# Patient Record
Sex: Male | Born: 1965
Health system: Southern US, Community
[De-identification: ages and names within clinical notes are randomized; demographics above are authoritative.]

## PROBLEM LIST (undated history)

## (undated) ENCOUNTER — Emergency Department (HOSPITAL_COMMUNITY): Admission: EM | Payer: 59 | Source: Home / Self Care

## (undated) ENCOUNTER — Emergency Department (HOSPITAL_COMMUNITY): Payer: 59 | Source: Home / Self Care

## (undated) DIAGNOSIS — D869 Sarcoidosis, unspecified: Secondary | ICD-10-CM

## (undated) DIAGNOSIS — T7840XA Allergy, unspecified, initial encounter: Secondary | ICD-10-CM

## (undated) DIAGNOSIS — G4733 Obstructive sleep apnea (adult) (pediatric): Secondary | ICD-10-CM

## (undated) DIAGNOSIS — IMO0002 Reserved for concepts with insufficient information to code with codable children: Secondary | ICD-10-CM

## (undated) DIAGNOSIS — E119 Type 2 diabetes mellitus without complications: Secondary | ICD-10-CM

## (undated) DIAGNOSIS — Q6650 Congenital pes planus, unspecified foot: Secondary | ICD-10-CM

## (undated) DIAGNOSIS — E785 Hyperlipidemia, unspecified: Secondary | ICD-10-CM

## (undated) DIAGNOSIS — I10 Essential (primary) hypertension: Secondary | ICD-10-CM

## (undated) DIAGNOSIS — I499 Cardiac arrhythmia, unspecified: Secondary | ICD-10-CM

## (undated) DIAGNOSIS — J45909 Unspecified asthma, uncomplicated: Secondary | ICD-10-CM

## (undated) DIAGNOSIS — R269 Unspecified abnormalities of gait and mobility: Secondary | ICD-10-CM

## (undated) DIAGNOSIS — Z9989 Dependence on other enabling machines and devices: Secondary | ICD-10-CM

## (undated) DIAGNOSIS — M25569 Pain in unspecified knee: Secondary | ICD-10-CM

## (undated) DIAGNOSIS — M1990 Unspecified osteoarthritis, unspecified site: Secondary | ICD-10-CM

## (undated) DIAGNOSIS — K219 Gastro-esophageal reflux disease without esophagitis: Secondary | ICD-10-CM

## (undated) DIAGNOSIS — D649 Anemia, unspecified: Secondary | ICD-10-CM

## (undated) DIAGNOSIS — G4730 Sleep apnea, unspecified: Secondary | ICD-10-CM

## (undated) HISTORY — DX: Cardiac arrhythmia, unspecified: I49.9

## (undated) HISTORY — DX: Unspecified asthma, uncomplicated: J45.909

## (undated) HISTORY — PX: KNEE SURGERY: SHX244

## (undated) HISTORY — DX: Hyperlipidemia, unspecified: E78.5

## (undated) HISTORY — DX: Anemia, unspecified: D64.9

## (undated) HISTORY — DX: Pain in unspecified knee: M25.569

## (undated) HISTORY — DX: Reserved for concepts with insufficient information to code with codable children: IMO0002

## (undated) HISTORY — DX: Unspecified osteoarthritis, unspecified site: M19.90

## (undated) HISTORY — DX: Obstructive sleep apnea (adult) (pediatric): Z99.89

## (undated) HISTORY — DX: Congenital pes planus, unspecified foot: Q66.50

## (undated) HISTORY — DX: Obstructive sleep apnea (adult) (pediatric): G47.33

## (undated) HISTORY — DX: Sleep apnea, unspecified: G47.30

## (undated) HISTORY — DX: Allergy, unspecified, initial encounter: T78.40XA

## (undated) HISTORY — DX: Sarcoidosis, unspecified: D86.9

## (undated) HISTORY — DX: Essential (primary) hypertension: I10

## (undated) HISTORY — DX: Unspecified abnormalities of gait and mobility: R26.9

## (undated) HISTORY — DX: Gastro-esophageal reflux disease without esophagitis: K21.9

---

## 1983-03-22 HISTORY — PX: NOSE SURGERY: SHX723

## 1999-01-13 ENCOUNTER — Emergency Department (HOSPITAL_COMMUNITY): Admission: EM | Admit: 1999-01-13 | Discharge: 1999-01-13 | Payer: Self-pay | Admitting: Emergency Medicine

## 1999-01-13 ENCOUNTER — Encounter: Payer: Self-pay | Admitting: Emergency Medicine

## 1999-10-24 ENCOUNTER — Emergency Department (HOSPITAL_COMMUNITY): Admission: EM | Admit: 1999-10-24 | Discharge: 1999-10-24 | Payer: Self-pay | Admitting: Emergency Medicine

## 1999-10-24 ENCOUNTER — Encounter: Payer: Self-pay | Admitting: Emergency Medicine

## 1999-10-25 ENCOUNTER — Encounter: Payer: Self-pay | Admitting: Emergency Medicine

## 1999-11-04 ENCOUNTER — Ambulatory Visit (HOSPITAL_COMMUNITY): Admission: RE | Admit: 1999-11-04 | Discharge: 1999-11-04 | Payer: Self-pay | Admitting: Critical Care Medicine

## 1999-11-04 ENCOUNTER — Encounter: Payer: Self-pay | Admitting: Critical Care Medicine

## 1999-11-04 ENCOUNTER — Encounter (INDEPENDENT_AMBULATORY_CARE_PROVIDER_SITE_OTHER): Payer: Self-pay | Admitting: *Deleted

## 1999-11-08 ENCOUNTER — Ambulatory Visit (HOSPITAL_COMMUNITY): Admission: RE | Admit: 1999-11-08 | Discharge: 1999-11-08 | Payer: Self-pay | Admitting: Critical Care Medicine

## 2001-05-21 ENCOUNTER — Ambulatory Visit (HOSPITAL_BASED_OUTPATIENT_CLINIC_OR_DEPARTMENT_OTHER): Admission: RE | Admit: 2001-05-21 | Discharge: 2001-05-21 | Payer: Self-pay | Admitting: Pulmonary Disease

## 2001-12-21 ENCOUNTER — Ambulatory Visit (HOSPITAL_BASED_OUTPATIENT_CLINIC_OR_DEPARTMENT_OTHER): Admission: RE | Admit: 2001-12-21 | Discharge: 2001-12-21 | Payer: Self-pay | Admitting: Critical Care Medicine

## 2002-07-05 ENCOUNTER — Ambulatory Visit (HOSPITAL_COMMUNITY): Admission: RE | Admit: 2002-07-05 | Discharge: 2002-07-05 | Payer: Self-pay | Admitting: Internal Medicine

## 2002-07-05 ENCOUNTER — Encounter: Payer: Self-pay | Admitting: Internal Medicine

## 2004-01-19 ENCOUNTER — Encounter: Admission: RE | Admit: 2004-01-19 | Discharge: 2004-01-19 | Payer: Self-pay | Admitting: Cardiology

## 2004-09-23 ENCOUNTER — Ambulatory Visit: Payer: Self-pay | Admitting: Internal Medicine

## 2005-10-25 ENCOUNTER — Emergency Department (HOSPITAL_COMMUNITY): Admission: EM | Admit: 2005-10-25 | Discharge: 2005-10-25 | Payer: Self-pay | Admitting: Family Medicine

## 2005-11-15 ENCOUNTER — Ambulatory Visit (HOSPITAL_COMMUNITY): Admission: RE | Admit: 2005-11-15 | Discharge: 2005-11-15 | Payer: Self-pay | Admitting: Nephrology

## 2005-11-22 ENCOUNTER — Ambulatory Visit (HOSPITAL_COMMUNITY): Admission: RE | Admit: 2005-11-22 | Discharge: 2005-11-22 | Payer: Self-pay | Admitting: Nephrology

## 2006-07-12 ENCOUNTER — Ambulatory Visit (HOSPITAL_COMMUNITY): Admission: RE | Admit: 2006-07-12 | Discharge: 2006-07-12 | Payer: Self-pay | Admitting: Nephrology

## 2006-08-23 ENCOUNTER — Emergency Department (HOSPITAL_COMMUNITY): Admission: EM | Admit: 2006-08-23 | Discharge: 2006-08-23 | Payer: Self-pay | Admitting: Emergency Medicine

## 2007-01-23 ENCOUNTER — Ambulatory Visit (HOSPITAL_COMMUNITY): Admission: RE | Admit: 2007-01-23 | Discharge: 2007-01-23 | Payer: Self-pay | Admitting: Nephrology

## 2007-09-03 ENCOUNTER — Emergency Department (HOSPITAL_COMMUNITY): Admission: EM | Admit: 2007-09-03 | Discharge: 2007-09-03 | Payer: Self-pay | Admitting: Family Medicine

## 2008-05-30 ENCOUNTER — Ambulatory Visit (HOSPITAL_COMMUNITY): Admission: RE | Admit: 2008-05-30 | Discharge: 2008-05-30 | Payer: Self-pay | Admitting: Nephrology

## 2008-11-13 ENCOUNTER — Emergency Department (HOSPITAL_COMMUNITY): Admission: EM | Admit: 2008-11-13 | Discharge: 2008-11-14 | Payer: Self-pay | Admitting: Emergency Medicine

## 2008-12-26 ENCOUNTER — Ambulatory Visit (HOSPITAL_COMMUNITY): Admission: RE | Admit: 2008-12-26 | Discharge: 2008-12-26 | Payer: Self-pay | Admitting: Nephrology

## 2009-01-15 ENCOUNTER — Ambulatory Visit: Payer: Self-pay | Admitting: Sports Medicine

## 2009-01-15 DIAGNOSIS — M25569 Pain in unspecified knee: Secondary | ICD-10-CM

## 2009-01-15 DIAGNOSIS — Q6650 Congenital pes planus, unspecified foot: Secondary | ICD-10-CM | POA: Insufficient documentation

## 2009-01-15 DIAGNOSIS — R269 Unspecified abnormalities of gait and mobility: Secondary | ICD-10-CM | POA: Insufficient documentation

## 2009-01-15 DIAGNOSIS — G8929 Other chronic pain: Secondary | ICD-10-CM | POA: Insufficient documentation

## 2009-03-12 ENCOUNTER — Emergency Department (HOSPITAL_COMMUNITY): Admission: EM | Admit: 2009-03-12 | Discharge: 2009-03-12 | Payer: Self-pay | Admitting: Emergency Medicine

## 2009-04-28 ENCOUNTER — Ambulatory Visit: Payer: Self-pay | Admitting: Sports Medicine

## 2009-05-04 ENCOUNTER — Encounter: Admission: RE | Admit: 2009-05-04 | Discharge: 2009-05-04 | Payer: Self-pay | Admitting: Sports Medicine

## 2009-05-05 ENCOUNTER — Encounter: Payer: Self-pay | Admitting: Sports Medicine

## 2009-05-05 DIAGNOSIS — IMO0002 Reserved for concepts with insufficient information to code with codable children: Secondary | ICD-10-CM | POA: Insufficient documentation

## 2009-05-11 ENCOUNTER — Inpatient Hospital Stay (HOSPITAL_COMMUNITY): Admission: RE | Admit: 2009-05-11 | Discharge: 2009-05-12 | Payer: Self-pay | Admitting: Orthopedic Surgery

## 2009-05-11 ENCOUNTER — Ambulatory Visit: Payer: Self-pay | Admitting: Internal Medicine

## 2009-06-09 ENCOUNTER — Encounter: Admission: RE | Admit: 2009-06-09 | Discharge: 2009-07-15 | Payer: Self-pay | Admitting: Internal Medicine

## 2009-06-22 ENCOUNTER — Ambulatory Visit: Payer: Self-pay | Admitting: Critical Care Medicine

## 2009-06-22 DIAGNOSIS — D869 Sarcoidosis, unspecified: Secondary | ICD-10-CM

## 2009-06-22 DIAGNOSIS — G4730 Sleep apnea, unspecified: Secondary | ICD-10-CM | POA: Insufficient documentation

## 2009-06-22 DIAGNOSIS — K219 Gastro-esophageal reflux disease without esophagitis: Secondary | ICD-10-CM | POA: Insufficient documentation

## 2009-06-22 DIAGNOSIS — I10 Essential (primary) hypertension: Secondary | ICD-10-CM

## 2009-07-24 ENCOUNTER — Ambulatory Visit: Payer: Self-pay | Admitting: Critical Care Medicine

## 2010-02-17 ENCOUNTER — Telehealth (INDEPENDENT_AMBULATORY_CARE_PROVIDER_SITE_OTHER): Payer: Self-pay | Admitting: *Deleted

## 2010-02-17 ENCOUNTER — Ambulatory Visit: Payer: Self-pay | Admitting: Critical Care Medicine

## 2010-03-30 ENCOUNTER — Ambulatory Visit
Admission: RE | Admit: 2010-03-30 | Discharge: 2010-03-30 | Payer: Self-pay | Source: Home / Self Care | Attending: Critical Care Medicine | Admitting: Critical Care Medicine

## 2010-04-11 ENCOUNTER — Encounter: Payer: Self-pay | Admitting: Sports Medicine

## 2010-04-22 ENCOUNTER — Other Ambulatory Visit: Payer: Self-pay | Admitting: Internal Medicine

## 2010-04-22 ENCOUNTER — Ambulatory Visit: Payer: Self-pay

## 2010-04-22 DIAGNOSIS — R52 Pain, unspecified: Secondary | ICD-10-CM

## 2010-04-22 NOTE — Assessment & Plan Note (Signed)
Summary: NP/KNEE PAIN/KH   Vital Signs:  Patient profile:   45 year old male Height:      67 inches Weight:      193 pounds BMI:     30.34 BP sitting:   126 / 87  Vitals Entered By: Lillia Pauls CMA (January 15, 2009 1:41 PM)  History of Present Illness: Pt presents as a new patient for evaluation of left knee pain that he has had for 2 years, worsening for the past 6 months. No prior injury except for possibly stepping in a hole years ago. Has intermittent medial left knee pain with intermittent effusions and locking. He has used heat, ibuprofen and celebrex which have been mildly helpful through the years. Not using Celebrex at this time. He has difficulty with squatting and pivoting. Has had instances when his leg would lock up and he would have to shake it for 30 seconds or more to unlock it. Has used a knee sleeve in the past which did not help very much. This is decreasing his quality of life. He works in Public affairs consultant at [employer].   Physical Exam  General:  alert, well-developed, and well-nourished.   Head:  normocephalic and atraumatic.   Msk:  Knee: LEFT Normal to inspection with no erythema or effusion or obvious bony abnormalities. Palpation showed medial left joint line tenderness to palpation both anteriorly and posteriorly.  No patellar tenderness or condyle tenderness. ROM normal lacks flexion beyond 105 degrees and terminal extension. Ligaments with solid consistent endpoints including ACL, PCL, LCL, MCL. Positive Mcmurray's and provocative meniscal tests. Non painful patellar compression. Patellar and quadriceps tendons unremarkable. Hamstring and quadriceps strength is normal.   Knee: RIGHT Normal to inspection with no erythema or effusion or obvious bony abnormalities. Palpation normal with no warmth or joint line tenderness or patellar tenderness or condyle tenderness. ROM normal in flexion and extension and lower leg rotation. Ligaments with solid  consistent endpoints including ACL, PCL, LCL, MCL. Negative Mcmurray's and provocative meniscal tests. Non painful patellar compression. Patellar and quadriceps tendons unremarkable. Hamstring and quadriceps strength is normal.   Bilateral pes planus with significant midfoot breakdown and pronation while walking.      Impression & Recommendations:  Problem # 1:  KNEE PAIN, LEFT (ICD-719.46) Assessment New  Likley due to medial meniscus tear. Discussed treatment options with the patient including nothing, conservative with CSI and therapy, or MRI and surgery. Patient chose conservative tx with steroid injection, Don Joy brace and knee stregthening exercises.  Consent obtained and verified. Sterile betadine prep. Furthur cleansed with alcohol. Topical analgesic spray: Ethyl chloride. Joint: Left knee Approached in typical fashion with: laterally Completed without difficulty Meds: 6 cc of 0.5 % marcaine and 1 cc of 40mg  of Kenalog Needle: 22 gauge Aftercare instructions and Red flags advised.  Given straight leg raise exercises to do daily. 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 2)  Do the straight leg raises and the  small bends, 3 sets of 10 working up to 3 sets of 30.  3)  Use the new shoe inserts with the arch pads. 4)  We will see you back in 4-6 weeks. 5)  If you get swelling ice the knee for 20 minutes.

## 2010-04-22 NOTE — Assessment & Plan Note (Signed)
Summary: Pulmonary OV   CC:  1 month follow up.  Pt states breathing has improved since starting on advair.  Pt states he still have an occasional dry cough but states overall its has improved greatly.  Denies wheezing and chest tightness.Collin Lopez  History of Present Illness: Pulmonary OV       This is a 45 year old man who presents for follow-up Pulmonary visit.  The patient presents with cough, but has no history of fever, fatigue, failure to thrive, weight loss, night sweats, nosebleeds, easy bruising, heartburn, nausea/vomiting, unable to take oral fluids/nourishment, chest pain, shortness of breath, shortness of breath with exertion, hemoptysis, joint swelling, leg swelling, peripheral edema, and URI symptoms.  The patient comes in today for follow-up of asthma, bronchitis, and sarcoidosis.  Since the last visit, the patient feels their symptoms are getting worse.  Evaluation since the last visit (see reports for full details) include CXR.    Pt not seen since 2005.  Had L knee surgery 2/11.  arthoscopic surgery.  Pt had complication of negative pressure pulmonary edema postop.   CXR did clear in 24hrs but the pt not same since.  Pt with ongoing chest congestion.  Now has more phlegm to produce.   Pt on cpap 14cmh20.  Jul 24, 2009 2:41 PM Is doing better.  not much cough,  no wheeze,  no new issues. Pt denies any significant sore throat, nasal congestion or excess secretions, fever, chills, sweats, unintended weight loss, pleurtic or exertional chest pain, orthopnea PND, or leg swelling Pt denies any increase in rescue therapy over baseline, denies waking up needing it or having any early am or nocturnal exacerbations of coughing/wheezing/or dyspnea.   Preventive Screening-Counseling & Management  Alcohol-Tobacco     Smoking Status: quit > 6 months  Current Medications (verified): 1)  Nexium 40 Mg Cpdr (Esomeprazole Magnesium) .... Once Daily 2)  Allegra-D 12 Hour 60-120 Mg Xr12h-Tab  (Fexofenadine-Pseudoephedrine) .... Once Daily 3)  Losartan Potassium 50 Mg Tabs (Losartan Potassium) .... Once Daily 4)  Hydrochlorothiazide 25 Mg Tabs (Hydrochlorothiazide) .... Once Daily 5)  Advair Diskus 250-50 Mcg/dose  Misc (Fluticasone-Salmeterol) .... One Puff Twice Daily 6)  Ibuprofen 200 Mg Tabs (Ibuprofen) .... As Directed On Bottle 7)  Proair Hfa 108 (90 Base) Mcg/act Aers (Albuterol Sulfate) .... Take One To Two Puff Every 4 Hours As Needed 8)  Cpap .Collin KitchenMarland KitchenMarland Lopez 14 Cmh20  Allergies (verified): No Known Drug Allergies  Past History:  Past medical, surgical, family and social histories (including risk factors) reviewed, and no changes noted (except as noted below).  Past Medical History: Reviewed history from 06/22/2009 and no changes required. 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 left knee surgery  Family History: Reviewed history from 06/22/2009 and no changes required. Heart disease-father cancer-mother  Social History: Reviewed history from 06/22/2009 and no changes required. Patient states former smoker.  1ppd x 2 years.  Quit in 1990s. 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    Vital Signs:  Patient profile:   45 year old male Height:      67 inches Weight:      188 pounds BMI:     29.55 O2 Sat:      98 % on Room air Temp:     97.8 degrees F oral Pulse rate:   86 / minute BP sitting:   124 / 82  (right arm) Cuff size:   regular  Vitals Entered By: [name] RN (Jul 24, 2009 2:24 PM)  O2 Flow:  Room air  CC: 1 month follow up.  Pt states breathing has improved since starting on advair.  Pt states he still have an occasional dry cough but states overall its has improved greatly.  Denies wheezing and chest tightness. Comments Medications reviewed with patient Daytime contact number verified with patient. [name] RN  Jul 24, 2009 2:24 PM    Physical Exam  Additional Exam:  Gen: Pleasant, well-nourished, in no distress,  normal affect ENT: No lesions,  mouth clear,  oropharynx clear, no postnasal drip Neck: No JVD, no TMG, no carotid bruits Lungs: No use of accessory muscles, no dullness to percussion,  Cardiovascular: RRR, heart sounds normal, no murmur or gallops, no peripheral edema Abdomen: soft and NT, no HSM,  BS normal Musculoskeletal: No deformities, no cyanosis or clubbing Neuro: alert, non focal Skin: Warm, no lesions or rashes    Impression & Recommendations:  Problem # 1:  OBSTRUCTIVE CHRONIC BRONCHITIS WITHOUT EXACERBAT (ICD-491.20) Assessment Improved Obstructive asthma plan cont inhaled meds  Complete Medication List: 1)  Nexium 40 Mg Cpdr (Esomeprazole magnesium) .... Once daily 2)  Allegra-d 12 Hour 60-120 Mg Xr12h-tab (Fexofenadine-pseudoephedrine) .... Once daily 3)  Losartan Potassium 50 Mg Tabs (Losartan potassium) .... Once daily 4)  Hydrochlorothiazide 25 Mg Tabs (Hydrochlorothiazide) .... Once daily 5)  Advair Diskus 250-50 Mcg/dose Misc (Fluticasone-salmeterol) .... One puff twice daily 6)  Ibuprofen 200 Mg Tabs (Ibuprofen) .... As directed on bottle 7)   Proair Hfa 108 (90 Base) Mcg/act Aers (Albuterol sulfate) .... Take one to two puff every 4 hours as needed 8)  Cpap  .Collin KitchenMarland KitchenMarland Lopez 14 cmh20  Other Orders: Est. Patient Level III (21308)  Patient Instructions: 1)  No change in medications 2)  Return 4-5 months

## 2010-04-22 NOTE — Assessment & Plan Note (Signed)
Summary: Pulmonary OV   CC:  Follow up.  Pt states he had surgery on left leg in Feb 2011 and was told he had "fluid on lungs."  Pt c/o cough-prod in the mornings with green mucus and chest congestion x2-3 wks..  History of Present Illness: Pulmonary OV       This is a 45 year old man who presents for follow-up Pulmonary visit.  The patient presents with cough, but has no history of fever, fatigue, failure to thrive, weight loss, night sweats, nosebleeds, easy bruising, heartburn, nausea/vomiting, unable to take oral fluids/nourishment, chest pain, shortness of breath, shortness of breath with exertion, hemoptysis, joint swelling, leg swelling, peripheral edema, and URI symptoms.  The patient comes in today for follow-up of asthma, bronchitis, and sarcoidosis.  Since the last visit, the patient feels their symptoms are getting worse.  Evaluation since the last visit (see reports for full details) include CXR.    Pt not seen since 2005.  Had L knee surgery 2/11.  arthoscopic surgery.  Pt had complication of negative pressure pulmonary edema postop.   CXR did clear in 24hrs but the pt not same since.  Pt with ongoing chest congestion.  Now has more phlegm to produce.   Pt on cpap 14cmh20.    Preventive Screening-Counseling & Management  Alcohol-Tobacco     Smoking Status: quit  Current Medications (verified): 1)  Nexium 40 Mg Cpdr (Esomeprazole Magnesium) .... Once Daily 2)  Allegra-D 12 Hour 60-120 Mg Xr12h-Tab (Fexofenadine-Pseudoephedrine) .... Once Daily 3)  Losartan Potassium 50 Mg Tabs (Losartan Potassium) .... Once Daily 4)  Hydrochlorothiazide 25 Mg Tabs (Hydrochlorothiazide) .... Once Daily 5)  Advair Diskus 100-50 Mcg/dose Aepb (Fluticasone-Salmeterol) .Marland Kitchen.. 1 Puff Two Times A Day 6)  Ibuprofen 200 Mg Tabs (Ibuprofen) .... As Directed On Bottle 7)  Ventolin Hfa 108 (90 Base) Mcg/act Aers (Albuterol Sulfate) .... 2 Puffs Every 4 Hours As Needed  Allergies (verified): No Known  Drug Allergies  Past History:  Past medical, surgical, family and social histories (including risk factors) reviewed for relevance to current acute and chronic problems.  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 Heart disease-father cancer-mother  Social History: Reviewed history and no changes required. Patient states former smoker.  1ppd x 2 years.  Quit in 1990s. 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  The patient denies shortness of breath with activity, shortness of breath at rest, coughing up blood, chest pain, acid heartburn, indigestion, weight change, abdominal pain, difficulty swallowing, sore throat, tooth/dental problems, headaches, sneezing, itching, ear ache, anxiety, depression, hand/feet swelling, rash, and fever.    Vital Signs:  Patient profile:   45 year old male Height:      67 inches Weight:      193.50 pounds BMI:     30.42 O2 Sat:      99 % on Room air Temp:     98.0 degrees F oral Pulse rate:   99 / minute BP sitting:   114 / 70  (left arm) Cuff  size:   large  Vitals Entered By: [name] RN (June 22, 2009 3:59 PM)  O2 Flow:  Room air CC: Follow up.  Pt states he had surgery on left leg in Feb 2011 and was told he had "fluid on lungs."  Pt c/o cough-prod in the mornings with green mucus and chest congestion x2-3 wks. Comments Medications reviewed with patient Daytime  contact number verified with patient. [name] RN  June 22, 2009 4:00 PM    Physical Exam  Additional Exam:  Gen: Pleasant, well-nourished, in no distress,  normal affect ENT: No lesions,  mouth clear,  oropharynx clear, no postnasal drip Neck: No JVD, no TMG, no carotid bruits Lungs: No use of accessory muscles, no dullness to percussion, expir wheeze  Cardiovascular: RRR, heart sounds normal, no murmur or gallops, no peripheral edema Abdomen: soft and NT, no HSM,  BS normal Musculoskeletal: No deformities, no cyanosis or clubbing Neuro: alert, non focal Skin: Warm, no lesions or rashes    Pulmonary Function Test Date: 06/22/2009 4:23 PM Gender: Male  Pre-Spirometry FVC    Value: 2.88 L/min   % Pred: 75.40 % FEV1    Value: 2.42 L     Pred: 3.09 L     % Pred: 78.20 % FEV1/FVC  Value: 83.91 %     % Pred: 103.80 %  Impression & Recommendations:  Problem # 1:  OBSTRUCTIVE CHRONIC BRONCHITIS WITHOUT EXACERBAT (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 Patient Level V (41324) HFA Instruction (40102)  Problem # 2:  PULMONARY SARCOIDOSIS (ICD-135) Assessment: Unchanged stable pulmonary sarcoidosis on cxr and exam no systemic long term steroids  Problem # 3:  SLEEP APNEA (ICD-780.57) Assessment: Unchanged severe sleep apnea plan  cont cpap  Medications Added to Medication List This Visit: 1)  Nexium 40 Mg Cpdr (Esomeprazole magnesium) .... Once daily 2)  Allegra-d 12 Hour 60-120 Mg Xr12h-tab (Fexofenadine-pseudoephedrine) .... Once daily 3)  Losartan Potassium 50 Mg Tabs (Losartan potassium) .... Once daily 4)  Hydrochlorothiazide 25 Mg Tabs (Hydrochlorothiazide) .... Once daily 5)  Advair Diskus 100-50 Mcg/dose Aepb (Fluticasone-salmeterol) .Marland Kitchen.. 1 puff two times a day 6)  Advair Diskus  250-50 Mcg/dose Misc (Fluticasone-salmeterol) .... One puff twice daily 7)  Ibuprofen 200 Mg Tabs (Ibuprofen) .... As directed on bottle 8)  Proair Hfa 108 (90 Base) Mcg/act Aers (Albuterol sulfate) .... Take one to two puff every 4 hours as needed 9)  Ventolin Hfa 108 (90 Base) Mcg/act Aers (Albuterol sulfate) .... 2 puffs every 4 hours as needed 10)  Cpap  .Marland KitchenMarland KitchenMarland Kitchen 14 cmh20 11)  Prednisone 10 Mg Tabs (Prednisone) .... Take as directed 4 each am x3days, 3 x 3days, 2 x 3days, 1 x 3days then stop  Complete Medication List: 1)  Nexium 40 Mg Cpdr (Esomeprazole magnesium) .... Once daily 2)  Allegra-d 12 Hour 60-120 Mg Xr12h-tab (Fexofenadine-pseudoephedrine) .... Once daily 3)  Losartan Potassium 50 Mg Tabs (Losartan potassium) .... Once daily 4)  Hydrochlorothiazide 25 Mg Tabs (Hydrochlorothiazide) .... Once daily 5)  Advair Diskus 250-50 Mcg/dose Misc (Fluticasone-salmeterol) .... One puff twice daily 6)  Ibuprofen 200 Mg Tabs (Ibuprofen) .... As directed on bottle 7)  Proair Hfa 108 (90 Base) Mcg/act Aers (Albuterol sulfate) .... Take one to two puff every 4 hours as needed 8)  Cpap  .Marland KitchenMarland KitchenMarland Kitchen 14 cmh20  Other Orders: Depo- Medrol 80mg  (J1040) Admin of Therapeutic Inj  intramuscular or subcutaneous (04540)  Patient Instructions: 1)  Increase Advair to 250/50 one puff twice daily 2)  A depomedrol injection 80mg  will be given 3)  Ventolin/Proair as needed 4)  Return one month Prescriptions: PROAIR HFA 108 (90 BASE) MCG/ACT AERS (ALBUTEROL SULFATE) Take one to two puff every 4 hours as needed  #1 x 6   Entered and Authorized by:   Storm Frisk MD   Signed by:   Storm Frisk MD on 06/22/2009   Method used:   Electronically to        [facility]* (retail)       [address]       [address]       [place], [place]  [address]       Ph: [phone]       Fax: [phone]   RxID:   [id number] PREDNISONE 10 MG  TABS (PREDNISONE) Take as directed  4 each am x3days, 3 x 3days, 2 x 3days, 1 x 3days then stop  #30 x 0   Entered and Authorized by:   Storm Frisk MD   Signed by:   Storm Frisk MD on 06/22/2009   Method used:   Electronically to        [facility]* (retail)       [address]       [address] Shipping[address]mail[address]       [place], [place]  [address]       Ph: [phone]       Fax: [phone]   RxID:   [id number] ADVAIR DISKUS 250-50 MCG/DOSE  MISC (FLUTICASONE-SALMETEROL) One puff twice daily  #1 x 6   Entered and Authorized by:   Storm Frisk MD   Signed by:   Storm Frisk MD on 06/22/2009   Method used:   Electronically to        [facility]* (retail)       [address]       [address]       [place], [place]  [address]       Ph: [phone]       Fax: [phone]   RxID:   [id number]  Called [facility] and cancelled rx for prednisone per PW. Vernie Murders  June 22, 2009 4:45 PM   Immunization History:  Influenza Immunization History:    Influenza:  historical (01/19/2009)  Pneumovax Immunization History:    Pneumovax:  historical (05/12/2009)    CardioPerfect Spirometry  ID: [id number] Patient: Collin Lopez, Collin Lopez DOB: Dec 10, 1965 Age: 45 Years Old Sex: Male Race: Black Height: 67 Weight: 193.50 Status: Confirmed Recorded: 06/22/2009 4:23 PM  Parameter  Measured Predicted %Predicted FVC     2.88        3.82        75.40 FEV1     2.42        3.09        78.20 FEV1%   83.91        80.83        103.80 PEF    7.27        8.25        88.10   Comments: Mild restriction, no obstruction  Interpretation: Pre: FVC= 2.88L FEV1= 2.42L FEV1%= 83.9% 2.42/2.88 FEV1/FVC (06/22/2009 4:24:56 PM), Mild restriction  Medication Administration  Injection # 1:    Medication: Depo- Medrol 80mg     Diagnosis: PULMONARY SARCOIDOSIS (ICD-135)    Route: IM    Site: LUOQ gluteus    Exp Date: 11/2011    Lot #:  [id number]    Mfr: [employer]    Patient tolerated injection without complications    Given by: [name] RN (June 22, 2009 4:50 PM)  Orders Added: 1)  Spirometry w/Graph [94010] 2)  Est. Patient Level V [04540] 3)  HFA Instruction [98119] 4)  Depo- Medrol 80mg  [J1040] 5)  Admin of Therapeutic Inj  intramuscular or subcutaneous [14782]

## 2010-04-22 NOTE — Assessment & Plan Note (Signed)
Summary: Pulmonary OV   CC:  2 month follow up.  Pt states breathing is much better but still having some wheezing, chest tightness, sinsus pressure, and nonprod cough.  .  History of Present Illness: Pulmonary OV       This is a 45 year old man who presents for follow-up Pulmonary visit.  The patient presents with cough, but has no history of fever, fatigue, failure to thrive, weight loss, night sweats, nosebleeds, easy bruising, heartburn, nausea/vomiting, unable to take oral fluids/nourishment, chest pain, shortness of breath, shortness of breath with exertion, hemoptysis, joint swelling, leg swelling, peripheral edema, and URI symptoms.  The patient comes in today for follow-up of asthma, bronchitis, and sarcoidosis.  Since the last visit, the patient feels their symptoms are getting worse.  Evaluation since the last visit (see reports for full details) include CXR.    Pt not seen since 2005.  Had L knee surgery 2/11.  arthoscopic surgery.  Pt had complication of negative pressure pulmonary edema postop.   CXR did clear in 24hrs but the pt not same since.  Pt with ongoing chest congestion.  Now has more phlegm to produce.   Pt on cpap 14cmh20.  Jul 24, 2009 2:41 PM Is doing better.  not much cough,  no wheeze,  no new issues. Pt denies any significant sore throat, nasal congestion or excess secretions, fever, chills, sweats, unintended weight loss, pleurtic or exertional chest pain, orthopnea PND, or leg swelling Pt denies any increase in rescue therapy over baseline, denies waking up needing it or having any early am or nocturnal exacerbations of coughing/wheezing/or dyspnea. February 17, 2010 3:50 PM Notes more congestion and cough.  ?two weeks ago.  Mucus is green out of mouth and chest .  No real fever, ?some low grade fever before. Pt is sleepy at lunch.  Dyspnea is worse as well. No f/c/s.   Pt denies any significant sore throat, nasal congestion or excess secretions, fever, chills,  sweats, unintended weight loss, pleurtic or exertional chest pain, orthopnea PND, or leg swelling Pt denies any increase in rescue therapy over baseline, denies waking up needing it or having any early am or nocturnal exacerbations of coughing/wheezing/or dyspnea.   March 30, 2010 4:56 PM was given steroids and abx and is better  still with sinus pressure and coughing . notes some wheezing.  mucus is minimal out of the nose notes some dyspnea with exertion.     Current Medications (verified): 1)  Nexium 40 Mg Cpdr (Esomeprazole Magnesium) .... Once Daily 2)  Losartan Potassium 50 Mg Tabs (Losartan Potassium) .... Once Daily 3)  Hydrochlorothiazide 25 Mg Tabs (Hydrochlorothiazide) .... Once Daily 4)  Advair Diskus 250-50 Mcg/dose  Misc (Fluticasone-Salmeterol) .... One Puff Twice Daily 5)  Ibuprofen 200 Mg Tabs (Ibuprofen) .... As Directed On Bottle 6)  Proair Hfa 108 (90 Base) Mcg/act Aers (Albuterol Sulfate) .... Take One To Two Puff Every 4 Hours As Needed 7)  Cpap .Marland KitchenMarland KitchenMarland Kitchen 14 Cmh20 8)  Fluticasone Propionate 50 Mcg/act Susp (Fluticasone Propionate) .... Two Sprays Each Nostril Daily  Allergies (verified): No Known Drug Allergies  Past History:  Past medical, surgical, family and social histories (including risk factors) reviewed, and no changes noted (except as noted below).  Past Medical History: Reviewed history from 06/22/2009 and no changes required. 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 left knee surgery  Family History: Reviewed history from 06/22/2009 and no changes required. Heart disease-father cancer-mother  Social History: Reviewed history from 06/22/2009 and no changes required. Patient states former  smoker.  1ppd x 2 years.  Quit in 1990s. 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  The patient denies shortness of breath at rest, non-productive cough, coughing up blood, chest pain, irregular heartbeats, acid heartburn, indigestion, loss of appetite, weight change, abdominal pain, difficulty swallowing, sore throat, tooth/dental problems, headaches, sneezing, itching, ear ache, anxiety, depression, hand/feet swelling, joint stiffness or pain, rash, change in color of mucus, and fever.    Vital Signs:  Patient profile:   44 year old male Height:      67 inches Weight:      195.25 pounds BMI:     30.69 O2 Sat:      99 % on Room air Temp:     98.3 degrees F oral Pulse rate:   98 / minute BP sitting:   142 / 88  (right arm) Cuff size:   regular  Vitals Entered By: [name] RN (March 30, 2010 4:49 PM)  O2 Flow:  Room air CC: 2 month follow up.  Pt states breathing is much better but still having some wheezing, chest tightness, sinsus pressure, nonprod cough.   Comments Medications reviewed with patient Daytime contact number verified with patient. [name] RN  March 30, 2010 4:51 PM    Physical Exam  Additional Exam:  Gen: Pleasant, well-nourished, in no distress,  normal affect ENT: erythematous nares, no purulence Neck: No JVD, no TMG, no carotid bruits Lungs: No use of accessory muscles, no dullness to percussion,  Cardiovascular: RRR, heart sounds normal, no murmur or gallops, no peripheral edema Abdomen: soft and NT, no HSM,  BS normal Musculoskeletal: No deformities, no cyanosis or clubbing Neuro: alert, non focal Skin: Warm, no lesions or rashes    Impression & Recommendations:  Problem # 1:  ALLERGIC RHINITIS (ICD-477.9) Assessment Unchanged ongoing allergic  rhinitis plan Trial Astepro two sprays each nostril daily Stay on fluticasone two sprays each nostril daily Stay on Advair one puff twice daily Return 3 months The following medications were removed from the medication list:    Allegra Allergy 180 Mg Tabs (Fexofenadine hcl) ..... Hold for 10days then resume one daily His updated medication list for this problem includes:    Fluticasone Propionate 50 Mcg/act Susp (Fluticasone propionate) .Marland Kitchen..Marland Kitchen Two sprays each nostril daily    Astepro 0.15 % Soln (Azelastine hcl) .Marland Kitchen..Marland Kitchen Two sprays each nostril daily  Orders: Est. 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 Patient Level III (02725)  Medications Added to Medication List This Visit: 1)  Astepro 0.15 % Soln (Azelastine hcl) .... Two sprays each nostril daily  Complete Medication List: 1)  Nexium 40 Mg Cpdr (Esomeprazole magnesium) .... Once daily 2)  Losartan Potassium 50 Mg Tabs (Losartan potassium) .... Once daily 3)  Hydrochlorothiazide 25 Mg Tabs (Hydrochlorothiazide) .... Once daily 4)  Advair Diskus 250-50 Mcg/dose Misc (Fluticasone-salmeterol) .... One puff twice daily 5)  Ibuprofen 200 Mg Tabs (Ibuprofen) .... As directed on bottle 6)  Proair Hfa 108 (90 Base) Mcg/act Aers (Albuterol sulfate) .... Take one  to two puff every 4 hours as needed 7)  Cpap  .Marland KitchenMarland KitchenMarland Kitchen 14 cmh20 8)  Fluticasone Propionate 50 Mcg/act Susp (Fluticasone propionate) .... Two sprays each nostril daily 9)  Astepro 0.15 % Soln (Azelastine hcl) .... Two sprays each nostril daily  Patient Instructions: 1)  Trial Astepro two sprays each nostril daily 2)  Stay on fluticasone two sprays each nostril daily 3)  Stay on Advair one puff twice daily 4)  Return 3 months Prescriptions: ASTEPRO 0.15 % SOLN (AZELASTINE HCL) two sprays each nostril daily  #1 x 6   Entered and Authorized by:   Storm Frisk MD    Signed by:   Storm Frisk MD on 03/30/2010   Method used:   Print then Give to Patient   RxID:   [id number]

## 2010-04-22 NOTE — Letter (Signed)
Summary: *Referral Letter  [facility]  [address]   [place], [place] [address]   Phone: [phone]  Fax: [phone]    05/05/2009 Worthy Rancher, MD [facility] Fax: [phone]   Dear Ferne Reus:  Thank you in advance for agreeing to see my patient:  Collin Lopez [address] [place], [place]  [address]  Phone: [phone]  Reason for Referral: large mesnicus tear of left knee  Procedures Requested: arthroscopic surgery if needed in your opinion  Current Medical Problems: see attached records.  I believe you have seen him in the past.  Very nice gentleman who is ready to move ahead with surgery as he got minimal relief with conservative care.   Thank you again for agreeing to see our patient; please contact us if you have any further questions or need additional information.  Sincerely,  Vincent Gros MD

## 2010-04-22 NOTE — Progress Notes (Signed)
Summary: congestion, cough > OV scheduled  Phone Note Call from Patient Call back at [facility] Phone [phone]   Caller: Patient Call For: wright Summary of Call: Pt states PW told him to call today and make appt as a walk-in, pls advise. Initial call taken by: Darletta Moll,  February 17, 2010 9:18 AM  Follow-up for Phone Call        Called, spoke with pt.  He states he saw Dr. Delford Field in the hallway this morning at [facility] and was told by PW to call office for appt.  Pt c/o chest congestion, prod cough, and sweats.  Cough is prod with green mucus with spots of blood.  States it is a small amount of blood, dark red in color.  Sxs started on Sunday.  OV scheduled for today at 3:20 with PW -- pt aware.  Follow-up by: [name] RN,  February 17, 2010 10:25 AM

## 2010-04-22 NOTE — Assessment & Plan Note (Signed)
Summary: KNEE PAIN,MC   Vital Signs:  Patient profile:   45 year old male BP sitting:   137 / 90  Vitals Entered By: Lillia Pauls CMA (April 28, 2009 1:40 PM)  History of Present Illness: 45 yo M here for 4 month f/u of left knee probable meniscal tear.  Patient reports issues with left knee dating back to when he was a teenager. Progressively worse over past 2 1/2 years. + locking and catching Has had swelling as well. Given intraarticular cortisone injection with  ~30% relief of pain in knee only Using donjoy brace for support and compliant with home quad exercises. Pain worse with squatting and pivoting. Inquiring about surgery or another shot.  Physical Exam  General:  alert, well-developed, and well-nourished.   Msk:  L Knee: Normal to inspection with no erythema or effusion or obvious bony abnormalities. Medial left joint line tenderness to palpation .  No patellar tenderness or condyle tenderness. ROM 5-115 degrees. Ligaments with solid consistent endpoints including ACL, PCL, LCL, MCL. Positive Mcmurray's, thessalys, flexion pinch. Non painful patellar compression. Patellar and quadriceps tendons unremarkable. Hamstring and quadriceps strength is normal.    Impression & Recommendations:  Problem # 1:  KNEE PAIN, LEFT (ICD-719.46) Assessment Deteriorated Not improved with conservative treatment and continues to have mechanical symptoms.  Will order MRI and refer to orthopedics - patient was given the name of Dr. Darrelyn Hillock so would like to see him.  Referral placed.  Orders: MRI without Contrast (MRI w/o Contrast) Orthopedic Surgeon Referral (Ortho Surgeon)  Patient Instructions: 1)  Get the MRI of your left knee and follow up with the orthopedic surgeon after this. 2)  You likely have a cartilage tear in your knee that will need to be removed. 3)  Continue doing the leg exercises and wearing the knee brace for stability. 4)  Follow up with [place] here as  needed. 5)  Your appt for the MRI is on saturday, february 12th at 12 noon at [facility] hospital. Register in admitting at 11:30am. [phone] 6)  Your appt for the orthopaedist is on thurs, february 17th at 8:45am with Dr. Darrelyn Hillock at [facility]. [address] [phone]  Appended Document: KNEE PAIN,MC pt is closterfobic and had to reschedule his appt for mri today. new appt is tmrrw (15th) at [facility], [address] location, at 3 pm. left mssg on pts machine with the info

## 2010-04-22 NOTE — Assessment & Plan Note (Signed)
Summary: Pulmonary OV   CC:  Acute Visit.  chest congestion, prod cough, and sweats - onset Sunday.  Mucus is green with small spot of dark red blood.  Marland Kitchen  History of Present Illness: Pulmonary OV       This is a 45 year old man who presents for follow-up Pulmonary visit.  The patient presents with cough, but has no history of fever, fatigue, failure to thrive, weight loss, night sweats, nosebleeds, easy bruising, heartburn, nausea/vomiting, unable to take oral fluids/nourishment, chest pain, shortness of breath, shortness of breath with exertion, hemoptysis, joint swelling, leg swelling, peripheral edema, and URI symptoms.  The patient comes in today for follow-up of asthma, bronchitis, and sarcoidosis.  Since the last visit, the patient feels their symptoms are getting worse.  Evaluation since the last visit (see reports for full details) include CXR.    Pt not seen since 2005.  Had L knee surgery 2/11.  arthoscopic surgery.  Pt had complication of negative pressure pulmonary edema postop.   CXR did clear in 24hrs but the pt not same since.  Pt with ongoing chest congestion.  Now has more phlegm to produce.   Pt on cpap 14cmh20.  Jul 24, 2009 2:41 PM Is doing better.  not much cough,  no wheeze,  no new issues. Pt denies any significant sore throat, nasal congestion or excess secretions, fever, chills, sweats, unintended weight loss, pleurtic or exertional chest pain, orthopnea PND, or leg swelling Pt denies any increase in rescue therapy over baseline, denies waking up needing it or having any early am or nocturnal exacerbations of coughing/wheezing/or dyspnea. February 17, 2010 3:50 PM Notes more congestion and cough.  ?two weeks ago.  Mucus is green out of mouth and chest .  No real fever, ?some low grade fever before. Pt is sleepy at lunch.  Dyspnea is worse as well. No f/c/s.   Pt denies any significant sore throat, nasal congestion or excess secretions, fever, chills, sweats, unintended  weight loss, pleurtic or exertional chest pain, orthopnea PND, or leg swelling Pt denies any increase in rescue therapy over baseline, denies waking up needing it or having any early am or nocturnal exacerbations of coughing/wheezing/or dyspnea.   Asthma History    Initial Asthma Severity Rating:    Age range: 12+ years    Symptoms: daily    Nighttime Awakenings: 3-4/month    Interferes w/ normal activity: some limitations    SABA use (not for EIB): >2 days/week but not >1X/day    Exacerbations requiring oral systemic steroids: 0-1/year    Asthma Severity Assessment: Moderate Persistent   Current Medications (verified): 1)  Nexium 40 Mg Cpdr (Esomeprazole Magnesium) .... Once Daily 2)  Allegra Allergy 180 Mg Tabs (Fexofenadine Hcl) .... Take 1 Tablet By Mouth Once A Day 3)  Losartan Potassium 50 Mg Tabs (Losartan Potassium) .... Once Daily 4)  Hydrochlorothiazide 25 Mg Tabs (Hydrochlorothiazide) .... Once Daily 5)  Advair Diskus 250-50 Mcg/dose  Misc (Fluticasone-Salmeterol) .... One Puff Twice Daily 6)  Ibuprofen 200 Mg Tabs (Ibuprofen) .... As Directed On Bottle 7)  Proair Hfa 108 (90 Base) Mcg/act Aers (Albuterol Sulfate) .... Take One To Two Puff Every 4 Hours As Needed 8)  Cpap .Marland KitchenMarland KitchenMarland Kitchen 14 Cmh20  Allergies (verified): No Known Drug Allergies  Past History:  Past medical, surgical, family and social histories (including risk factors) reviewed, and no changes noted (except as noted below).  Past Medical History: Reviewed history from 06/22/2009 and no changes required.  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 left knee surgery  Family History: Reviewed history from 06/22/2009 and no changes required. Heart  disease-father cancer-mother  Social History: Reviewed history from 06/22/2009 and no changes required. Patient states former smoker.  1ppd x 2 years.  Quit in 1990s. 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  The patient denies shortness of breath at rest, non-productive cough, coughing up blood, chest pain, irregular heartbeats, acid heartburn, indigestion, loss of appetite, weight change, abdominal pain, difficulty swallowing, sore throat, tooth/dental problems, sneezing, itching, ear ache, anxiety, depression, hand/feet swelling, joint stiffness or pain, rash, and fever.    Vital Signs:  Patient profile:   45 year old male Height:      67 inches Weight:      193.50 pounds BMI:     30.42 O2 Sat:      99 % on Room air Temp:     98.3 degrees F oral Pulse rate:   98 / minute BP sitting:   126 / 70  (right arm) Cuff size:   large  Vitals Entered By: [name] RN (February 17, 2010 3:39 PM)  O2 Flow:  Room air CC: Acute Visit.  chest congestion, prod cough, sweats - onset Sunday.  Mucus is green with small spot of dark red blood.   Comments Medications reviewed with patient Daytime contact number verified with patient. [name] RN  February 17, 2010 3:40 PM    Physical Exam  Additional Exam:  Gen: Pleasant, well-nourished, in no distress,  normal affect ENT: purulent nares, postnasl mucus Neck: No JVD, no TMG, no carotid bruits Lungs: No use of accessory muscles, no dullness to percussion,  Cardiovascular: RRR, heart sounds normal, no murmur or gallops, no peripheral edema Abdomen: soft and NT, no HSM,  BS normal Musculoskeletal: No deformities, no cyanosis or clubbing Neuro: alert, non focal Skin: Warm, no lesions or rashes    Impression &  Recommendations:  Problem # 1:  OTHER ACUTE SINUSITIS (ICD-461.8) Assessment Deteriorated acute sinusitis plan depomedrol 120mg  IM augmentin x 7days nasal rinse nasal steroids pulse oral steroids His updated medication list for this problem includes:    Amoxicillin-pot Clavulanate 875-125 Mg Tabs (Amoxicillin-pot clavulanate) ..... One by mouth two times a day    Fluticasone Propionate 50 Mcg/act Susp (Fluticasone propionate) .Marland Kitchen..Marland Kitchen Two sprays each nostril daily  Orders: Est. Patient Level IV (56433) Depo- Medrol 80mg  (J1040) Depo- Medrol 40mg  (J1030) Admin of Therapeutic Inj  intramuscular or subcutaneous (29518)  Medications Added to Medication List This Visit: 1)  Allegra Allergy 180 Mg Tabs (Fexofenadine hcl) .... Take 1 tablet by mouth once a day 2)  Allegra Allergy 180 Mg Tabs (Fexofenadine hcl) .... Hold for 10days then resume one daily 3)  Amoxicillin-pot Clavulanate 875-125 Mg Tabs (Amoxicillin-pot clavulanate) .... One by mouth two times a day 4)  Fluticasone Propionate 50 Mcg/act Susp (Fluticasone propionate) .... Two sprays each nostril daily 5)  Prednisone 10 Mg Tabs (Prednisone) .... Take as directed take 4 daily for two days, then 3 daily for two days, then two daily for two days then one daily for  two days then stop  Complete Medication List: 1)  Nexium 40 Mg Cpdr (Esomeprazole magnesium) .... Once daily 2)  Allegra Allergy 180 Mg Tabs (Fexofenadine hcl) .... Hold for 10days then resume one daily 3)  Losartan Potassium 50 Mg Tabs (Losartan potassium) .... Once daily 4)  Hydrochlorothiazide 25 Mg Tabs (Hydrochlorothiazide) .... Once daily 5)  Advair Diskus 250-50 Mcg/dose Misc (Fluticasone-salmeterol) .... One puff twice daily 6)  Ibuprofen 200 Mg Tabs (Ibuprofen) .... As directed on bottle 7)  Proair Hfa 108 (90 Base) Mcg/act Aers (Albuterol sulfate) .... Take one to two puff every 4 hours as needed 8)  Cpap  .Marland KitchenMarland KitchenMarland Kitchen 14 cmh20 9)  Amoxicillin-pot Clavulanate 875-125 Mg  Tabs (Amoxicillin-pot clavulanate) .... One by mouth two times a day 10)  Fluticasone Propionate 50 Mcg/act Susp (Fluticasone propionate) .... Two sprays each nostril daily 11)  Prednisone 10 Mg Tabs (Prednisone) .... Take as directed take 4 daily for two days, then 3 daily for two days, then two daily for two days then one daily for two days then stop  Patient Instructions: 1)  A depomedrol injection 120mg  IM will be given 2)  Amoxicillin/clavulinic acid one twice daily for 7days 3)  Use NeilMed sinus rinse twice daily for 10days 4)  Use the NeilMed nasal rinse at least daily washing out both nares thoroughly.  Place one packet of Sinus Wash ingredients into the nasal wash bottle then fill to the dotted line with lukewarm tap water.  Lean over the sink and rinse each nostril out thoroughly and avoid letting the rinse go into the throat.   5)  Fluticasone two sprays each nostril daily 6)  Hold allegra for 10days then resume 7)  Prednisone 10mg  Take 4 daily for two days, then 3 daily for two days, then two daily for two days then one daily for two days then stop 8)  Return 2 months or sooner if unimproved Prescriptions: PREDNISONE 10 MG  TABS (PREDNISONE) Take as directed Take 4 daily for two days, then 3 daily for two days, then two daily for two days then one daily for two days then stop  #20 x 0   Entered and Authorized by:   Storm Frisk MD   Signed by:   Storm Frisk MD on 02/17/2010   Method used:   Electronically to        [facility]* (retail)       [address]       [address] Shipping/mailing       [place], [place]  [address]       Ph: [phone]       Fax: [phone]   RxID:   [id number] FLUTICASONE PROPIONATE 50 MCG/ACT SUSP (FLUTICASONE PROPIONATE) Two sprays each nostril daily  #1 x 6   Entered and Authorized by:   Storm Frisk MD   Signed by:   Storm Frisk MD on 02/17/2010   Method used:   Electronically to        [facility]* (retail)       [address]       [address] Shipping/mailing       [place], [place]  [address]       Ph: [phone]       Fax: [phone]   RxID:   [id number] AMOXICILLIN-POT CLAVULANATE 875-125 MG TABS (AMOXICILLIN-POT CLAVULANATE) one by mouth two times a day  #14 x 0  Entered and Authorized by:   Storm Frisk MD   Signed by:   Storm Frisk MD on 02/17/2010   Method used:   Electronically to        [facility]* (retail)       [address]       [address] Shipping[address]mailing       [place], [place]  [address]       Ph: [phone]       Fax: [phone]   RxID:   [id number]     Immunization History:  Influenza Immunization History:    Influenza:  historical (12/19/2009)    Medication Administration  Injection # 1:    Medication: Depo- Medrol 80mg     Diagnosis: OTHER ACUTE SINUSITIS (ICD-461.8)    Route: IM    Site: LUOQ gluteus    Exp Date: 03/19/2010    Lot #: [id number]    Mfr: Teva    Patient tolerated injection without complications    Given by: Renold Genta RCP, LPN (February 17, 2010 4:40 PM)  Injection # 2:    Medication: Depo- Medrol 40mg     Diagnosis: OTHER ACUTE SINUSITIS (ICD-461.8)    Route: IM    Site: LUOQ gluteus    Exp Date: 03/19/2010    Lot #: [id number]    Mfr: Teva    Given by: Renold Genta RCP, LPN (February 17, 2010 4:41 PM)  Orders Added: 1)  Est. Patient Level IV [42595] 2)  Depo- Medrol 80mg  [J1040] 3)  Depo- Medrol 40mg  [J1030] 4)  Admin of Therapeutic Inj  intramuscular or subcutaneous [63875]

## 2010-05-12 ENCOUNTER — Inpatient Hospital Stay (INDEPENDENT_AMBULATORY_CARE_PROVIDER_SITE_OTHER)
Admission: RE | Admit: 2010-05-12 | Discharge: 2010-05-12 | Disposition: A | Discharge status: Home / Self Care | Payer: 59 | Source: Ambulatory Visit | Attending: Family Medicine | Admitting: Family Medicine

## 2010-05-12 DIAGNOSIS — J069 Acute upper respiratory infection, unspecified: Secondary | ICD-10-CM

## 2010-06-10 LAB — COMPREHENSIVE METABOLIC PANEL
ALT: 22 U/L (ref 0–53)
AST: 20 U/L (ref 0–37)
Albumin: 3.7 g/dL (ref 3.5–5.2)
Alkaline Phosphatase: 89 U/L (ref 39–117)
BUN: 11 mg/dL (ref 6–23)
CO2: 29 mEq/L (ref 19–32)
Creatinine, Ser: 1.11 mg/dL (ref 0.4–1.5)
Glucose, Bld: 103 mg/dL — ABNORMAL HIGH (ref 70–99)

## 2010-06-10 LAB — BASIC METABOLIC PANEL
Calcium: 8.7 mg/dL (ref 8.4–10.5)
Calcium: 9 mg/dL (ref 8.4–10.5)
Chloride: 104 mEq/L (ref 96–112)
GFR calc Af Amer: >60 mL/min (ref >60)
GFR calc non Af Amer: >60 mL/min (ref >60)
Glucose, Bld: 118 mg/dL — ABNORMAL HIGH (ref 70–99)
Glucose, Bld: 130 mg/dL — ABNORMAL HIGH (ref 70–99)
Potassium: 3.3 mEq/L — ABNORMAL LOW (ref 3.5–5.1)
Potassium: 3.5 mEq/L (ref 3.5–5.1)
Sodium: 141 mEq/L (ref 135–145)
Sodium: 142 mEq/L (ref 135–145)

## 2010-06-10 LAB — CBC
HCT: 39.7 % (ref 39.0–52.0)
Hemoglobin: 11.2 g/dL — ABNORMAL LOW (ref 13.0–17.0)
Hemoglobin: 11.6 g/dL — ABNORMAL LOW (ref 13.0–17.0)
MCHC: 32.2 g/dL (ref 30.0–36.0)
MCHC: 33.3 g/dL (ref 30.0–36.0)
MCV: 86.5 fL (ref 78.0–100.0)
MCV: 86.7 fL (ref 78.0–100.0)
Platelets: 337 10*3/uL (ref 150–400)
Platelets: 349 10*3/uL (ref 150–400)
RBC: 4.03 MIL/uL — ABNORMAL LOW (ref 4.22–5.81)
RBC: 4.58 MIL/uL (ref 4.22–5.81)
RDW: 15 % (ref 11.5–15.5)
RDW: 15.3 % (ref 11.5–15.5)
WBC: 4.6 10*3/uL (ref 4.0–10.5)

## 2010-06-10 LAB — CARDIAC PANEL(CRET KIN+CKTOT+MB+TROPI)
CK, MB: 1.7 ng/mL (ref 0.3–4.0)
Relative Index: 0.7 (ref 0.0–2.5)
Total CK: 241 U/L — ABNORMAL HIGH (ref 7–232)

## 2010-06-10 LAB — URINALYSIS, ROUTINE W REFLEX MICROSCOPIC
Bilirubin Urine: NEGATIVE
Glucose, UA: NEGATIVE mg/dL
Protein, ur: NEGATIVE mg/dL
Specific Gravity, Urine: 1.022 (ref 1.005–1.030)

## 2010-06-10 LAB — MRSA PCR SCREENING: MRSA by PCR: NEGATIVE

## 2010-06-10 LAB — BRAIN NATRIURETIC PEPTIDE: Pro B Natriuretic peptide (BNP): <30 pg/mL (ref 0.0–100.0)

## 2010-06-10 LAB — SEDIMENTATION RATE: Sed Rate: 13 mm/hr (ref 0–16)

## 2010-06-10 LAB — DIFFERENTIAL
Basophils Relative: 0 % (ref 0–1)
Eosinophils Absolute: 0.1 10*3/uL (ref 0.0–0.7)
Lymphocytes Relative: 11 % — ABNORMAL LOW (ref 12–46)
Lymphs Abs: 1.3 10*3/uL (ref 0.7–4.0)
Monocytes Absolute: 0.5 10*3/uL (ref 0.1–1.0)
Monocytes Relative: 5 % (ref 3–12)
Neutrophils Relative %: 49 % (ref 43–77)
Neutrophils Relative %: 84 % — ABNORMAL HIGH (ref 43–77)

## 2010-06-10 LAB — APTT: aPTT: 30 seconds (ref 24–37)

## 2010-06-10 LAB — PROTIME-INR: INR: 0.95 (ref 0.00–1.49)

## 2010-06-21 LAB — POCT RAPID STREP A (OFFICE): Streptococcus, Group A Screen (Direct): NEGATIVE

## 2010-06-26 LAB — CK TOTAL AND CKMB (NOT AT ARMC)
CK, MB: 1.3 ng/mL (ref 0.3–4.0)
Relative Index: 1.1 (ref 0.0–2.5)
Total CK: 119 U/L (ref 7–232)

## 2010-06-26 LAB — DIFFERENTIAL
Basophils Absolute: 0.1 10*3/uL (ref 0.0–0.1)
Basophils Relative: 2 % — ABNORMAL HIGH (ref 0–1)
Eosinophils Absolute: 0.2 10*3/uL (ref 0.0–0.7)
Monocytes Relative: 11 % (ref 3–12)
Neutro Abs: 1.9 10*3/uL (ref 1.7–7.7)
Neutrophils Relative %: 39 % — ABNORMAL LOW (ref 43–77)

## 2010-06-26 LAB — BASIC METABOLIC PANEL
BUN: 14 mg/dL (ref 6–23)
Calcium: 9.5 mg/dL (ref 8.4–10.5)
Creatinine, Ser: 1.09 mg/dL (ref 0.4–1.5)
GFR calc Af Amer: >60 mL/min (ref >60)
GFR calc non Af Amer: >60 mL/min (ref >60)

## 2010-06-26 LAB — CBC
MCV: 90.2 fL (ref 78.0–100.0)
Platelets: 343 10*3/uL (ref 150–400)
RBC: 3.9 MIL/uL — ABNORMAL LOW (ref 4.22–5.81)
WBC: 4.9 10*3/uL (ref 4.0–10.5)

## 2010-07-21 ENCOUNTER — Encounter: Payer: Self-pay | Admitting: Critical Care Medicine

## 2010-07-23 ENCOUNTER — Ambulatory Visit: Payer: Self-pay | Admitting: Critical Care Medicine

## 2010-07-27 ENCOUNTER — Encounter: Payer: Self-pay | Admitting: Critical Care Medicine

## 2010-07-27 ENCOUNTER — Ambulatory Visit (INDEPENDENT_AMBULATORY_CARE_PROVIDER_SITE_OTHER): Payer: 59 | Admitting: Critical Care Medicine

## 2010-07-27 VITALS — BP 130/78 | HR 82 | Temp 98.2°F | Ht 67.0 in | Wt 190.8 lb

## 2010-07-27 DIAGNOSIS — J449 Chronic obstructive pulmonary disease, unspecified: Secondary | ICD-10-CM

## 2010-07-27 DIAGNOSIS — J4489 Other specified chronic obstructive pulmonary disease: Secondary | ICD-10-CM

## 2010-07-27 DIAGNOSIS — J209 Acute bronchitis, unspecified: Secondary | ICD-10-CM

## 2010-07-27 MED ORDER — PREDNISONE 10 MG PO TABS: ORAL_TABLET | ORAL | Status: DC

## 2010-07-27 MED ORDER — AMOXICILLIN-POT CLAVULANATE 875-125 MG PO TABS: 1.0000 | ORAL_TABLET | Freq: Two times a day (BID) | ORAL | Status: AC

## 2010-07-27 NOTE — Progress Notes (Signed)
Subjective:    Patient ID: Collin Lopez, male    DOB: 1965/09/06, 45 y.o.   MRN: [id number]  HPI  45 y.o. Notes more sneezing and coughing.  Mucus is with pndrip.  Notes some chest pain.  Notes some headaches  Past Medical History  Diagnosis Date  . Sarcoidosis   . Esophageal reflux   . Unspecified essential hypertension   . Tear of medial cartilage or meniscus of knee, current   . Congenital pes planus   . Abnormality of gait   . Pain in joint, lower leg   . OSA on CPAP      Family History  Problem Relation Age of Onset  . Heart disease Father   . Cancer Mother      History   Social History  . Marital Status: Single    Spouse Name: N/A    Number of Children: N/A  . Years of Education: N/A   Occupational History  . Environmental services    Social History Main Topics  . Smoking status: Former Smoker -- 0.2 packs/day for 3 years    Types: Cigarettes    Quit date: 03/21/1993  . Smokeless tobacco: Never Used   Comment: 1ppd x 2 years  . Alcohol Use: Not on file  . Drug Use: Not on file  . Sexually Active: Not on file   Other Topics Concern  . Not on file   Social History Narrative  . No narrative on file     No Known Allergies   Outpatient Prescriptions Prior to Visit  Medication Sig Dispense Refill  . albuterol (PROAIR HFA) 108 (90 BASE) MCG/ACT inhaler Inhale 2 puffs into the lungs every 6 (six) hours as needed.        Marland Kitchen azelastine (ASTELIN) 137 MCG/SPRAY nasal spray 2 sprays by Nasal route 2 (two) times daily. Use in each nostril as directed       . esomeprazole (NEXIUM) 40 MG capsule Take 40 mg by mouth daily before breakfast.        . fluticasone (VERAMYST) 27.5 MCG/SPRAY nasal spray 2 sprays by Nasal route daily.        . Fluticasone-Salmeterol (ADVAIR DISKUS) 250-50 MCG/DOSE AEPB Inhale 1 puff into the lungs every 12 (twelve) hours.        . hydrochlorothiazide 25 MG tablet Take 25 mg by mouth daily.        Marland Kitchen ibuprofen (ADVIL,MOTRIN) 200 MG  tablet Take 200 mg by mouth every 6 (six) hours as needed.        Marland Kitchen losartan (COZAAR) 50 MG tablet Take 50 mg by mouth daily.           Review of Systems Constitutional:   No  weight loss, night sweats,  Fevers, chills, fatigue, lassitude. HEENT:   No headaches,  Difficulty swallowing,  Tooth/dental problems,  Sore throat,                No sneezing, itching, ear ache, nasal congestion, post nasal drip,   CV:  No chest pain,  Orthopnea, PND, swelling in lower extremities, anasarca, dizziness, palpitations  GI  No heartburn, indigestion, abdominal pain, nausea, vomiting, diarrhea, change in bowel habits, loss of appetite  Resp: Notes  shortness of breath with exertion and  at rest.  No excess mucus, notes  productive cough,  No non-productive cough,  No coughing up of blood.  No change in color of mucus.  No wheezing.  No chest wall deformity  Skin: no  rash or lesions.  GU: no dysuria, change in color of urine, no urgency or frequency.  No flank pain.  MS:  No joint pain or swelling.  No decreased range of motion.  No back pain.  Psych:  No change in mood or affect. No depression or anxiety.  No memory loss.     Objective:   Physical Exam Filed Vitals:   07/27/10 0930  BP: 130/78  Pulse: 82  Temp: 98.2 F (36.8 C)  TempSrc: Oral  Height: 5\' 7"  (1.702 m)  Weight: 190 lb 12.8 oz (86.546 kg)  SpO2: 98%    Gen: Pleasant, well-nourished, in no distress,  normal affect  ENT: No lesions,  mouth clear,  oropharynx clear, no postnasal drip  Neck: No JVD, no TMG, no carotid bruits  Lungs: No use of accessory muscles, no dullness to percussion, exp wheeze   Cardiovascular: RRR, heart sounds normal, no murmur or gallops, no peripheral edema  Abdomen: soft and NT, no HSM,  BS normal  Musculoskeletal: No deformities, no cyanosis or clubbing  Neuro: alert, non focal  Skin: Warm, no lesions or rashes        Assessment & Plan:   OBSTRUCTIVE CHRONIC BRONCHITIS WITHOUT  EXACERBAT Asthmatic bronchitis on basis of allergic factors Pulse prednisone    Updated Medication List Outpatient Encounter Prescriptions as of 07/27/2010  Medication Sig Dispense Refill  . albuterol (PROAIR HFA) 108 (90 BASE) MCG/ACT inhaler Inhale 2 puffs into the lungs every 6 (six) hours as needed.        Marland Kitchen azelastine (ASTELIN) 137 MCG/SPRAY nasal spray 2 sprays by Nasal route 2 (two) times daily. Use in each nostril as directed       . esomeprazole (NEXIUM) 40 MG capsule Take 40 mg by mouth daily before breakfast.        . fexofenadine-pseudoephedrine (ALLEGRA-D 24) 180-240 MG per 24 hr tablet Take 1 tablet by mouth daily.        . fluticasone (VERAMYST) 27.5 MCG/SPRAY nasal spray 2 sprays by Nasal route daily.        . Fluticasone-Salmeterol (ADVAIR DISKUS) 250-50 MCG/DOSE AEPB Inhale 1 puff into the lungs every 12 (twelve) hours.        . hydrochlorothiazide 25 MG tablet Take 25 mg by mouth daily.        Marland Kitchen ibuprofen (ADVIL,MOTRIN) 200 MG tablet Take 200 mg by mouth every 6 (six) hours as needed.        Marland Kitchen losartan (COZAAR) 50 MG tablet Take 50 mg by mouth daily.        Marland Kitchen amoxicillin-clavulanate (AUGMENTIN) 875-125 MG per tablet Take 1 tablet by mouth 2 (two) times daily.  20 tablet  0  . predniSONE (DELTASONE) 10 MG tablet Take 4 for three days, 3 for three days, 2 for three days, 1 for three days and stop   30 tablet  0

## 2010-07-27 NOTE — Assessment & Plan Note (Addendum)
Asthmatic bronchitis on basis of allergic factors Pulse prednisone

## 2010-07-27 NOTE — Patient Instructions (Signed)
Pulse prednisone and antibiotic called to pharmacy No other medication changes Return 4 months, sooner if unimproved

## 2010-08-06 NOTE — Op Note (Signed)
[place]  Patient:    Collin Lopez, Collin Lopez                       MRN: [id number] Proc. Date: 11/04/99 Attending:  Charlcie Cradle. Delford Field, M.D. [facility]                           Operative Report  INDICATIONS:  Bilateral hilar adenopathy, evaluate for cause.  OPERATOR:  Charlcie Cradle. Delford Field, M.D. [facility]  ANESTHESIA:  Xylocaine 1% local.  PREOPERATIVE MEDICATIONS: 1. Versed 3 mg IV push. 2. Demerol 30 mg IV push.  PROCEDURE:  The Olympus video bronchoscope was introduced in the right naris. The upper airways were visualized, were unremarkable.  The entire tracheobronchial tree was visualized, revealed no endobronchial lesions consistent with malignancy, however, cobblestone inflammation was seen throughout both airways consistent with the diagnosis of sarcoidosis. Transbronchial biopsies were then obtained from the right lower lobe without difficulty along with bronchial washings.  COMPLICATIONS:  None.  IMPRESSION:  Bilateral hilar adenopathy, evaluate for cause.  RECOMMENDATIONS:  Follow up pathology. DD:  11/04/99 TD:  11/04/99 Job: [id number]

## 2010-08-25 ENCOUNTER — Ambulatory Visit (INDEPENDENT_AMBULATORY_CARE_PROVIDER_SITE_OTHER): Payer: 59 | Admitting: Family Medicine

## 2010-08-25 DIAGNOSIS — M79669 Pain in unspecified lower leg: Secondary | ICD-10-CM

## 2010-08-25 DIAGNOSIS — M25561 Pain in right knee: Secondary | ICD-10-CM

## 2010-08-25 DIAGNOSIS — M79609 Pain in unspecified limb: Secondary | ICD-10-CM

## 2010-08-25 DIAGNOSIS — M25569 Pain in unspecified knee: Secondary | ICD-10-CM

## 2010-08-25 MED ORDER — MELOXICAM 15 MG PO TABS: 15.0000 mg | ORAL_TABLET | Freq: Every day | ORAL | Status: DC

## 2010-08-25 NOTE — Progress Notes (Signed)
  Subjective:    Patient ID: Collin Lopez, male    DOB: 10/04/1965, 45 y.o.   MRN: [id number]  HPI 45 year old male here for right knee and lower leg pain. He is previously been seen in our clinic a couple years ago for left knee pain and eventually found to have a meniscal tear that was debrided by Dr. Bryson Ha.  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 He states since then he is having worsening right knee pain but more significantly lower leg and shin pain. He does recall one incident 2 months ago where he took her difficult step down and had a little twinge his knee, but he denies having a swollen knee or an obvious pop. Since then he has had mostly pain in the knee and lower leg, but really denies any true instability, locking, or catching. He really relates the worsening pain to the change in his job status with moving heavy amounts of garbage. Occasionally it'll keep him up at night.  Review of Systems Denies fever, sweats, chills, weight loss    Objective:   Physical Exam Gen. appearance: Well-appearing male in no distress The right hip: Full range of motion without pain Back negative seated and supine straight leg raise Right knee has some mild medial joint line tenderness to palpation, but no obvious palpable extruded meniscus. No effusion. He has full range of motion. His ligaments are all intact. Really has a negative McMurray's, even though he does have a small click but it is not painful. He is mildly tender diffusely over the knee. Negative patellar apprehension and grind test. Right lower leg: He has a mild diffuse tenderness all along his tibia and anterior tibialis muscle. No obvious swelling or erythema. He has no calf tenderness. He has no significant point tenderness. Neurovascularly he is intact distally Feet: He has severe pes planus and  overpronates whenever he walks. He also significant midfoot breakdown.  MSK ultrasound right knee: Normal patellar and quad tendons. No effusion noted. Medial knee view shows normal meniscus and MCL. Lateral view shows normal LCL and meniscus.  MSK [place] of his right tibia. Shows no obvious cortical irregularity or edema along the shaft either in the long or transverse views.       Assessment & Plan:  Right knee and leg pain without clear etiology. He does have significant pes planus and overpronation causing gait abnormality, and I think is likely predisposing him to worsening proximal right leg pain. I think his increased activity at his job is also a factor. I think his right lower leg pain is really what is most concerning to him, I think is almost getting a job related shin splint syndrome, but I have low suspicion for an actual stress fracture. I also don't think he has any type of compartment syndrome or artery or nerve entrapment. -X-rays of his knee and tib-fib to rule out arthritis or any other pathology that I missed on ultrasound -Trial of Mobic daily with food -I think custom orthotics will be of most benefit to him. Thus I will see him back in one to 2 weeks at which point we will go over his x-rays and make him some custom orthotics.

## 2010-08-26 ENCOUNTER — Ambulatory Visit (HOSPITAL_COMMUNITY)
Admission: RE | Admit: 2010-08-26 | Discharge: 2010-08-26 | Disposition: A | Discharge status: Home / Self Care | Payer: 59 | Source: Ambulatory Visit | Attending: Family Medicine | Admitting: Family Medicine

## 2010-08-26 DIAGNOSIS — M25561 Pain in right knee: Secondary | ICD-10-CM

## 2010-08-26 DIAGNOSIS — M7730 Calcaneal spur, unspecified foot: Secondary | ICD-10-CM | POA: Insufficient documentation

## 2010-08-26 DIAGNOSIS — M79669 Pain in unspecified lower leg: Secondary | ICD-10-CM

## 2010-08-26 DIAGNOSIS — M79609 Pain in unspecified limb: Secondary | ICD-10-CM | POA: Insufficient documentation

## 2010-08-27 ENCOUNTER — Ambulatory Visit: Payer: 59 | Admitting: Family Medicine

## 2010-09-03 ENCOUNTER — Ambulatory Visit (INDEPENDENT_AMBULATORY_CARE_PROVIDER_SITE_OTHER): Payer: 59 | Admitting: Family Medicine

## 2010-09-03 DIAGNOSIS — M79609 Pain in unspecified limb: Secondary | ICD-10-CM

## 2010-09-03 DIAGNOSIS — M25561 Pain in right knee: Secondary | ICD-10-CM

## 2010-09-03 DIAGNOSIS — M25569 Pain in unspecified knee: Secondary | ICD-10-CM

## 2010-09-03 DIAGNOSIS — R269 Unspecified abnormalities of gait and mobility: Secondary | ICD-10-CM

## 2010-09-03 DIAGNOSIS — M79669 Pain in unspecified lower leg: Secondary | ICD-10-CM

## 2010-09-03 DIAGNOSIS — Q6650 Congenital pes planus, unspecified foot: Secondary | ICD-10-CM

## 2010-09-03 NOTE — Progress Notes (Signed)
  Subjective:    Patient ID: Collin Lopez, male    DOB: 1965-09-18, 45 y.o.   MRN: [id number]  HPI 45 year old male followup on right knee and leg pain. He got x-rays of the both that were normal. 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 I still think orthotics would be very beneficial for him given his poor foot structure. And he would like to get those today.   Review of Systems Denies fever, sweats, chills, weight loss    Objective:   Physical Exam Gen. Appearance: Appropriate male no distress Right knee: Full range of motion without effusion. No medial or lateral joint line tenderness. Right tibia: Minimal tenderness along the middle to distal third of his tibia, since last exam. Bilateral feet tall and significant pes planus with transverse arch breakdown in mild to moderate bunion of his great toes. He also has over pronation and midfoot breakdown. Gait: Bilateral overpronation.       Assessment & Plan:  Right knee and tibia pain I think is related to his foot structure. X-rays and prior ultrasound were normal. - Job modification as he can obtain with his supervisor - Custom orthotics as below  Bilateral foot pain with significant gait abnormality pes planus, overpronation, in bunion -Custom orthotics -Followup as need  Patient was fitted for a : standard, cushioned, semi-rigid orthotic. The orthotic was heated and afterward the patient stood on the orthotic blank positioned on the orthotic stand. The patient was positioned in subtalar neutral position and 10 degrees of ankle dorsiflexion in a weight bearing stance. After completion of molding, a stable base was applied to the orthotic blank. The blank was ground to a stable position for weight bearing. Size: 10 blue swirl Base: EVA Posting: 1st ray post on Lt, double layer 1st ray post on right Additional orthotic padding:none  Tried these prior to  leaving and these helped correct overpronation.  Spent greater than 30 minutes with the patient, over 50% spent on counseling on diagnosis, prognosis, and treatment as well as making of custom orthotics.

## 2011-01-30 ENCOUNTER — Emergency Department (HOSPITAL_COMMUNITY)
Admission: EM | Admit: 2011-01-30 | Discharge: 2011-01-30 | Disposition: A | Discharge status: Nursing Home (Includes ICF) | Payer: 59 | Source: Home / Self Care | Attending: Emergency Medicine | Admitting: Emergency Medicine

## 2011-01-30 ENCOUNTER — Encounter (HOSPITAL_COMMUNITY): Payer: Self-pay | Admitting: *Deleted

## 2011-01-30 ENCOUNTER — Emergency Department (HOSPITAL_COMMUNITY): Payer: 59

## 2011-01-30 ENCOUNTER — Emergency Department (HOSPITAL_COMMUNITY)
Admission: EM | Admit: 2011-01-30 | Discharge: 2011-01-30 | Disposition: A | Discharge status: Home / Self Care | Payer: 59 | Attending: Emergency Medicine | Admitting: Emergency Medicine

## 2011-01-30 DIAGNOSIS — R109 Unspecified abdominal pain: Secondary | ICD-10-CM | POA: Insufficient documentation

## 2011-01-30 DIAGNOSIS — Z79899 Other long term (current) drug therapy: Secondary | ICD-10-CM | POA: Insufficient documentation

## 2011-01-30 DIAGNOSIS — M545 Low back pain, unspecified: Secondary | ICD-10-CM | POA: Insufficient documentation

## 2011-01-30 DIAGNOSIS — D869 Sarcoidosis, unspecified: Secondary | ICD-10-CM | POA: Insufficient documentation

## 2011-01-30 DIAGNOSIS — I10 Essential (primary) hypertension: Secondary | ICD-10-CM | POA: Insufficient documentation

## 2011-01-30 DIAGNOSIS — G4733 Obstructive sleep apnea (adult) (pediatric): Secondary | ICD-10-CM | POA: Insufficient documentation

## 2011-01-30 DIAGNOSIS — K219 Gastro-esophageal reflux disease without esophagitis: Secondary | ICD-10-CM | POA: Insufficient documentation

## 2011-01-30 LAB — COMPREHENSIVE METABOLIC PANEL
AST: 17 U/L (ref 0–37)
Albumin: 3.6 g/dL (ref 3.5–5.2)
BUN: 14 mg/dL (ref 6–23)
Creatinine, Ser: 1.09 mg/dL (ref 0.50–1.35)
Potassium: 3.2 mEq/L — ABNORMAL LOW (ref 3.5–5.1)
Total Protein: 6.9 g/dL (ref 6.0–8.3)

## 2011-01-30 LAB — POCT URINALYSIS DIP (DEVICE)
Glucose, UA: NEGATIVE mg/dL
Hgb urine dipstick: NEGATIVE
Leukocytes, UA: NEGATIVE
Nitrite: NEGATIVE
Urobilinogen, UA: 1 mg/dL (ref 0.0–1.0)
pH: 6 (ref 5.0–8.0)

## 2011-01-30 MED ORDER — CYCLOBENZAPRINE HCL 10 MG PO TABS: 10.0000 mg | ORAL_TABLET | Freq: Two times a day (BID) | ORAL | Status: AC | PRN

## 2011-01-30 MED ORDER — HYDROCODONE-ACETAMINOPHEN 5-500 MG PO TABS: 1.0000 | ORAL_TABLET | Freq: Four times a day (QID) | ORAL | Status: AC | PRN

## 2011-01-30 MED ORDER — POTASSIUM CHLORIDE CRYS ER 20 MEQ PO TBCR
40.0000 meq | EXTENDED_RELEASE_TABLET | Freq: Once | ORAL | Status: AC
  Administered 2011-01-30: 40 meq via ORAL
  Filled 2011-01-30: qty 2

## 2011-01-30 NOTE — ED Notes (Addendum)
Seen at [facility] earlier,  Urine obtained there, sent here, C/o back pain, onset ~ 2 weeks ago, R lower back pain, also a little pain in R groin, sharp, fluctuates, some relief with ibuprofen, (denies: nvd, fever, loss of control of bowel or bladder), mentions some possible radiation down leg, "thinks it might be a pulled muscle", also mentions has had urinary frequency and urgency, denies incontinance.no meds PTA.

## 2011-01-30 NOTE — ED Provider Notes (Addendum)
History     CSN: [id number] Arrival date & time: 01/30/2011  6:08 PM   First MD Initiated Contact with Patient 01/30/11 1658      Chief Complaint  Patient presents with  . Flank Pain    HPI Comments: Been having pain for about 2 weeks, it been hurting bad since i started hurting again , the pain has been shooting down to my groin., also with urinary sx's" liek at times feel like i need to rush to urinate" "almost urinate on me". No fevers No diarrheas  Patient is a 45 y.o. male presenting with flank pain.  Flank Pain The current episode started more than 1 week ago. The problem occurs constantly. Associated symptoms include abdominal pain. Pertinent negatives include no chest pain, no headaches and no shortness of breath. The symptoms are aggravated by bending and twisting. The symptoms are relieved by nothing. He has tried a cold compress for the symptoms. The treatment provided no relief.    Past Medical History  Diagnosis Date  . Sarcoidosis   . Esophageal reflux   . Unspecified essential hypertension   . Tear of medial cartilage or meniscus of knee, current   . Congenital pes planus   . Abnormality of gait   . Pain in joint, lower leg   . OSA on CPAP     Past Surgical History  Procedure Date  . Knee surgery     Family History  Problem Relation Age of Onset  . Heart disease Father   . Cancer Mother     History  Substance Use Topics  . Smoking status: Former Smoker -- 0.2 packs/day for 3 years    Types: Cigarettes    Quit date: 03/21/1993  . Smokeless tobacco: Never Used   Comment: 1ppd x 2 years  . Alcohol Use: No      Review of Systems  Respiratory: Negative for shortness of breath.   Cardiovascular: Negative for chest pain.  Gastrointestinal: Positive for abdominal pain.  Genitourinary: Positive for flank pain.  Neurological: Negative for headaches.    Allergies  Review of patient's allergies indicates no known allergies.  Home Medications    Current Outpatient Rx  Name Route Sig Dispense Refill  . ALBUTEROL SULFATE HFA 108 (90 BASE) MCG/ACT IN AERS Inhalation Inhale 2 puffs into the lungs every 6 (six) hours as needed.      . AZELASTINE HCL 137 MCG/SPRAY NA SOLN Nasal 2 sprays by Nasal route 2 (two) times daily. Use in each nostril as directed     . ESOMEPRAZOLE MAGNESIUM 40 MG PO CPDR Oral Take 40 mg by mouth daily before breakfast.      . FLUTICASONE FUROATE 27.5 MCG/SPRAY NA SUSP Nasal 2 sprays by Nasal route daily.      Marland Kitchen FLUTICASONE-SALMETEROL 250-50 MCG/DOSE IN AEPB Inhalation Inhale 1 puff into the lungs every 12 (twelve) hours.      Marland Kitchen HYDROCHLOROTHIAZIDE 25 MG PO TABS Oral Take 25 mg by mouth daily.      . IBUPROFEN 200 MG PO TABS Oral Take 200 mg by mouth every 6 (six) hours as needed.      Marland Kitchen LOSARTAN POTASSIUM 50 MG PO TABS Oral Take 50 mg by mouth daily.        BP 154/98  Pulse 91  Temp(Src) 98.2 F (36.8 C) (Oral)  Resp 18  SpO2 100%  Physical Exam  ED Course  Procedures (including critical care time)  Labs Reviewed  POCT URINALYSIS DIP (DEVICE) -  Abnormal; Notable for the following:    Ketones, ur TRACE (*)    All other components within normal limits  POCT URINALYSIS DIPSTICK   No results found.   1. Abdominal  pain, other specified site   2. Flank pain       MDM  2 weeks- with flank and RUQ pian with urinary symptoms        Jimmie Molly, MD 01/30/11 Windell Moment  Jimmie Molly, MD 01/30/11 [phone]

## 2011-01-30 NOTE — ED Notes (Signed)
Started with right flank pain approx 2 wks ago; felt he had been constipated and may have been aggravated by active job.  Had 1 wk off work, with pain easing some, but as soon as pt became active it became worse again.  Pain now sharp, stabbing, radiating from right groin around to right posterior flank.  Has also had frequent urination.  Has been drinking cranberry juice, applying ice, and having massage.  Pain progressively worsening.  Has been taking IBU without relief.

## 2011-01-30 NOTE — ED Notes (Signed)
PT DENIES ANY HX of Kidney stones.

## 2011-01-30 NOTE — ED Provider Notes (Signed)
History     CSN: [id number] Arrival date & time: 01/30/2011  7:22 PM   None     Chief Complaint  Patient presents with  . Back Pain    (Consider location/radiation/quality/duration/timing/severity/associated sxs/prior treatment) Patient is a 45 y.o. male presenting with back pain and flank pain. The history is provided by the patient and the spouse.  Back Pain  Episode onset: 2 wks ago while at work. The problem occurs constantly. The problem has been gradually improving. The pain is associated with lifting heavy objects. Pain location: right CVA area and right flank. The quality of the pain is described as aching and cramping. Radiates to: into groin on several occasions during the last two weeks. The pain is moderate. The symptoms are aggravated by bending and twisting (working). Worse during: working makes pain worse. Associated symptoms include abdominal pain. Pertinent negatives include no chest pain, no fever and no headaches. He has tried NSAIDs for the symptoms. The treatment provided moderate relief.  Flank Pain This is a new problem. The current episode started 1 to 4 weeks ago (2 wks ago). Associated symptoms include abdominal pain. Pertinent negatives include no chest pain, coughing, fever, headaches, nausea or vomiting. The symptoms are aggravated by bending. He has tried NSAIDs for the symptoms. The treatment provided moderate relief.    Past Medical History  Diagnosis Date  . Sarcoidosis   . Esophageal reflux   . Unspecified essential hypertension   . Tear of medial cartilage or meniscus of knee, current   . Congenital pes planus   . Abnormality of gait   . Pain in joint, lower leg   . OSA on CPAP     Past Surgical History  Procedure Date  . Knee surgery     Family History  Problem Relation Age of Onset  . Heart disease Father   . Cancer Mother     History  Substance Use Topics  . Smoking status: Former Smoker -- 0.2 packs/day for 3 years    Types:  Cigarettes    Quit date: 03/21/1993  . Smokeless tobacco: Never Used   Comment: 1ppd x 2 years  . Alcohol Use: No      Review of Systems  Constitutional: Negative for fever.  Respiratory: Negative for cough and shortness of breath.   Cardiovascular: Negative for chest pain.  Gastrointestinal: Positive for abdominal pain. Negative for nausea, vomiting and diarrhea.  Genitourinary: Positive for flank pain.  Musculoskeletal: Positive for back pain.  Neurological: Negative for headaches.  All other systems reviewed and are negative.    Allergies  Review of patient's allergies indicates no known allergies.  Home Medications   Current Outpatient Rx  Name Route Sig Dispense Refill  . ALBUTEROL SULFATE HFA 108 (90 BASE) MCG/ACT IN AERS Inhalation Inhale 2 puffs into the lungs every 6 (six) hours as needed. For shortness of breath    . AZELASTINE HCL 137 MCG/SPRAY NA SOLN Nasal Place 2 sprays into the nose 2 (two) times daily as needed. Use in each nostril as directed  For nasal drainage    . ESOMEPRAZOLE MAGNESIUM 40 MG PO CPDR Oral Take 40 mg by mouth daily before breakfast.      . FLUTICASONE-SALMETEROL 250-50 MCG/DOSE IN AEPB Inhalation Inhale 1 puff into the lungs every 12 (twelve) hours.      Marland Kitchen HYDROCHLOROTHIAZIDE 25 MG PO TABS Oral Take 25 mg by mouth daily.      . IBUPROFEN 200 MG PO TABS Oral Take 200  mg by mouth every 6 (six) hours as needed. For pain    . LOSARTAN POTASSIUM 50 MG PO TABS Oral Take 50 mg by mouth daily.      Marland Kitchen FLUTICASONE FUROATE 27.5 MCG/SPRAY NA SUSP Nasal Place 2 sprays into the nose daily as needed. For nasal drainage      BP 152/88  Pulse 89  Temp(Src) 98 F (36.7 C) (Oral)  Resp 16  SpO2 100%  Physical Exam  Nursing note and vitals reviewed. Constitutional: He is oriented to person, place, and time. He appears well-developed and well-nourished. No distress.  HENT:  Head: Normocephalic and atraumatic.  Eyes: Pupils are equal, round, and  reactive to light.  Cardiovascular: Normal rate, regular rhythm and normal heart sounds.   Pulmonary/Chest: Effort normal and breath sounds normal. No respiratory distress.  Abdominal: Soft. He exhibits no distension. There is no tenderness.  Musculoskeletal: Normal range of motion. He exhibits no tenderness.       Arms: Neurological: He is alert and oriented to person, place, and time. No cranial nerve deficit. He exhibits normal muscle tone. Coordination normal.  Skin: Skin is warm and dry.     Psychiatric: He has a normal mood and affect.    ED Course  Procedures (including critical care time)  Labs Reviewed  COMPREHENSIVE METABOLIC PANEL - Abnormal; Notable for the following:    Potassium 3.2 (*)    Glucose, Bld 127 (*)    Total Bilirubin 0.1 (*)    GFR calc non Af Amer 79 (*)    All other components within normal limits   Ct Abdomen Pelvis Wo Contrast  01/30/2011  *RADIOLOGY REPORT*  Clinical Data: 45 year old male with right abdominal and flank pain times 2 weeks.  CT ABDOMEN AND PELVIS WITHOUT CONTRAST  Technique:  Multidetector CT imaging of the abdomen and pelvis was performed following the standard protocol without intravenous contrast.  Comparison: Upper GI series 11/15/2005.  Findings: Lung bases are clear.  No pericardial or pleural effusion.  Maximal superior diaphragmatic, retrocrural, and gastrohepatic ligament lymph nodes are nonspecific measuring up to 9 mm in short axis.  No other upper abdominal lymphadenopathy.  No acute osseous abnormality identified.  No lumbar spinal stenosis.  Fat containing inguinal hernias.  No pelvic free fluid.  Negative bladder.  Negative distal colon aside from intermittent diverticula in the sigmoid and descending segment.  Normal appendix and distal small bowel.  No dilated small bowel loops.  Negative noncontrast stomach, duodenum, liver, gallbladder, spleen, pancreas and adrenal glands.  No right nephrolithiasis, hydronephrosis or  perinephric stranding. Subtle right mid pole low density area with densitometry suggesting a simple cyst.  No right hydroureter.  Negative distal right ureter.  No left hydronephrosis, nephrolithiasis, hydroureter, or perinephric stranding.  Subtle low density partially exophytic lesion at the mid pole with densitometry suggesting a simple cyst.  No abdominal free fluid.  Retroperitoneal lymph nodes are within normal limits.  Mesenteric and pelvic nodes are within normal limits.  IMPRESSION: 1.  No urologic calculus or evidence of acute urinary tract finding. 2.  Normal appendix.  No focal inflammatory changes in the abdomen or pelvis. 3.  Nonspecific lymph nodes about the diaphragm, favor benign etiology.  Probable benign renal cysts.  Incidental fat containing inguinal hernias.  Original Report Authenticated By: Ulla Potash III, M.D.     1. Flank pain       MDM  Patient with two weeks of right CVA/flank pain. Pt was seen at urgent care  earlier tonight. U/a was negative. They were concerned about another process and sent him here. Will get CT without contrast to eval for possible kidney process. Will also check Cr level and liver function as patient also mildly tender there.  11:48 PM Pt with no acute findings to describe right flank/cva pain. Likely muscle strain. Will advise symptomatic care, no heavy lifting, and more time off work to allow pain to go completely away. Will advise patient to see his doctor in about a week if he is still having pain. Will also treat for hypokalemia.       Daleen Bo 01/30/11 2354

## 2011-01-30 NOTE — ED Notes (Signed)
Report called to Keisha, ED Triage RN. 

## 2011-01-31 NOTE — ED Provider Notes (Signed)
I saw and evaluated the patient, reviewed the resident's note and I agree with the findings and plan.  Pain in flank and abdomen, seems musculoskeletal in nature.  He is tender in the right flank.  Labs, CT are okay.  Will discharge with pain meds and give time.  No apparent emergent condition.  Geoffery Lyons, MD 01/31/11 [phone]

## 2011-08-30 ENCOUNTER — Encounter: Payer: Self-pay | Admitting: Internal Medicine

## 2011-08-30 ENCOUNTER — Ambulatory Visit (INDEPENDENT_AMBULATORY_CARE_PROVIDER_SITE_OTHER): Payer: 59 | Admitting: Internal Medicine

## 2011-08-30 VITALS — BP 158/87 | HR 88 | Temp 98.6°F | Ht 67.0 in | Wt 190.0 lb

## 2011-08-30 DIAGNOSIS — J3489 Other specified disorders of nose and nasal sinuses: Secondary | ICD-10-CM

## 2011-08-30 DIAGNOSIS — Z13220 Encounter for screening for lipoid disorders: Secondary | ICD-10-CM

## 2011-08-30 DIAGNOSIS — D649 Anemia, unspecified: Secondary | ICD-10-CM

## 2011-08-30 DIAGNOSIS — I10 Essential (primary) hypertension: Secondary | ICD-10-CM

## 2011-08-30 DIAGNOSIS — E876 Hypokalemia: Secondary | ICD-10-CM

## 2011-08-30 LAB — CBC
MCH: 25.9 pg — ABNORMAL LOW (ref 26.0–34.0)
Platelets: 404 10*3/uL — ABNORMAL HIGH (ref 150–400)
RBC: 4.91 MIL/uL (ref 4.22–5.81)
RDW: 16.4 % — ABNORMAL HIGH (ref 11.5–15.5)
WBC: 5.3 10*3/uL (ref 4.0–10.5)

## 2011-08-30 LAB — LIPID PANEL
Cholesterol: 212 mg/dL — ABNORMAL HIGH (ref 0–200)
HDL: 33 mg/dL — ABNORMAL LOW (ref >39)
Total CHOL/HDL Ratio: 6.4 Ratio
VLDL: 38 mg/dL (ref 0–40)

## 2011-08-30 LAB — BASIC METABOLIC PANEL WITH GFR
BUN: 20 mg/dL (ref 6–23)
CO2: 26 mEq/L (ref 19–32)
Chloride: 105 mEq/L (ref 96–112)
Creat: 1.13 mg/dL (ref 0.50–1.35)
Potassium: 4.2 mEq/L (ref 3.5–5.3)

## 2011-08-30 MED ORDER — ALBUTEROL SULFATE HFA 108 (90 BASE) MCG/ACT IN AERS: 2.0000 | INHALATION_SPRAY | Freq: Four times a day (QID) | RESPIRATORY_TRACT | Status: DC | PRN

## 2011-08-30 MED ORDER — FLUTICASONE FUROATE 27.5 MCG/SPRAY NA SUSP: 2.0000 | Freq: Every day | NASAL | Status: DC | PRN

## 2011-08-30 MED ORDER — LORATADINE 10 MG PO TABS: 10.0000 mg | ORAL_TABLET | Freq: Every day | ORAL | Status: DC

## 2011-08-30 NOTE — Assessment & Plan Note (Addendum)
Patient is taking flonase nasal spray which helps a lot. I will refill this prescription. Steam inhalation 3 times a day. claritin for decongestion. 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 Patient was asked to follow up as needed.

## 2011-08-30 NOTE — Assessment & Plan Note (Signed)
I will check potassium and creatinine today. Patient has been using over-the-counter decongestants which can raise blood pressure acutely.  I will not change any blood pressure medications at this time.

## 2011-08-30 NOTE — Assessment & Plan Note (Signed)
Patient is 29 and has never had a lipid profile done. Based on the current guidelines I will check a lipid profile.

## 2011-08-30 NOTE — Patient Instructions (Signed)
Allergic Rhinitis  Allergic rhinitis is when the mucous membranes in the nose respond to allergens. Allergens are particles in the air that cause your body to have an allergic reaction. This causes you to release allergic antibodies. Through a chain of events, these eventually cause you to release histamine into the blood stream (hence the use of antihistamines). Although meant to be protective to the body, it is this release that causes your discomfort, such as frequent sneezing, congestion and an itchy runny nose.    CAUSES    The pollen allergens may come from grasses, trees, and weeds. This is seasonal allergic rhinitis, or "hay fever." Other allergens cause year-round allergic rhinitis (perennial allergic rhinitis) such as house dust mite allergen, pet dander and mold spores.    SYMPTOMS     Nasal stuffiness (congestion).   Runny, itchy nose with sneezing and tearing of the eyes.   There is often an itching of the mouth, eyes and ears.  It cannot be cured, but it can be controlled with medications.  DIAGNOSIS    If you are unable to determine the offending allergen, skin or blood testing may find it.  TREATMENT     Avoid the allergen.   Medications and allergy shots (immunotherapy) can help.   Hay fever may often be treated with antihistamines in pill or nasal spray forms. Antihistamines block the effects of histamine. There are over-the-counter medicines that may help with nasal congestion and swelling around the eyes. Check with your caregiver before taking or giving this medicine.  If the treatment above does not work, there are many new medications your caregiver can prescribe. Stronger medications may be used if initial measures are ineffective. Desensitizing injections can be used if medications and avoidance fails. Desensitization is when a patient is given ongoing shots until the body becomes less sensitive to the allergen. Make sure you follow up with your caregiver if problems continue.  SEEK  MEDICAL CARE IF:     You develop fever (more than 100.5 F (38.1 C).   You develop a cough that does not stop easily (persistent).   You have shortness of breath.   You start wheezing.   Symptoms interfere with normal daily activities.  Document Released: 11/30/2000 Document Revised: 02/24/2011 Document Reviewed: 06/11/2008  ExitCare Patient Information 2012 [employer].

## 2011-08-30 NOTE — Progress Notes (Signed)
  Subjective:    Patient ID: Collin Lopez, male    DOB: [birth year], 46 y.o.   MRN: [id number]  HPI  Patient is here today to establish care.  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  He is taking hctz and losartan for HTN.  He has sarcoidosis and is under care of Dr. Delford Field. He takes inhaled advair for sarcoidosis. No shortness of breath. Cough is at his basline.  He has stuffy nose and was prescribed azithromycin and flonase nasal spray. Patient still feels that he has congested nose and throat. He does not have fever, chills or shortness of breath.  No other complaints.  Patient has not had any preventative care medicine as he hasn't had a primary care physician.  Review of Systems  Constitutional: Negative for fever, activity change and appetite change.  HENT: Positive for congestion. Negative for sore throat, sneezing and postnasal drip.   Respiratory: Negative for cough and shortness of breath.   Cardiovascular: Negative for chest pain and leg swelling.  Gastrointestinal: Negative for nausea, abdominal pain, diarrhea, constipation and abdominal distention.  Genitourinary: Negative for frequency, hematuria and difficulty urinating.  Neurological: Negative for dizziness and headaches.  Psychiatric/Behavioral: Negative for suicidal ideas and behavioral problems.       Objective:   Physical Exam  Constitutional: He is oriented to person, place, and time. He appears well-developed and well-nourished.  HENT:  Head: Normocephalic and atraumatic.  Nose: Mucosal edema, rhinorrhea, nasal deformity and septal deviation present. No nose lacerations. Right sinus exhibits no maxillary sinus tenderness and no frontal sinus tenderness. Left sinus exhibits no maxillary sinus tenderness and no frontal sinus tenderness.  Eyes: Conjunctivae and EOM are normal. Pupils are equal, round, and reactive to light. No scleral icterus.  Neck: Normal range of motion. Neck supple. No  JVD present. No thyromegaly present.  Cardiovascular: Normal rate, regular rhythm, normal heart sounds and intact distal pulses.  Exam reveals no gallop and no friction rub.   No murmur heard. Pulmonary/Chest: Effort normal and breath sounds normal. No respiratory distress. He has no wheezes. He has no rales.  Abdominal: Soft. Bowel sounds are normal. He exhibits no distension and no mass. There is no tenderness. There is no rebound and no guarding.  Musculoskeletal: Normal range of motion. He exhibits no edema and no tenderness.  Lymphadenopathy:    He has no cervical adenopathy.  Neurological: He is alert and oriented to person, place, and time.  Psychiatric: He has a normal mood and affect. His behavior is normal.          Assessment & Plan:

## 2011-10-24 ENCOUNTER — Ambulatory Visit (INDEPENDENT_AMBULATORY_CARE_PROVIDER_SITE_OTHER): Payer: 59 | Admitting: Internal Medicine

## 2011-10-24 ENCOUNTER — Encounter: Payer: Self-pay | Admitting: Internal Medicine

## 2011-10-24 VITALS — BP 126/87 | HR 80 | Temp 98.2°F | Ht 67.5 in | Wt 195.5 lb

## 2011-10-24 DIAGNOSIS — M25512 Pain in left shoulder: Secondary | ICD-10-CM

## 2011-10-24 DIAGNOSIS — M25519 Pain in unspecified shoulder: Secondary | ICD-10-CM

## 2011-10-24 MED ORDER — IBUPROFEN 600 MG PO TABS: 600.0000 mg | ORAL_TABLET | Freq: Four times a day (QID) | ORAL | Status: AC | PRN

## 2011-10-24 NOTE — Patient Instructions (Addendum)
1. follow up with me in one month 2. Will send referral for sports medicine.  3. Ibuprofen 600  Mg po every 6 hours as needed.

## 2011-10-24 NOTE — Assessment & Plan Note (Signed)
The clinical manifestation is consistent with acute left shoulder pain , llikely related to repeated lifting in the setting of remote shoulder injury unknown origin. Denies precipitating  injury or trauma. Tenderness noted on exam with limited ROM at all directions.  - will give NSAIDs Ibuprofen 600 mg po every 6 hours as needed. - sports medicine referral

## 2011-10-24 NOTE — Progress Notes (Signed)
Subjective:   Patient ID: Collin Lopez male   DOB: [birth year] 46 y.o.   MRN: [id number]  HPI: [name] is a 46 y.o. man with PMH of HTN, OSA on CPAP and Sarcoidosis, who presents to the clinic for 2 weeks of left shoulder pain.  Patient states that he started to have gradual onset left shoulder dull pain after lifting some boxes during his work 2 weeks. 5-10/10. No radiation. Pain is worse with ROM, activity and lifting. Resting partially relieves pain.(  He did not have any shoulder discomfort until after he went ). He admits remote history of left shoulder injury but does not remember what happened. He used some ice packs and took Ibuprofen 200 mg po x1 dose without much relief. He is here for further evaluation.      Past Medical History  Diagnosis Date  . Sarcoidosis   . Esophageal reflux   . Unspecified essential hypertension   . Tear of medial cartilage or meniscus of knee, current   . Congenital pes planus   . Abnormality of gait   . Pain in joint, lower leg   . OSA on CPAP    Current Outpatient Prescriptions  Medication Sig Dispense Refill  . albuterol (PROAIR HFA) 108 (90 BASE) MCG/ACT inhaler Inhale 2 puffs into the lungs every 6 (six) hours as needed for shortness of breath. For shortness of breath  1 Inhaler  prn  . azelastine (ASTELIN) 137 MCG/SPRAY nasal spray Place 2 sprays into the nose 2 (two) times daily as needed. Use in each nostril as directed  For nasal drainage      . esomeprazole (NEXIUM) 40 MG capsule Take 40 mg by mouth daily before breakfast.        . fluticasone (VERAMYST) 27.5 MCG/SPRAY nasal spray Place 2 sprays into the nose daily as needed. For nasal drainage  10 g  0  . Fluticasone-Salmeterol (ADVAIR DISKUS) 250-50 MCG/DOSE AEPB Inhale 1 puff into the lungs every 12 (twelve) hours.        . hydrochlorothiazide 25 MG tablet Take 25 mg by mouth daily.        Marland Kitchen ibuprofen (ADVIL,MOTRIN) 200 MG tablet Take 200 mg by mouth every 6 (six) hours as  needed. For pain      . loratadine (CLARITIN) 10 MG tablet Take 1 tablet (10 mg total) by mouth daily.  30 tablet  0  . losartan (COZAAR) 50 MG tablet Take 50 mg by mouth daily.         Family History  Problem Relation Age of Onset  . Heart disease Father     Died 3 years ago due to CHF and refused pacemaker.  . Cancer Mother     breast cancer passed away in August 30, 2009.   History   Social History  . Marital Status: Married    Spouse Name: Collin Lopez    Number of Children: N/A  . Years of Education: 10   Occupational History  . Environmental services   .  [employer]   Social History Main Topics  . Smoking status: Former Smoker -- 0.2 packs/day for 3 years    Types: Cigarettes    Quit date: 03/21/1993  . Smokeless tobacco: Never Used   Comment: 1ppd x 2 years  . Alcohol Use: No  . Drug Use: No  . Sexually Active: Yes -- Male partner(s)   Other Topics Concern  . 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   Review of Systems: Review of Systems:  Constitutional:  Denies fever, chills, diaphoresis, appetite change and fatigue.   HEENT:  Denies congestion, sore throat, rhinorrhea, sneezing, mouth sores, trouble swallowing, neck pain   Respiratory:  Denies SOB, DOE, cough, and wheezing.   Cardiovascular:  Denies palpitations and leg swelling.   Gastrointestinal:  Denies nausea, vomiting, abdominal pain, diarrhea, constipation, blood in stool and abdominal distention.   Genitourinary:  Denies dysuria, urgency, frequency, hematuria, flank pain and difficulty urinating.   Musculoskeletal:  Denies myalgias, back pain, joint swelling, arthralgias and gait problem.left shoulder pain.   Skin:  Denies pallor, rash and wound.   Neurological:  Denies dizziness, seizures, syncope, weakness, light-headedness, numbness and headaches.    .   Objective:  Physical Exam: Filed Vitals:   10/24/11 1616  BP: 126/87  Pulse: 80  Temp: 98.2 F (36.8  C)  TempSrc: Oral  Height: 5' 7.5" (1.715 m)  Weight: 195 lb 8 oz (88.678 kg)   General: alert, well-developed, and cooperative to examination.  Head: normocephalic and atraumatic.  Eyes: vision grossly intact, pupils equal, pupils round, pupils reactive to light, no injection and anicteric.  Mouth: pharynx pink and moist, no erythema, and no exudates.  Neck: supple, full ROM, no thyromegaly, no JVD, and no carotid bruits.  Lungs: normal respiratory effort, no accessory muscle use, normal breath sounds, no crackles, and no wheezes. Heart: normal rate, regular rhythm, no murmur, no gallop, and no rub.  Abdomen: soft, non-tender, normal bowel sounds, no distention, no guarding, no rebound tenderness, no hepatomegaly, and no splenomegaly.  Msk: no joint swelling, no joint warmth, and no redness over joints. Left shoulder generalized tenderness. Limited ROM. No swelling, erythema, warmth to touch.  Pulses: 2+ DP/PT pulses bilaterally Extremities: No cyanosis, clubbing, edema Neurologic: alert & oriented X3, cranial nerves II-XII intact, strength normal in all extremities, sensation intact to light touch, and gait normal.  Skin: turgor normal and no rashes.  Psych: Oriented X3, memory intact for recent and remote, normally interactive, good eye contact, not anxious appearing, and not depressed appearing.   Assessment & Plan:

## 2011-10-28 ENCOUNTER — Ambulatory Visit (INDEPENDENT_AMBULATORY_CARE_PROVIDER_SITE_OTHER): Payer: 59 | Admitting: Sports Medicine

## 2011-10-28 VITALS — BP 120/80 | Ht 67.0 in | Wt 192.0 lb

## 2011-10-28 DIAGNOSIS — S43429A Sprain of unspecified rotator cuff capsule, initial encounter: Secondary | ICD-10-CM

## 2011-10-28 DIAGNOSIS — S46019A Strain of muscle(s) and tendon(s) of the rotator cuff of unspecified shoulder, initial encounter: Secondary | ICD-10-CM

## 2011-10-28 DIAGNOSIS — M25519 Pain in unspecified shoulder: Secondary | ICD-10-CM

## 2011-10-28 MED ORDER — NAPROXEN 500 MG PO TABS: 500.0000 mg | ORAL_TABLET | Freq: Two times a day (BID) | ORAL | Status: DC

## 2011-10-28 NOTE — Progress Notes (Signed)
  Subjective:    Patient ID: Collin Lopez, male    DOB: 05-23-1965, 46 y.o.   MRN: [id number]  HPI chief complaint: Left shoulder pain  Right-hand-dominant 46 year old male comes in today complaining of 2 weeks of left shoulder pain. 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 He describes an ache along the lateral aspect of his shoulder. Some radiating pain into the trapezius as well as occasional spasm. No associated numbness or tingling. No prior injuries to the shoulder in the past, no prior shoulder surgeries. No nighttime pain. Symptoms improve at rest. He's been taking 600 mg of ibuprofen 3 times daily and this has been somewhat helpful. He is currently out of work and is seen today at the request of Dr. Dierdre Searles.  Medications are reviewed. Medical history significant for history of pulmonary sarcoidosis Socially he is a former smoker, denies alcohol use, and works for [employer] health    Review of Systems     Objective:   Physical Exam Well-developed, well-nourished. No acute distress. Awake alert and oriented x3 Left shoulder shows full range of motion with a positive painful arc. Mildly positive empty can, mildly positive Hawkins. No tenderness over the bicipital groove. Rotator cuff strength is 5/5 bilaterally and slightly reproducible of pain on the left with resisted supraspinatus. Negative speed, negative Yeargonsons. No tenderness over the a.c.joint. He is neurovascular intact distally.  Right shoulder shows full range of motion. No tenderness to palpation. Skin is intact. Good stability. Good strength. Neurovascular intact distally.  [facility] ultrasound: Left shoulder-images were obtained in both the transverse and longitudinal planes. Biceps tendon was well visualized in the bicipital groove. He does have some fluid surrounding the tendon but  tendon appears to be intact. Subscapularis, supraspinatus, and infraspinatus were all well-visualized. No discrete tear was seen. Subacromial bursa is slightly hypoechoic suggesting bursitis. Mild amount of fluid in the insertion of the infraspinatus tendon. A.c. joint showed some spurring with some edema.       Assessment & Plan:  1. Left shoulder pain secondary to rotator cuff strain/subacromial bursitis  I do not appreciate any discrete rotator cuff tear on today's ultrasound. Patient's left subacromial space was injected with a combination of 3 cc of Marcaine and 1 cc of Depo-Medrol. This was done under sterile technique after risks and benefits were explained. Injection was performed using a posterior approach. Patient tolerated the procedure without any difficulty. Open him on Naprosyn 500 mg twice a day with food for 7 days then when necessary. He is shown a comprehensive Jobe home exercise program. We will keep him out of work for one additional week after which I think he can try to return without restrictions. I will see him back in the office in 4 weeks. If symptoms persist despite today's treatment, we may need to consider further diagnostic. Call with questions or concerns in the interim.

## 2011-11-07 ENCOUNTER — Ambulatory Visit: Payer: 59 | Admitting: Family Medicine

## 2011-11-11 ENCOUNTER — Telehealth: Payer: Self-pay | Admitting: *Deleted

## 2011-11-11 ENCOUNTER — Other Ambulatory Visit: Payer: Self-pay | Admitting: *Deleted

## 2011-11-11 MED ORDER — IBUPROFEN 200 MG PO TABS: 200.0000 mg | ORAL_TABLET | Freq: Four times a day (QID) | ORAL | Status: DC | PRN

## 2011-11-11 NOTE — Telephone Encounter (Signed)
Needs refill for Ibuprofen 600mg  #30 filled 10/24/11 - pharmacy can not fill 200mg  - OTC.

## 2011-11-14 NOTE — Telephone Encounter (Signed)
Patient is already on Naproxen 500mg  BID which was started by sports medicine. He should not be on another NSAID ie ibuprofen as it will significantly increase the risk of GI ulceration and renal problems.

## 2011-11-14 NOTE — Telephone Encounter (Signed)
[facility] would not take since OTC. Sent another flag to Dr Eben Burow for Ibuprofen 600mg  on 11/11/11.

## 2011-11-15 ENCOUNTER — Other Ambulatory Visit: Payer: Self-pay | Admitting: Internal Medicine

## 2011-11-24 ENCOUNTER — Ambulatory Visit: Payer: 59 | Admitting: Sports Medicine

## 2011-12-07 ENCOUNTER — Ambulatory Visit (INDEPENDENT_AMBULATORY_CARE_PROVIDER_SITE_OTHER): Payer: 59 | Admitting: Sports Medicine

## 2011-12-07 ENCOUNTER — Telehealth: Payer: Self-pay | Admitting: Sports Medicine

## 2011-12-07 VITALS — BP 130/70 | Ht 67.0 in | Wt 192.0 lb

## 2011-12-07 DIAGNOSIS — M25519 Pain in unspecified shoulder: Secondary | ICD-10-CM

## 2011-12-07 DIAGNOSIS — M25512 Pain in left shoulder: Secondary | ICD-10-CM

## 2011-12-07 MED ORDER — MELOXICAM 15 MG PO TABS: ORAL_TABLET | ORAL | Status: DC

## 2011-12-07 NOTE — Progress Notes (Signed)
  Subjective:    Patient ID: Collin Lopez, male    DOB: June 26, 1965, 46 y.o.   MRN: [id number]  HPI chief complaint: Followup on left shoulder pain  Patient comes in today for followup on left shoulder pain. Overall pain is improved after subacromial cortisone injection. 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 The repetitive overhead activity has caused his shoulder to become sore again although it is not as painful as previously. Pain is along lateral aspect of his shoulder. It does occasionally awaken him at night. He is not currently on an anti-inflammatory medicine. He continues to work full duty. [facility] ultrasound at his last visit showed small subacromial bursitis but no discrete rotator cuff tear.    Review of Systems     Objective:   Physical Exam Well-developed, well-nourished. No acute distress. Awake alert and oriented x3  Left shoulder: Full range of motion with a positive painful ARC. No tenderness over the a.c. joint or the bicipital groove. Mildly positive empty can, positive Hawkins. Rotator cuff strength is 5/5 but slightly reproducible pain with resisted supraspinatus. Negative O'Brien's, negative clunk. Neurovascularly intact distally.      Assessment & Plan:  1. Left shoulder pain secondary to subacromial bursitis/rotator cuff strain  Patient would like to continue working if possible. I've asked his employer to limit his overhead reaching and lifting but I think he can continue to work full-time. We will start Mobic 15 mg daily for 2 weeks then when necessary. I would also like for the patient to start physical therapy. He'll followup with me in 4 weeks. If symptoms persist we may consider merits of further diagnostic imaging in the form of an MRI arthrogram. If his shoulder pain worsens in the interim we may need to consider a brief period out of work. Patient will call with questions or concerns prior to his  followup visit.

## 2011-12-07 NOTE — Telephone Encounter (Signed)
I spoke with the patient's employer on the phone. She had questions regarding his limitations. I explained that the only limitation is with repetitive overhead reaching and lifting. All other lifting below shoulder level is fine.

## 2011-12-27 ENCOUNTER — Ambulatory Visit: Payer: 59 | Attending: Sports Medicine | Admitting: Rehabilitative and Restorative Service Providers"

## 2011-12-27 DIAGNOSIS — IMO0001 Reserved for inherently not codable concepts without codable children: Secondary | ICD-10-CM | POA: Insufficient documentation

## 2011-12-27 DIAGNOSIS — M25519 Pain in unspecified shoulder: Secondary | ICD-10-CM | POA: Insufficient documentation

## 2011-12-27 DIAGNOSIS — M2569 Stiffness of other specified joint, not elsewhere classified: Secondary | ICD-10-CM | POA: Insufficient documentation

## 2011-12-28 ENCOUNTER — Ambulatory Visit: Payer: PRIVATE HEALTH INSURANCE | Attending: Sports Medicine | Admitting: Physical Therapy

## 2011-12-28 ENCOUNTER — Encounter: Payer: 59 | Admitting: Rehabilitation

## 2011-12-28 DIAGNOSIS — IMO0001 Reserved for inherently not codable concepts without codable children: Secondary | ICD-10-CM | POA: Insufficient documentation

## 2011-12-28 DIAGNOSIS — M25519 Pain in unspecified shoulder: Secondary | ICD-10-CM | POA: Insufficient documentation

## 2011-12-28 DIAGNOSIS — M2569 Stiffness of other specified joint, not elsewhere classified: Secondary | ICD-10-CM | POA: Insufficient documentation

## 2012-01-02 ENCOUNTER — Ambulatory Visit (INDEPENDENT_AMBULATORY_CARE_PROVIDER_SITE_OTHER): Payer: 59 | Admitting: Sports Medicine

## 2012-01-02 ENCOUNTER — Ambulatory Visit: Payer: PRIVATE HEALTH INSURANCE | Admitting: Rehabilitation

## 2012-01-02 VITALS — BP 148/90 | Ht 67.0 in | Wt 192.0 lb

## 2012-01-02 DIAGNOSIS — M25519 Pain in unspecified shoulder: Secondary | ICD-10-CM

## 2012-01-02 NOTE — Progress Notes (Signed)
  Subjective:    Patient ID: Collin Lopez, male    DOB: Jun 24, 1965, 46 y.o.   MRN: [id number]  HPI Maijor comes in today for followup on his left shoulder. Pain is much improved. Only a 1/10. Still taking intermittent meloxicam and continues to work although is with the restrictions of no overhead reaching. He feels like he is ready to return to work without restrictions. He denies any strength loss. He has been working in physical therapy but I do not have those notes for review. Sounds like he's been a couple of times and has a good understanding of his home exercise program.    Review of Systems     Objective:   Physical Exam Well-developed, well-nourished. No acute distress.  Left shoulder: Full range of motion. No tenderness to palpation. He has a negative painful arc on today's exam. Rotator cuff strength is 5/5. Negative empty can, negative Hawkins. Negative O'Briens. Neurovascular intact distally.      Assessment & Plan:  1. Improved left shoulder pain secondary to subacromial bursitis  I think the patient is improved enough that he can return to work without restrictions. I do want him to continue with physical therapy until the therapist feels like he is ready to wean to a home exercise program. He understands the importance of continuing with his home exercises. I would like for him to try to wean from the meloxicam. Given his improvement I will discharge him from my care to followup when necessary.

## 2012-01-04 ENCOUNTER — Encounter: Payer: 59 | Admitting: Rehabilitation

## 2012-01-09 ENCOUNTER — Ambulatory Visit: Payer: PRIVATE HEALTH INSURANCE | Admitting: Rehabilitation

## 2012-01-09 ENCOUNTER — Encounter: Payer: 59 | Admitting: Rehabilitative and Restorative Service Providers"

## 2012-01-11 ENCOUNTER — Encounter: Payer: 59 | Admitting: Rehabilitative and Restorative Service Providers"

## 2012-01-16 ENCOUNTER — Ambulatory Visit: Payer: PRIVATE HEALTH INSURANCE | Admitting: Physical Therapy

## 2012-02-13 ENCOUNTER — Ambulatory Visit (INDEPENDENT_AMBULATORY_CARE_PROVIDER_SITE_OTHER): Payer: 59 | Admitting: Internal Medicine

## 2012-02-13 ENCOUNTER — Encounter: Payer: Self-pay | Admitting: Internal Medicine

## 2012-02-13 VITALS — BP 139/89 | HR 84 | Temp 96.8°F | Wt 204.4 lb

## 2012-02-13 DIAGNOSIS — I10 Essential (primary) hypertension: Secondary | ICD-10-CM

## 2012-02-13 DIAGNOSIS — Z13220 Encounter for screening for lipoid disorders: Secondary | ICD-10-CM

## 2012-02-13 DIAGNOSIS — D869 Sarcoidosis, unspecified: Secondary | ICD-10-CM

## 2012-02-13 DIAGNOSIS — G4730 Sleep apnea, unspecified: Secondary | ICD-10-CM

## 2012-02-13 MED ORDER — PRAVASTATIN SODIUM 20 MG PO TABS: 20.0000 mg | ORAL_TABLET | Freq: Every evening | ORAL | Status: DC

## 2012-02-13 MED ORDER — ALBUTEROL SULFATE HFA 108 (90 BASE) MCG/ACT IN AERS: 2.0000 | INHALATION_SPRAY | Freq: Four times a day (QID) | RESPIRATORY_TRACT | Status: DC | PRN

## 2012-02-13 MED ORDER — FLUTICASONE-SALMETEROL 250-50 MCG/DOSE IN AEPB: INHALATION_SPRAY | RESPIRATORY_TRACT | Status: DC

## 2012-02-13 NOTE — Assessment & Plan Note (Signed)
SOB most likely 2/2 poor compliance. Advised to be complaint with meds. Refills given for advair and albuterol. Lot #  [id number] Exp. Date 07/2012  Patient has been instructed regarding the correct time, dose, and frequency of taking this medication, including its desired effects and most common side effects.

## 2012-02-13 NOTE — Assessment & Plan Note (Signed)
Advised to use CPAP as prescribed. Patient is non compliant at this time.

## 2012-02-13 NOTE — Patient Instructions (Signed)
Please take your Advair regularly along with all other medications. Follow up in 1 month or earlier if needed. You have been started on a new medication called pravastatin everyday.

## 2012-02-13 NOTE — Assessment & Plan Note (Signed)
At goal.  

## 2012-02-13 NOTE — Progress Notes (Signed)
  Subjective:    Patient ID: Collin Lopez, male    DOB: 06/21/1965, 46 y.o.   MRN: [id number]  HPI Patient is having some difficulty walking since last 2 weeks. He says that he gets short of breath with minimal exertion. He has a history of pulmonary sarcoidosis and has not been compliant with his advair. Last PFT's in 2011 reviewed which showed mild restriction with no obstruction. He is only using advair as needed as opposed to twice a day as prescribed. He denies any chest pian, palpitations, swelling, orthopnea and PND. He is non smoker and otherwise trying to lose weight and eat healthy.   LDL was 141 from 6/11 and I will start him on statin today.  Flu shot done 2 weeks ago.     Review of Systems  Constitutional: Negative for fever, activity change and appetite change.  HENT: Negative for sore throat.   Respiratory: Positive for shortness of breath. Negative for cough.   Cardiovascular: Negative for chest pain and leg swelling.  Gastrointestinal: Negative for nausea, abdominal pain, diarrhea, constipation and abdominal distention.  Genitourinary: Negative for frequency, hematuria and difficulty urinating.  Neurological: Negative for dizziness and headaches.  Psychiatric/Behavioral: Negative for suicidal ideas and behavioral problems.       Objective:   Physical Exam  Constitutional: He is oriented to person, place, and time. He appears well-developed and well-nourished.  HENT:  Head: Normocephalic and atraumatic.  Eyes: Conjunctivae normal and EOM are normal. Pupils are equal, round, and reactive to light. No scleral icterus.  Neck: Normal range of motion. Neck supple. No JVD present. No thyromegaly present.  Cardiovascular: Normal rate, regular rhythm, normal heart sounds and intact distal pulses.  Exam reveals no gallop and no friction rub.   No murmur heard. Pulmonary/Chest: Effort normal and breath sounds normal. No respiratory distress. He has no wheezes. He has no  rales.  Abdominal: Soft. Bowel sounds are normal. He exhibits no distension and no mass. There is no tenderness. There is no rebound and no guarding.  Musculoskeletal: Normal range of motion. He exhibits no edema and no tenderness.  Lymphadenopathy:    He has no cervical adenopathy.  Neurological: He is alert and oriented to person, place, and time.  Psychiatric: He has a normal mood and affect. His behavior is normal.          Assessment & Plan:

## 2012-02-13 NOTE — Assessment & Plan Note (Signed)
LDL 141. Will start on pravastatin as risk score is >7.5% based on current guidelines.

## 2012-05-02 ENCOUNTER — Ambulatory Visit (INDEPENDENT_AMBULATORY_CARE_PROVIDER_SITE_OTHER): Payer: 59 | Admitting: Critical Care Medicine

## 2012-05-02 ENCOUNTER — Encounter: Payer: Self-pay | Admitting: Critical Care Medicine

## 2012-05-02 VITALS — BP 122/76 | HR 96 | Temp 97.6°F | Ht 67.0 in | Wt 203.0 lb

## 2012-05-02 DIAGNOSIS — J0100 Acute maxillary sinusitis, unspecified: Secondary | ICD-10-CM | POA: Insufficient documentation

## 2012-05-02 MED ORDER — PREDNISONE 10 MG PO TABS: ORAL_TABLET | ORAL | Status: DC

## 2012-05-02 MED ORDER — FLUTICASONE PROPIONATE 50 MCG/ACT NA SUSP: 2.0000 | Freq: Every day | NASAL | Status: DC

## 2012-05-02 MED ORDER — AMOXICILLIN-POT CLAVULANATE 875-125 MG PO TABS: 1.0000 | ORAL_TABLET | Freq: Two times a day (BID) | ORAL | Status: DC

## 2012-05-02 NOTE — Progress Notes (Signed)
Subjective:    Patient ID: Collin Lopez, male    DOB: 11-30-1965, 47 y.o.   MRN: [id number]  HPI   47 y.o.  05/02/2012 Last seen 2010/08/29.  Pt notes more dyspnea and cough. Pt felt change in weather was a trigger. Chest is heavy. Sinus are stopped up. Notes some pndrip.  Mucus is green . Notes some green out of nose. Notes more dyspnea.  Notes some headache.  There is increased sinus drainage and sinus pressure noted. There is no fever chills or sweats noted  Past Medical History  Diagnosis Date  . Sarcoidosis   . Esophageal reflux   . Unspecified essential hypertension   . Tear of medial cartilage or meniscus of knee, current   . Congenital pes planus   . Abnormality of gait   . Pain in joint, lower leg   . OSA on CPAP      Family History  Problem Relation Age of Onset  . Heart disease Father     Died 3 years ago due to CHF and refused pacemaker.  . Cancer Mother     breast cancer passed away in 08/28/2009.     History   Social History  . Marital Status: Married    Spouse Name: Khameron Gruenwald    Number of Children: N/A  . Years of Education: 10   Occupational History  . Environmental services   .  [employer]   Social History Main Topics  . Smoking status: Former Smoker -- 0.20 packs/day for 3 years    Types: Cigarettes    Quit date: 03/21/1993  . Smokeless tobacco: Never Used     Comment: 1ppd x 2 years  . Alcohol Use: No  . Drug Use: No  . Sexually Active: Yes -- Male partner(s)   Other Topics Concern  . 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           No Known Allergies   Outpatient Prescriptions Prior to Visit  Medication Sig Dispense Refill  . albuterol (PROAIR HFA) 108 (90 BASE) MCG/ACT inhaler Inhale 2 puffs into the lungs every 6 (six) hours as needed for shortness of breath. For shortness of breath  1 Inhaler  prn  . Fluticasone-Salmeterol (ADVAIR DISKUS) 250-50 MCG/DOSE AEPB Inhale 1 puff into  the lungs every 12 hours.  60 each  5  . hydrochlorothiazide 25 MG tablet Take 25 mg by mouth daily.        Marland Kitchen losartan (COZAAR) 50 MG tablet Take 50 mg by mouth daily.        . pravastatin (PRAVACHOL) 20 MG tablet Take 1 tablet (20 mg total) by mouth every evening.  30 tablet  11  . esomeprazole (NEXIUM) 40 MG capsule Take 40 mg by mouth daily before breakfast.        . naproxen (NAPROSYN) 500 MG tablet Take 1 tablet (500 mg total) by mouth 2 (two) times daily with a meal.  60 tablet  2   No facility-administered medications prior to visit.     Review of Systems  Constitutional:   No  weight loss, night sweats,  Fevers, chills, fatigue, lassitude. HEENT:   Notes  headaches,  Difficulty swallowing,  Tooth/dental problems,++  Sore throat,                No sneezing, itching, ear ache, +++nasal congestion,+++ post nasal drip,   CV:  No chest pain,  Orthopnea, PND, swelling in lower extremities, anasarca, dizziness, palpitations  GI  No heartburn, indigestion, abdominal pain, nausea, vomiting, diarrhea, change in bowel habits, loss of appetite  Resp: Notes  shortness of breath with exertion and  at rest.  No excess mucus, notes  productive cough,  No non-productive cough,  No coughing up of blood.  No change in color of mucus.  No wheezing.  No chest wall deformity  Skin: no rash or lesions.  GU: no dysuria, change in color of urine, no urgency or frequency.  No flank pain.  MS:  No joint pain or swelling.  No decreased range of motion.  No back pain.  Psych:  No change in mood or affect. No depression or anxiety.  No memory loss.     Objective:   Physical Exam  Filed Vitals:   05/02/12 1053  BP: 122/76  Pulse: 96  Temp: 97.6 F (36.4 C)  TempSrc: Oral  Height: 5\' 7"  (1.702 m)  Weight: 203 lb (92.08 kg)  SpO2: 99%    Gen: Pleasant, well-nourished, in no distress,  normal affect  ENT: No lesions,  mouth clear,  oropharynx clear, +++postnasal drip, bilateral nasal  purulence  Neck: No JVD, no TMG, no carotid bruits  Lungs: No use of accessory muscles, no dullness to percussion, exp wheeze   Cardiovascular: RRR, heart sounds normal, no murmur or gallops, no peripheral edema  Abdomen: soft and NT, no HSM,  BS normal  Musculoskeletal: No deformities, no cyanosis or clubbing  Neuro: alert, non focal  Skin: Warm, no lesions or rashes        Assessment & Plan:   No problem-specific assessment & plan notes found for this encounter.   Updated Medication List Outpatient Encounter Prescriptions as of 05/02/2012  Medication Sig Dispense Refill  . albuterol (PROAIR HFA) 108 (90 BASE) MCG/ACT inhaler Inhale 2 puffs into the lungs every 6 (six) hours as needed for shortness of breath. For shortness of breath  1 Inhaler  prn  . Fluticasone-Salmeterol (ADVAIR DISKUS) 250-50 MCG/DOSE AEPB Inhale 1 puff into the lungs every 12 hours.  60 each  5  . hydrochlorothiazide 25 MG tablet Take 25 mg by mouth daily.        Marland Kitchen losartan (COZAAR) 50 MG tablet Take 50 mg by mouth daily.        . pantoprazole (PROTONIX) 40 MG tablet Take 40 mg by mouth daily.      . pravastatin (PRAVACHOL) 20 MG tablet Take 1 tablet (20 mg total) by mouth every evening.  30 tablet  11  . [DISCONTINUED] esomeprazole (NEXIUM) 40 MG capsule Take 40 mg by mouth daily before breakfast.        . [DISCONTINUED] naproxen (NAPROSYN) 500 MG tablet Take 1 tablet (500 mg total) by mouth 2 (two) times daily with a meal.  60 tablet  2   No facility-administered encounter medications on file as of 05/02/2012.

## 2012-05-02 NOTE — Assessment & Plan Note (Addendum)
Acute maxillary sinusitis with mild bronchitic flare Plan Depo-Medrol injection 120 mg IM was given  Prednisone pulse and taper 7 days of Augmentin was prescribed Nasal steroid was given Continue Advair Return as needed

## 2012-05-02 NOTE — Patient Instructions (Signed)
A depomedrol 120mg  IM was given Take prednisone 10mg  Take 4 for two days three for two days two for two days one for two days Take augmentin 875mg  Twice daily for 7days Use flonase two puff daily each nostril Stay on advair  Return as needed

## 2012-07-09 ENCOUNTER — Other Ambulatory Visit: Payer: Self-pay | Admitting: *Deleted

## 2012-07-09 ENCOUNTER — Encounter: Payer: Self-pay | Admitting: *Deleted

## 2012-07-09 NOTE — Telephone Encounter (Signed)
Flag sent to front desk pool for appt per Dr Eben Burow.

## 2012-07-27 ENCOUNTER — Encounter: Payer: Self-pay | Admitting: Internal Medicine

## 2012-07-27 ENCOUNTER — Ambulatory Visit (INDEPENDENT_AMBULATORY_CARE_PROVIDER_SITE_OTHER): Payer: 59 | Admitting: Internal Medicine

## 2012-07-27 VITALS — BP 139/93 | HR 76 | Temp 97.1°F | Ht 67.5 in | Wt 202.6 lb

## 2012-07-27 DIAGNOSIS — M549 Dorsalgia, unspecified: Secondary | ICD-10-CM

## 2012-07-27 DIAGNOSIS — Z13220 Encounter for screening for lipoid disorders: Secondary | ICD-10-CM

## 2012-07-27 DIAGNOSIS — I10 Essential (primary) hypertension: Secondary | ICD-10-CM

## 2012-07-27 DIAGNOSIS — J309 Allergic rhinitis, unspecified: Secondary | ICD-10-CM

## 2012-07-27 DIAGNOSIS — D869 Sarcoidosis, unspecified: Secondary | ICD-10-CM

## 2012-07-27 DIAGNOSIS — J302 Other seasonal allergic rhinitis: Secondary | ICD-10-CM

## 2012-07-27 LAB — TSH: TSH: 2.007 u[IU]/mL (ref 0.350–4.500)

## 2012-07-27 LAB — LIPID PANEL
Cholesterol: 212 mg/dL — ABNORMAL HIGH (ref 0–200)
HDL: 33 mg/dL — ABNORMAL LOW
LDL Cholesterol: 149 mg/dL — ABNORMAL HIGH (ref 0–99)
Total CHOL/HDL Ratio: 6.4 ratio
Triglycerides: 151 mg/dL — ABNORMAL HIGH
VLDL: 30 mg/dL (ref 0–40)

## 2012-07-27 LAB — POCT GLYCOSYLATED HEMOGLOBIN (HGB A1C): Hemoglobin A1C: 6.3

## 2012-07-27 LAB — CBC
HCT: 39.7 % (ref 39.0–52.0)
Hemoglobin: 12.8 g/dL — ABNORMAL LOW (ref 13.0–17.0)
MCH: 26.2 pg (ref 26.0–34.0)
MCHC: 32.2 g/dL (ref 30.0–36.0)
MCV: 81.4 fL (ref 78.0–100.0)
Platelets: 391 K/uL (ref 150–400)
RBC: 4.88 MIL/uL (ref 4.22–5.81)
RDW: 15.9 % — ABNORMAL HIGH (ref 11.5–15.5)
WBC: 5.2 K/uL (ref 4.0–10.5)

## 2012-07-27 LAB — GLUCOSE, CAPILLARY: Glucose-Capillary: 97 mg/dL (ref 70–99)

## 2012-07-27 MED ORDER — ALBUTEROL SULFATE HFA 108 (90 BASE) MCG/ACT IN AERS: 2.0000 | INHALATION_SPRAY | Freq: Four times a day (QID) | RESPIRATORY_TRACT | Status: DC | PRN

## 2012-07-27 MED ORDER — CETIRIZINE HCL 10 MG PO TABS: 10.0000 mg | ORAL_TABLET | Freq: Every day | ORAL | Status: DC

## 2012-07-27 MED ORDER — ASPIRIN 81 MG PO TABS: 81.0000 mg | ORAL_TABLET | Freq: Every day | ORAL | Status: AC

## 2012-07-27 MED ORDER — PANTOPRAZOLE SODIUM 40 MG PO TBEC: 40.0000 mg | DELAYED_RELEASE_TABLET | Freq: Every day | ORAL | Status: DC

## 2012-07-27 MED ORDER — MOMETASONE FUROATE 50 MCG/ACT NA SUSP: 2.0000 | Freq: Every day | NASAL | Status: DC

## 2012-07-27 NOTE — Assessment & Plan Note (Signed)
Patient was started on statin therapy which he has not been taking regular basis.patient noted that he changed his diet and is currently doing some more exercise.in the last 10 months patient has gained 10 pounds. I emphasized the importance he needs to continue with diet and exercise on a regular basis. I will also obtain hemoglobin A1c today

## 2012-07-27 NOTE — Assessment & Plan Note (Signed)
Will try Nasonex and Zyrtec for his season allergies.

## 2012-07-27 NOTE — Assessment & Plan Note (Signed)
Patient's blood pressure is borderline today. Patient reports he did not take his medication today. I emphasized about the importance of taking medications on a daily basis patient has no headache, dizziness, chest pain. I'll continue current regimen including Cozaar 50 mg daily and hydrochlorothiazide 25 mg daily. I will obtain basic metabolic panel today.

## 2012-07-27 NOTE — Assessment & Plan Note (Signed)
No warning signs notable. At this point I would try to treat it conservatively. If no improvement noted consider imaging studies since patient has been on chronic steroids in the past for pulmonology sarcoidosis

## 2012-07-27 NOTE — Progress Notes (Signed)
Subjective:   Patient ID: Collin Lopez male   DOB: July 09, 1965 47 y.o.   MRN: [id number]  HPI: [name] is a 47 y.o. male with past medical history significant as outlined below who presented to the clinic for medication refill but then was complaining about: 1. Seasonal allergies: he has been using loratadine and Flonase on a daily basis without any significant improvement. He noted itchy eyes as well as and runny nose and congestion on almost daily basis.the patient has some shortness of breath but denies any cough or fevers. He uses albuterol inhaler maximum once a week  2. Back pain: which has been present for a couple of month. The pain is an achy in nature in the lower spine area. He has been using patches from over-the-counter without any significant relief. She is a Advertising copywriter at [employer]. He denies any weakness, numbness, tingling, fevers or chills, rashes, urinary or bladder incontinence.    Past Medical History  Diagnosis Date  . Sarcoidosis   . Esophageal reflux   . Unspecified essential hypertension   . Tear of medial cartilage or meniscus of knee, current   . Congenital pes planus   . Abnormality of gait   . Pain in joint, lower leg   . Collin on CPAP    Current Outpatient Prescriptions  Medication Sig Dispense Refill  . albuterol (PROAIR HFA) 108 (90 BASE) MCG/ACT inhaler Inhale 2 puffs into the lungs every 6 (six) hours as needed for shortness of breath. For shortness of breath  1 Inhaler  prn  . amoxicillin-clavulanate (AUGMENTIN) 875-125 MG per tablet Take 1 tablet by mouth 2 (two) times daily.  14 tablet  0  . fluticasone (FLONASE) 50 MCG/ACT nasal spray Place 2 sprays into the nose daily.  16 g  2  . Fluticasone-Salmeterol (ADVAIR DISKUS) 250-50 MCG/DOSE AEPB Inhale 1 puff into the lungs every 12 hours.  60 each  5  . hydrochlorothiazide 25 MG tablet Take 25 mg by mouth daily.        Marland Kitchen losartan (COZAAR) 50 MG tablet Take 50 mg by mouth daily.         . pantoprazole (PROTONIX) 40 MG tablet Take 40 mg by mouth daily.      . pravastatin (PRAVACHOL) 20 MG tablet Take 1 tablet (20 mg total) by mouth every evening.  30 tablet  11  . predniSONE (DELTASONE) 10 MG tablet Take 4 for two days three for two days two for two days one for two days  20 tablet  0   No current facility-administered medications for this visit.   Family History  Problem Relation Age of Onset  . Heart disease Father     Died 3 years ago due to CHF and refused pacemaker.  . Cancer Mother     breast cancer passed away in Aug 07, 2009.   History   Social History  . Marital Status: Married    Spouse Name: Collin Lopez    Number of Children: N/A  . Years of Education: 10   Occupational History  . Environmental services   .  [employer]   Social History Main Topics  . Smoking status: Former Smoker -- 0.20 packs/day for 3 years    Types: Cigarettes    Quit date: 03/21/1993  . Smokeless tobacco: Never Used     Comment: 1ppd x 2 years  . Alcohol Use: No  . Drug Use: No  . Sexually Active: Yes -- Male partner(s)  Other Topics Concern  . 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         Review of Systems: Constitutional: Denies fever, chills, diaphoresis, appetite change and fatigue.  HEENT: Noted redness in eyes,  congestion, sore throat, rhinorrhea, but denies mouth sores, trouble swallowing, neck pain,  Respiratory: Noted occasional  SOB, DOE, cough, chest tightness,  and wheezing.   Cardiovascular: Denies chest pain, palpitations and leg swelling.  Gastrointestinal: Denies nausea, vomiting, abdominal pain, diarrhea, constipation, blood in stool and abdominal distention.  Genitourinary: Denies dysuria, urgency, frequency, hematuria, flank pain and difficulty urinating.   Skin: Denies pallor, rash and wound.  Neurological: Denies dizziness,  weakness,numbness and headaches.    Objective:  Physical Exam: Filed  Vitals:   07/27/12 0957  BP: 138/93  Pulse: 79  Temp: 97.1 F (36.2 C)  TempSrc: Oral  Height: 5' 7.5" (1.715 m)  Weight: 202 lb 9.6 oz (91.899 kg)  SpO2: 97%   Constitutional: Vital signs reviewed.  Patient is a well-developed and well-nourished male in no acute distress and cooperative with exam. Alert and oriented x3.  Mouth: no erythema or exudates, MMM Eyes: PERRL, EOMI, conjunctivae normal, No scleral icterus.  Neck: Supple,  Cardiovascular: RRR, S1 normal, S2 normal, no MRG, pulses symmetric and intact bilaterally Pulmonary/Chest: CTAB, no wheezes, rales, or rhonchi Abdominal: Soft. Non-tender, non-distended, bowel sounds are normal, Musculoskeletal: No joint deformities, erythema, or stiffness, ROM full and no nontender Hematology: no cervical adenopathy.  Neurological: A&O x3, Strength is normal and symmetric bilaterally, cranial nerve II-XII are grossly intact, no focal motor deficit, sensory intact to light touch bilaterally.  Skin: Warm, dry and intact. No rash, cyanosis, or clubbing.

## 2012-07-28 LAB — COMPLETE METABOLIC PANEL WITH GFR
ALT: 18 U/L (ref 0–53)
AST: 14 U/L (ref 0–37)
Alkaline Phosphatase: 108 U/L (ref 39–117)
CO2: 28 mEq/L (ref 19–32)
Creat: 1.08 mg/dL (ref 0.50–1.35)
GFR, Est African American: >89 mL/min
Sodium: 139 mEq/L (ref 135–145)
Total Bilirubin: 0.3 mg/dL (ref 0.3–1.2)
Total Protein: 7.4 g/dL (ref 6.0–8.3)

## 2012-07-30 ENCOUNTER — Other Ambulatory Visit: Payer: Self-pay | Admitting: Internal Medicine

## 2012-07-30 DIAGNOSIS — E785 Hyperlipidemia, unspecified: Secondary | ICD-10-CM | POA: Insufficient documentation

## 2012-07-30 MED ORDER — PRAVASTATIN SODIUM 40 MG PO TABS: 40.0000 mg | ORAL_TABLET | Freq: Every evening | ORAL | Status: DC

## 2012-07-30 NOTE — Progress Notes (Signed)
INTERNAL MEDICINE TEACHING ATTENDING ADDENDUM: I discussed this case with Dr. Illath soon after the patient visit. I have read the documentation and I agree with the plan of care. Please see the resident note for details of management.  

## 2013-01-24 ENCOUNTER — Other Ambulatory Visit: Payer: Self-pay

## 2013-02-22 ENCOUNTER — Encounter: Payer: Self-pay | Admitting: Internal Medicine

## 2013-02-22 ENCOUNTER — Ambulatory Visit (INDEPENDENT_AMBULATORY_CARE_PROVIDER_SITE_OTHER): Payer: 59 | Admitting: Internal Medicine

## 2013-02-22 VITALS — BP 142/92 | HR 88 | Temp 97.8°F | Ht 67.0 in | Wt 196.7 lb

## 2013-02-22 DIAGNOSIS — E119 Type 2 diabetes mellitus without complications: Secondary | ICD-10-CM | POA: Insufficient documentation

## 2013-02-22 DIAGNOSIS — G8929 Other chronic pain: Secondary | ICD-10-CM

## 2013-02-22 DIAGNOSIS — D869 Sarcoidosis, unspecified: Secondary | ICD-10-CM

## 2013-02-22 DIAGNOSIS — E785 Hyperlipidemia, unspecified: Secondary | ICD-10-CM

## 2013-02-22 DIAGNOSIS — M25569 Pain in unspecified knee: Secondary | ICD-10-CM

## 2013-02-22 DIAGNOSIS — I10 Essential (primary) hypertension: Secondary | ICD-10-CM

## 2013-02-22 DIAGNOSIS — J302 Other seasonal allergic rhinitis: Secondary | ICD-10-CM

## 2013-02-22 DIAGNOSIS — R7303 Prediabetes: Secondary | ICD-10-CM

## 2013-02-22 DIAGNOSIS — E669 Obesity, unspecified: Secondary | ICD-10-CM

## 2013-02-22 DIAGNOSIS — R7309 Other abnormal glucose: Secondary | ICD-10-CM

## 2013-02-22 LAB — LIPID PANEL
HDL: 31 mg/dL — ABNORMAL LOW (ref >39)
LDL Cholesterol: 141 mg/dL — ABNORMAL HIGH (ref 0–99)

## 2013-02-22 LAB — BASIC METABOLIC PANEL WITH GFR
BUN: 14 mg/dL (ref 6–23)
Calcium: 9.6 mg/dL (ref 8.4–10.5)
Chloride: 103 mEq/L (ref 96–112)
Creat: 1.02 mg/dL (ref 0.50–1.35)
GFR, Est Non African American: 86 mL/min

## 2013-02-22 LAB — GLUCOSE, CAPILLARY: Glucose-Capillary: 92 mg/dL (ref 70–99)

## 2013-02-22 LAB — POCT GLYCOSYLATED HEMOGLOBIN (HGB A1C): Hemoglobin A1C: 6.3

## 2013-02-22 LAB — TSH: TSH: 2.325 u[IU]/mL (ref 0.350–4.500)

## 2013-02-22 MED ORDER — MOMETASONE FUROATE 50 MCG/ACT NA SUSP: 2.0000 | Freq: Every day | NASAL | Status: DC

## 2013-02-22 MED ORDER — ALBUTEROL SULFATE HFA 108 (90 BASE) MCG/ACT IN AERS: 2.0000 | INHALATION_SPRAY | Freq: Four times a day (QID) | RESPIRATORY_TRACT | Status: DC | PRN

## 2013-02-22 MED ORDER — FLUTICASONE-SALMETEROL 250-50 MCG/DOSE IN AEPB: INHALATION_SPRAY | RESPIRATORY_TRACT | Status: DC

## 2013-02-22 MED ORDER — HYDROCHLOROTHIAZIDE 25 MG PO TABS: 25.0000 mg | ORAL_TABLET | Freq: Every day | ORAL | Status: DC

## 2013-02-22 MED ORDER — LOSARTAN POTASSIUM 100 MG PO TABS: 100.0000 mg | ORAL_TABLET | Freq: Every day | ORAL | Status: DC

## 2013-02-22 NOTE — Progress Notes (Signed)
   Patient: Collin Lopez   MRN: [id number]  DOB: 14-Jun-1965  PCP: Dede Query, MD   Subjective:    CC: Knee Pain   HPI: [name] is a 47 y.o. male with a PMHx of HTN, HLD, OSA on CPAP, chronic lower leg pain, GERD, Sarcoidosis managed by Dr. Delford Field, who presented to clinic today for the following:  # chronic right Knee pain, stable.  see assessment and plan      # Pulmonary sarcoidosis, stable, see assessment and plan    # Hypertension    Patient is noted to have elevated BP at the clinic. He states that his BP runs about 145/90's at home. He reports medical compliance with his HCTZ and Losartan.   # Prediabetes:   He states that he feels tired at times, endorses thirst, polydipsia and polyuria. Denies blurring vision or hands/feet numbness/tingling. His A1C was 6.3 in May this year.    Review of Systems: Per HPI.   Current Outpatient Medications: Current Outpatient Prescriptions  Medication Sig Dispense Refill  . albuterol (PROAIR HFA) 108 (90 BASE) MCG/ACT inhaler Inhale 2 puffs into the lungs every 6 (six) hours as needed for wheezing or shortness of breath. For shortness of breath  1 Inhaler  11  . aspirin 81 MG tablet Take 1 tablet (81 mg total) by mouth daily.  30 tablet    . cetirizine (ZYRTEC) 10 MG tablet Take 1 tablet (10 mg total) by mouth daily.  30 tablet  2  . Fluticasone-Salmeterol (ADVAIR DISKUS) 250-50 MCG/DOSE AEPB Inhale 1 puff into the lungs every 12 hours.  60 each  11  . hydrochlorothiazide (HYDRODIURIL) 25 MG tablet Take 1 tablet (25 mg total) by mouth daily.  90 tablet  4  . mometasone (NASONEX) 50 MCG/ACT nasal spray Place 2 sprays into the nose daily.  17 g  11  . losartan (COZAAR) 100 MG tablet Take 1 tablet (100 mg total) by mouth daily.  90 tablet  4  . pravastatin (PRAVACHOL) 40 MG tablet Take 1 tablet (40 mg total) by mouth every evening.  30 tablet  11   No current facility-administered medications for this visit.    Allergies: No  Known Allergies  Past Medical History  Diagnosis Date  . Sarcoidosis   . Esophageal reflux   . Unspecified essential hypertension   . Tear of medial cartilage or meniscus of knee, current   . Congenital pes planus   . Abnormality of gait   . Pain in joint, lower leg   . OSA on CPAP     Objective:    Physical Exam: Filed Vitals:   02/22/13 1049 02/22/13 1246  BP: 144/89 142/92  Pulse: 88   Temp: 97.8 F (36.6 C)   TempSrc: Oral   Height: 5\' 7"  (1.702 m)   Weight: 196 lb 11.2 oz (89.223 kg)   SpO2: 100%     Central obesity General: Vital signs reviewed and noted. Well-developed, well-nourished, in no acute distress; alert, appropriate and cooperative throughout examination.  Head: Normocephalic, atraumatic.  Lungs:  Normal respiratory effort. Clear to auscultation BL without crackles or wheezes.  Heart: RRR. S1 and S2 normal without gallop, rubs, murmur.  Abdomen:  BS normoactive. Soft, Nondistended, non-tender.  No masses or organomegaly.  Extremities: No pretibial edema.   Assessment/ Plan:

## 2013-02-22 NOTE — Patient Instructions (Signed)
1. Check you routine blood work today 2. followup with your sports medicine and Lung doctor 3. Follow up in 2-3 months.

## 2013-02-22 NOTE — Assessment & Plan Note (Addendum)
Assessment The clinical manifestations is suggestive of prediabetes with A1c of 6.3 today.  Plan -Lifestyle change  Dietary education, calorie count, portion control  Exercise  Weight loss   - DM/Nutritional consult  - monitor A1C in 3 months.

## 2013-02-22 NOTE — Assessment & Plan Note (Signed)
BP Readings from Last 3 Encounters:  02/22/13 142/92  07/27/12 139/93  05/02/12 122/76    Lab Results  Component Value Date   Collin Lopez 139 07/27/2012   K 4.0 07/27/2012   CREATININE 1.08 07/27/2012    Assessment: Blood pressure control:  mildly elevated Progress toward BP goal:   unchanged He is on HCTZ 25 mg po daily and Losartan 50 mg po daily. Reports medical compliance.  Plan: Medications:  increase losartan to 100 mg po daily                        Continue HCTZ                        Follow up BMP in 2 weeks. ( order placed).  Educational resources provided: brochure Self management tools provided: home blood pressure logbook

## 2013-02-22 NOTE — Assessment & Plan Note (Addendum)
Assessment He has a history of Pulmonary sarcoidosis and been managed by [facility] Dr. Delford Field. He states that his Sarcoidosis is under control. He reports medical compliance to his inhalers.   Plan - continue to follow up with Dr. Delford Field.  - refill on albuterol and Advair inhalers given

## 2013-02-22 NOTE — Assessment & Plan Note (Signed)
Assessment:  His BMI is ~ 30.   Plan: - patient is counseled to loss weight. - will arrange for him to discuss with our diabetic/nutritional educator on dietary education,calorie count and a portion control. - Given his pulmonary sarcoidosis and chronic knee pain, it is difficult for him to engage intensive exercise plan. Will defer to Dr. Delford Field to see if he could have pulmonary rehab.

## 2013-02-22 NOTE — Assessment & Plan Note (Signed)
Assessment  Patient has had chronic right knee pain managed by Sports medicine. His right knee pain is thought to be related to his right foot structure ( Bilateral feet significant gait abnormality pes planus, overpronation, in bunion). He states that his knee pain is at baseline.  He wears custom orthotics.  Plan: - continue to follow up with your sports medicine doctor.

## 2013-02-23 ENCOUNTER — Encounter: Payer: Self-pay | Admitting: Critical Care Medicine

## 2013-02-23 ENCOUNTER — Encounter: Payer: Self-pay | Admitting: Internal Medicine

## 2013-02-25 MED ORDER — ATORVASTATIN CALCIUM 40 MG PO TABS: 40.0000 mg | ORAL_TABLET | Freq: Every day | ORAL | Status: DC

## 2013-02-25 NOTE — Assessment & Plan Note (Signed)
Lipid Panel     Component Value Date/Time   CHOL 205* 02/22/2013 1142   TRIG 167* 02/22/2013 1142   HDL 31* 02/22/2013 1142   CHOLHDL 6.6 02/22/2013 1142   VLDL 33 02/22/2013 1142   LDLCALC 141* 02/22/2013 1142   Assessment: Patient has been on Pravastatin for years and reports medical compliance. His repeat LDL is not much improved.  Plan: - stop Pravastatin and initiate more potent statin - Lipitor 40 mg po daily - patient is called and discussed about above changes.

## 2013-02-25 NOTE — Addendum Note (Signed)
Addended byDierdre Searles, Isahi Godwin on: 02/25/2013 12:34 PM   Modules accepted: Orders, Medications

## 2013-02-27 ENCOUNTER — Encounter: Payer: Self-pay | Admitting: Dietician

## 2013-02-27 ENCOUNTER — Ambulatory Visit (INDEPENDENT_AMBULATORY_CARE_PROVIDER_SITE_OTHER): Payer: 59 | Admitting: Dietician

## 2013-02-27 VITALS — Wt 197.5 lb

## 2013-02-27 DIAGNOSIS — R7303 Prediabetes: Secondary | ICD-10-CM

## 2013-02-27 DIAGNOSIS — R7309 Other abnormal glucose: Secondary | ICD-10-CM

## 2013-02-27 NOTE — Progress Notes (Signed)
Medical Nutrition Therapy:  Appt start time: 1630 end time:  1700.  Assessment:  Primary concerns today: Weight management and Blood sugar control.  Patient is here to work on lifestykle change to preventing diabetes.A1c was 6.3% twice in last 6 months.  His lipid panel shows low HDL and increased TG and LDL that can be indicative of insulin resistance.  He reports he has started walking 60 minutes at least 3 times a week on top of an active job. He also is trying to decrease his portions and eat healthier foods. Usual eating pattern includes 3 meals and 0-1 snacks per day. He reports ~ 30# weight gain since 2006 from weight of 176#. Highest weight 200s last year and goal weight is 165#.  Weight today 197#. - without Frequent foods include fruit, sandwiches, steamed vegetables. Soda, juice  Avoided foods include large portions of milk or other foods with lactose 24-hr recall: B ( AM)-  Fruit or whole grain toast with 2 slice bacon or honey nut cheerios with 2% milk  L ( PM)- 6" chicken subway sand on whole grain with light mayo, water or tuna with a few crackers    D ( PM)- steamed vegetables, grilled or broiled meats    Progress Towards Goal(s):  In progress.   Nutritional Diagnosis:  NB-1.1 Food and nutrition-related knowledge deficit As related to lack of prior exposure to meal planning to prevent diabetes.  As evidenced by his report and questions. .    Intervention:  Nutrition education about pre diabetes and meal planning for same. Discussed goal setting to assist him in making enough change to prevent diabetes. His goal is to buy a scale so he can keep track of his weight better.   Monitoring/Evaluation:  Dietary intake, exercise, and body weight in 1 week(s).

## 2013-03-04 ENCOUNTER — Ambulatory Visit (INDEPENDENT_AMBULATORY_CARE_PROVIDER_SITE_OTHER): Payer: 59 | Admitting: Sports Medicine

## 2013-03-04 ENCOUNTER — Encounter: Payer: Self-pay | Admitting: Sports Medicine

## 2013-03-04 ENCOUNTER — Ambulatory Visit (HOSPITAL_COMMUNITY)
Admission: RE | Admit: 2013-03-04 | Discharge: 2013-03-04 | Disposition: A | Discharge status: Home / Self Care | Payer: 59 | Source: Ambulatory Visit | Attending: Sports Medicine | Admitting: Sports Medicine

## 2013-03-04 VITALS — BP 144/86 | Ht 67.0 in | Wt 197.0 lb

## 2013-03-04 DIAGNOSIS — M25551 Pain in right hip: Secondary | ICD-10-CM

## 2013-03-04 DIAGNOSIS — M25561 Pain in right knee: Secondary | ICD-10-CM

## 2013-03-04 DIAGNOSIS — M545 Low back pain, unspecified: Secondary | ICD-10-CM | POA: Insufficient documentation

## 2013-03-04 DIAGNOSIS — M25559 Pain in unspecified hip: Secondary | ICD-10-CM | POA: Insufficient documentation

## 2013-03-04 DIAGNOSIS — M25569 Pain in unspecified knee: Secondary | ICD-10-CM

## 2013-03-04 MED ORDER — KETOROLAC TROMETHAMINE 60 MG/2ML IM SOLN
60.0000 mg | Freq: Once | INTRAMUSCULAR | Status: AC
  Administered 2013-03-04: 60 mg via INTRAMUSCULAR

## 2013-03-04 MED ORDER — METHYLPREDNISOLONE ACETATE 80 MG/ML IJ SUSP
80.0000 mg | Freq: Once | INTRAMUSCULAR | Status: AC
  Administered 2013-03-04: 80 mg via INTRAMUSCULAR

## 2013-03-05 NOTE — Progress Notes (Signed)
   Subjective:    Patient ID: Collin Lopez, male    DOB: 1965/03/23, 47 y.o.   MRN: [id number]  HPI chief complaint: Right leg pain Patient comes in today complaining of one week of right leg pain. He is unable to localize it to any one specific area. He states that he gets discomfort when he externally rotates his right foot. Pain seems to begin in the hip and radiates down the leg. Pain was significant last week but has improved some with rest over the weekend. No numbness and tingling. No swelling.    Review of Systems     Objective:   Physical Exam Well-developed, well-nourished. No acute distress. Awake alert and oriented x3. Sitting comfortable in exam room.  Right hip: Smooth painless hip range of motion with a negative log roll. Negative straight leg raise. Lumbar spine: Good lumbar mobility Right knee: Full range of motion. No effusion. No tenderness to palpation. Right ankle: Full range of motion. No effusion. No tenderness to palpation. Neurovascular intact distally. No focal strength deficits of either lower extremity. Walking without a significant limp.  X-rays of his lumbar spine and right hip are reviewed. They are unremarkable. Nothing acute. No significant degenerative changes.       Assessment & Plan:  Diffuse right leg pain of unknown etiology  80 mg of Depo-Medrol IM. 60 mg of Toradol IM. Patient can begin to increase activity as tolerated. If symptoms do not resolve over the next week or so he will return to the office for reevaluation and further workup. Otherwise, followup when necessary.

## 2013-03-07 ENCOUNTER — Encounter: Payer: Self-pay | Admitting: Dietician

## 2013-03-08 ENCOUNTER — Other Ambulatory Visit (INDEPENDENT_AMBULATORY_CARE_PROVIDER_SITE_OTHER): Payer: 59

## 2013-03-08 DIAGNOSIS — I10 Essential (primary) hypertension: Secondary | ICD-10-CM

## 2013-03-08 LAB — BASIC METABOLIC PANEL WITH GFR
Chloride: 104 mEq/L (ref 96–112)
GFR, Est African American: 88 mL/min
GFR, Est Non African American: 76 mL/min
Glucose, Bld: 98 mg/dL (ref 70–99)
Potassium: 3.9 mEq/L (ref 3.5–5.3)
Sodium: 141 mEq/L (ref 135–145)

## 2013-03-09 NOTE — Progress Notes (Signed)
Case discussed with Dr. Li soon after the resident saw the patient. We reviewed the resident's history and exam and pertinent patient test results. I agree with the assessment, diagnosis, and plan of care documented in the resident's note. 

## 2013-03-25 ENCOUNTER — Encounter: Payer: Self-pay | Admitting: Critical Care Medicine

## 2013-03-25 ENCOUNTER — Ambulatory Visit (INDEPENDENT_AMBULATORY_CARE_PROVIDER_SITE_OTHER): Payer: 59 | Admitting: Critical Care Medicine

## 2013-03-25 VITALS — BP 130/86 | HR 80 | Temp 97.6°F | Ht 67.5 in | Wt 196.0 lb

## 2013-03-25 DIAGNOSIS — D869 Sarcoidosis, unspecified: Secondary | ICD-10-CM

## 2013-03-25 NOTE — Progress Notes (Signed)
Subjective:    Patient ID: Collin Lopez, male    DOB: 1966-01-08, 48 y.o.   MRN: [id number]  HPI   48 y.o.  05/02/2012 Last seen 2010/06/26.  Pt notes more dyspnea and cough. Pt felt change in weather was a trigger. Chest is heavy. Sinus are stopped up. Notes some pndrip.  Mucus is green . Notes some green out of nose. Notes more dyspnea.  Notes some headache.  There is increased sinus drainage and sinus pressure noted. There is no fever chills or sweats noted  03/25/2013 Chief Complaint  Patient presents with  . Follow-up    Breathing has improved.  Coughing at times - nonprod.  Is able to work out with no SOB at this time.  No wheezing.    Not much cough.  Not wheezing. Not short of breath.  No abx for sinuses. Pt denies any significant sore throat, nasal congestion or excess secretions, fever, chills, sweats, unintended weight loss, pleurtic or exertional chest pain, orthopnea PND, or leg swelling Pt denies any increase in rescue therapy over baseline, denies waking up needing it or having any early am or nocturnal exacerbations of coughing/wheezing/or dyspnea. Pt also denies any obvious fluctuation in symptoms with  weather or environmental change or other alleviating or aggravating factors    Past Medical History  Diagnosis Date  . Sarcoidosis   . Esophageal reflux   . Unspecified essential hypertension   . Tear of medial cartilage or meniscus of knee, current   . Congenital pes planus   . Abnormality of gait   . Pain in joint, lower leg   . OSA on CPAP      Family History  Problem Relation Age of Onset  . Heart disease Father     Died 3 years ago due to CHF and refused pacemaker.  . Cancer Mother     breast cancer passed away in 2009-06-25.     History   Social History  . Marital Status: Married    Spouse Name: Duell Holdren    Number of Children: N/A  . Years of Education: 10   Occupational History  . Environmental services   .  [place] [employer]   Social History Main  Topics  . Smoking status: Former Smoker -- 0.20 packs/day for 3 years    Types: Cigarettes    Quit date: 03/21/1993  . Smokeless tobacco: Never Used     Comment: 1ppd x 2 years  . Alcohol Use: No  . Drug Use: No  . Sexual Activity: Yes    Partners: Female   Other Topics Concern  . 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           No Known Allergies   Outpatient Prescriptions Prior to Visit  Medication Sig Dispense Refill  . albuterol (PROAIR HFA) 108 (90 BASE) MCG/ACT inhaler Inhale 2 puffs into the lungs every 6 (six) hours as needed for wheezing or shortness of breath. For shortness of breath  1 Inhaler  11  . aspirin 81 MG tablet Take 1 tablet (81 mg total) by mouth daily.  30 tablet    . atorvastatin (LIPITOR) 40 MG tablet Take 1 tablet (40 mg total) by mouth daily.  30 tablet  11  . cetirizine (ZYRTEC) 10 MG tablet Take 1 tablet (10 mg total) by mouth daily.  30 tablet  2  . Fluticasone-Salmeterol (ADVAIR DISKUS) 250-50 MCG/DOSE AEPB Inhale 1  puff into the lungs every 12 hours.  60 each  11  . hydrochlorothiazide (HYDRODIURIL) 25 MG tablet Take 1 tablet (25 mg total) by mouth daily.  90 tablet  4  . losartan (COZAAR) 100 MG tablet Take 1 tablet (100 mg total) by mouth daily.  90 tablet  4  . mometasone (NASONEX) 50 MCG/ACT nasal spray Place 2 sprays into the nose daily.  17 g  11   No facility-administered medications prior to visit.     Review of Systems  Constitutional:   No  weight loss, night sweats,  Fevers, chills, fatigue, lassitude. HEENT:   Notes  headaches,  Difficulty swallowing,  Tooth/dental problems,++  Sore throat,                No sneezing, itching, ear ache, +++nasal congestion,+++ post nasal drip,   CV:  No chest pain,  Orthopnea, PND, swelling in lower extremities, anasarca, dizziness, palpitations  GI  No heartburn, indigestion, abdominal pain, nausea, vomiting, diarrhea, change in bowel habits, loss  of appetite  Resp: Notes  shortness of breath with exertion and  at rest.  No excess mucus, notes  productive cough,  No non-productive cough,  No coughing up of blood.  No change in color of mucus.  No wheezing.  No chest wall deformity  Skin: no rash or lesions.  GU: no dysuria, change in color of urine, no urgency or frequency.  No flank pain.  MS:  No joint pain or swelling.  No decreased range of motion.  No back pain.  Psych:  No change in mood or affect. No depression or anxiety.  No memory loss.     Objective:   Physical Exam  Filed Vitals:   03/25/13 0904  BP: 130/86  Pulse: 80  Temp: 97.6 F (36.4 C)  TempSrc: Oral  Height: 5' 7.5" (1.715 m)  Weight: 196 lb (88.905 kg)  SpO2: 97%    Gen: Pleasant, well-nourished, in no distress,  normal affect  ENT: No lesions,  mouth clear,  oropharynx clear, +++postnasal drip, bilateral nasal purulence  Neck: No JVD, no TMG, no carotid bruits  Lungs: No use of accessory muscles, no dullness to percussion, exp wheeze   Cardiovascular: RRR, heart sounds normal, no murmur or gallops, no peripheral edema  Abdomen: soft and NT, no HSM,  BS normal  Musculoskeletal: No deformities, no cyanosis or clubbing  Neuro: alert, non focal  Skin: Warm, no lesions or rashes      Assessment & Plan:   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 albuterol (PROAIR HFA) 108 (90 BASE) MCG/ACT inhaler Inhale 2 puffs into the lungs every 6 (six) hours as needed for wheezing or shortness of breath. For shortness of breath  . aspirin 81 MG tablet Take 1 tablet (81 mg total) by mouth daily.  Marland Kitchen atorvastatin (LIPITOR) 40 MG tablet Take 1 tablet (40 mg total) by mouth daily.  . cetirizine (ZYRTEC) 10 MG tablet Take 1 tablet  (10 mg total) by mouth daily.  . Fluticasone-Salmeterol (ADVAIR DISKUS) 250-50 MCG/DOSE AEPB Inhale 1 puff into the lungs every 12 hours.  . hydrochlorothiazide (HYDRODIURIL) 25 MG tablet Take 1 tablet (25 mg total) by mouth daily.  Marland Kitchen losartan (COZAAR) 100 MG tablet Take 1 tablet (100 mg total) by mouth daily.  Marland Kitchen  mometasone (NASONEX) 50 MCG/ACT nasal spray Place 2 sprays into the nose daily.

## 2013-03-25 NOTE — Assessment & Plan Note (Signed)
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

## 2013-03-25 NOTE — Patient Instructions (Signed)
No changes in medication Have a good year REturn one year or as needed

## 2013-04-01 ENCOUNTER — Telehealth: Payer: Self-pay | Admitting: Critical Care Medicine

## 2013-04-01 NOTE — Telephone Encounter (Signed)
Received FMLA forms from [facility] on this pt.   PW has signed the completed forms. I sent back down to [facility].

## 2013-04-22 ENCOUNTER — Encounter: Payer: Self-pay | Admitting: Internal Medicine

## 2013-04-22 ENCOUNTER — Ambulatory Visit (INDEPENDENT_AMBULATORY_CARE_PROVIDER_SITE_OTHER): Payer: 59 | Admitting: Internal Medicine

## 2013-04-22 VITALS — BP 127/84 | HR 77 | Temp 98.2°F | Ht 67.0 in | Wt 193.0 lb

## 2013-04-22 DIAGNOSIS — R7309 Other abnormal glucose: Secondary | ICD-10-CM

## 2013-04-22 DIAGNOSIS — R7303 Prediabetes: Secondary | ICD-10-CM

## 2013-04-22 DIAGNOSIS — E669 Obesity, unspecified: Secondary | ICD-10-CM

## 2013-04-22 DIAGNOSIS — I10 Essential (primary) hypertension: Secondary | ICD-10-CM

## 2013-04-22 NOTE — Patient Instructions (Addendum)
1. Continue life style changes and lose 3-5 lbs per month 2. Follow up in 3 months.   Exercise to Lose Weight Exercise and a healthy diet may help you lose weight. Your doctor may suggest specific exercises. EXERCISE IDEAS AND TIPS  Choose low-cost things you enjoy doing, such as walking, bicycling, or exercising to workout videos.  Take stairs instead of the elevator.  Walk during your lunch break.  Park your car further away from work or school.  Go to a gym or an exercise class.  Start with 5 to 10 minutes of exercise each day. Build up to 30 minutes of exercise 4 to 6 days a week.  Wear shoes with good support and comfortable clothes.  Stretch before and after working out.  Work out until you breathe harder and your heart beats faster.  Drink extra water when you exercise.  Do not do so much that you hurt yourself, feel dizzy, or get very short of breath. Exercises that burn about 150 calories:  Running 1  miles in 15 minutes.  Playing volleyball for 45 to 60 minutes.  Washing and waxing a car for 45 to 60 minutes.  Playing touch football for 45 minutes.  Walking 1  miles in 35 minutes.  Pushing a stroller 1  miles in 30 minutes.  Playing basketball for 30 minutes.  Raking leaves for 30 minutes.  Bicycling 5 miles in 30 minutes.  Walking 2 miles in 30 minutes.  Dancing for 30 minutes.  Shoveling snow for 15 minutes.  Swimming laps for 20 minutes.  Walking up stairs for 15 minutes.  Bicycling 4 miles in 15 minutes.  Gardening for 30 to 45 minutes.  Jumping rope for 15 minutes.  Washing windows or floors for 45 to 60 minutes. Document Released: 04/09/2010 Document Revised: 05/30/2011 Document Reviewed: 04/09/2010 [facility] Patient Information 2014 [place], [place].

## 2013-04-22 NOTE — Assessment & Plan Note (Signed)
Assessment: Continues to lose weigh. His BMI is ~30.   Plan: - he continues to lose weight by lifestyle changes.  - his goal is to lose 3-5 lbs monthly.  - follow up in 3 month.

## 2013-04-22 NOTE — Assessment & Plan Note (Signed)
Assessment: His HGB A1C was 6.3 on 02/22/13. He started lifestyle changes and discussed with the DM educator about the diet changes. He continues to exercise and walks several miles daily.   Plan: - continue current lifestyle changes. - monitor HGB A1C in March 2015.

## 2013-04-22 NOTE — Progress Notes (Signed)
   Patient: Collin Lopez   MRN: [id number]  DOB: November 16, 1965  PCP: Charlann Lange, MD   Subjective:    CC: Follow-up   HPI: [name] is a 48 y.o. male with a PMHx of HTN, HLD, OSA on CPAP, chronic lower leg pain, GERD, Sarcoidosis managed by Dr. Joya Gaskins, who presented to clinic today for the following:   Patient states that he is doing well. He continue lifestyle changes. And continues to lose weight. He reports medical compliance.    Review of Systems: Per HPI.   Current Outpatient Medications: Current Outpatient Prescriptions  Medication Sig Dispense Refill  . albuterol (PROAIR HFA) 108 (90 BASE) MCG/ACT inhaler Inhale 2 puffs into the lungs every 6 (six) hours as needed for wheezing or shortness of breath. For shortness of breath  1 Inhaler  11  . aspirin 81 MG tablet Take 1 tablet (81 mg total) by mouth daily.  30 tablet    . atorvastatin (LIPITOR) 40 MG tablet Take 1 tablet (40 mg total) by mouth daily.  30 tablet  11  . cetirizine (ZYRTEC) 10 MG tablet Take 1 tablet (10 mg total) by mouth daily.  30 tablet  2  . Fluticasone-Salmeterol (ADVAIR DISKUS) 250-50 MCG/DOSE AEPB Inhale 1 puff into the lungs every 12 hours.  60 each  11  . hydrochlorothiazide (HYDRODIURIL) 25 MG tablet Take 1 tablet (25 mg total) by mouth daily.  90 tablet  4  . losartan (COZAAR) 100 MG tablet Take 1 tablet (100 mg total) by mouth daily.  90 tablet  4  . mometasone (NASONEX) 50 MCG/ACT nasal spray Place 2 sprays into the nose daily.  17 g  11   No current facility-administered medications for this visit.    Allergies: No Known Allergies  Past Medical History  Diagnosis Date  . Sarcoidosis   . Esophageal reflux   . Unspecified essential hypertension   . Tear of medial cartilage or meniscus of knee, current   . Congenital pes planus   . Abnormality of gait   . Pain in joint, lower leg   . OSA on CPAP     Objective:    Physical Exam: Filed Vitals:   04/22/13 0809  BP: 127/84  Pulse:  77  Temp: 98.2 F (36.8 C)  TempSrc: Oral  Height: 5\' 7"  (1.702 m)  Weight: 193 lb (87.544 kg)  SpO2: 98%   General: Vital signs reviewed and noted. Well-developed, well-nourished, in no acute distress; alert, appropriate and cooperative throughout examination.  Head: Normocephalic, atraumatic.  Lungs:  Normal respiratory effort. Clear to auscultation BL without crackles or wheezes.  Heart: RRR. S1 and S2 normal without gallop, rubs, murmur.  Abdomen:  BS normoactive. Soft, Nondistended, non-tender.  No masses or organomegaly.  Extremities: No pretibial edema.   Assessment/ Plan:

## 2013-04-22 NOTE — Assessment & Plan Note (Signed)
BP Readings from Last 3 Encounters:  04/22/13 127/84  03/25/13 130/86  03/04/13 144/86    Lab Results  Component Value Date   Collin Lopez 141 03/08/2013   K 3.9 03/08/2013   CREATININE 1.13 03/08/2013    Assessment: Blood pressure control: controlled Progress toward BP goal:  at goal   Plan: Medications:  continue current medications Educational resources provided: brochure Self management tools provided: home blood pressure logbook

## 2013-04-23 NOTE — Progress Notes (Signed)
Case discussed with Dr. Nicoletta Dress  at time of visit.  We reviewed the resident's history and exam and pertinent patient test results.  I agree with the assessment, diagnosis, and plan of care documented in the resident's note.

## 2013-05-30 ENCOUNTER — Other Ambulatory Visit: Payer: Self-pay | Admitting: Internal Medicine

## 2013-05-30 ENCOUNTER — Other Ambulatory Visit (INDEPENDENT_AMBULATORY_CARE_PROVIDER_SITE_OTHER): Payer: 59

## 2013-05-30 DIAGNOSIS — R7309 Other abnormal glucose: Secondary | ICD-10-CM

## 2013-05-30 DIAGNOSIS — R7303 Prediabetes: Secondary | ICD-10-CM

## 2013-05-30 LAB — POCT GLYCOSYLATED HEMOGLOBIN (HGB A1C): HEMOGLOBIN A1C: 6.3

## 2013-06-25 ENCOUNTER — Telehealth: Payer: Self-pay | Admitting: *Deleted

## 2013-06-25 NOTE — Telephone Encounter (Signed)
Refill request if for Cialis 20 mg Last filled 03/28/12 for # 5 Will you refill?

## 2013-06-25 NOTE — Telephone Encounter (Signed)
He does not have indication for this treatment. No refill will be given. He will need evaluation of ? ED at the clinic. Please schedule an appt.   Thanks  Nicoletta Dress

## 2013-06-27 NOTE — Telephone Encounter (Signed)
Pharmacy informed.

## 2013-07-19 ENCOUNTER — Encounter: Payer: Self-pay | Admitting: Family Medicine

## 2013-07-19 ENCOUNTER — Ambulatory Visit (INDEPENDENT_AMBULATORY_CARE_PROVIDER_SITE_OTHER): Payer: 59 | Admitting: Family Medicine

## 2013-07-19 VITALS — BP 148/92 | Ht 67.0 in | Wt 190.0 lb

## 2013-07-19 DIAGNOSIS — IMO0002 Reserved for concepts with insufficient information to code with codable children: Secondary | ICD-10-CM

## 2013-07-19 DIAGNOSIS — G8929 Other chronic pain: Secondary | ICD-10-CM

## 2013-07-19 DIAGNOSIS — M25569 Pain in unspecified knee: Secondary | ICD-10-CM

## 2013-07-19 NOTE — Progress Notes (Signed)
Patient ID: Collin Lopez, male   DOB: 04/29/1965, 48 y.o.   MRN: [id number] [place] Attending Note: I have seen and examined this patient. I have discussed this patient with the resident and reviewed the assessment and plan as documented above. I agree with the resident's findings and plan.  ULTRASOUND: He had a very small amount of fluid in the suprapatellar pouch and a little bit lining the area around the meniscus on both sides. The lateral meniscus reveals small extruded area with evidence of an old tear. There is no increased Doppler activity at this site. Medial meniscus is a little irregularly shaped but nothing seems acute. ASSESSMENT: I think he is aggravated all meniscal injury. In PLAN: He's had a lot of nasal really thought he would benefit from corticosteroid injection but he does not want to try that today so we'll try conservative therapy. This would include ice, compression, activity modification, Tylenol for pain. Should he change his mind would be happy see him back.

## 2013-07-19 NOTE — Patient Instructions (Signed)
It was great o meet you today!  You have aggravated an old tear in the meniscus of your knee.   Fo rnow you can try ice for 15 minutes every 2-3 hours as needed, also wear your brace, try to take it easy this weekend.

## 2013-07-19 NOTE — Progress Notes (Signed)
Patient ID: WYNSTON ROMEY, male   DOB: 1966/01/13, 48 y.o.   MRN: [id number]    [facility] [address] [place], [place] [address] Phone: [phone] Fax: [phone]   Patient Name: RANDEN KAUTH Date of Birth: [birth year] Medical Record Number: [id number] Gender: male Date of Encounter: 07/19/2013  History of Present Illness:  KANAN SOBEK is a 48 y.o. very pleasant male patient who presents with the following:  R knee pain for 5 days. He describes that he was having a dream on Monday after falling asleep in a chair when he kicked out with his r leg producing a snapping sensation and immediate sharp pain. Th epain is located in his lateral R knee and down his leg as well as his popliteal fossa and increases with straightening his knee.   He had swelling following the injury which has improved slowly through the week with NSAIDs and ice. He works as a Engineer, maintenance (IT) at [employer] and so walks all the time for work. He has had several knee injuries in his life and states that his knee was just beginning to feel better from his last injury.    Patient Active Problem List   Diagnosis Date Noted  . Obesity, unspecified 02/22/2013  . Prediabetes 02/22/2013  . Dyslipidemia 07/30/2012  . Seasonal allergies 07/27/2012  . Back pain 07/27/2012  . PULMONARY SARCOIDOSIS 06/22/2009  . HYPERTENSION 06/22/2009  . GERD 06/22/2009  . SLEEP APNEA 06/22/2009  . MEDIAL MENISCUS TEAR 05/05/2009  . Knee pain, chronic 01/15/2009  . PES PLANUS, CONGENITAL 01/15/2009   Past Medical History  Diagnosis Date  . Sarcoidosis   . Esophageal reflux   . Unspecified essential hypertension   . Tear of medial cartilage or meniscus of knee, current   . Congenital pes planus   . Abnormality of gait   . Pain in joint, lower leg   . OSA on CPAP    Past Surgical History  Procedure Laterality Date  . Knee surgery      left knee repair for medical meniscus tear    History  Substance Use Topics  . Smoking status: Former Smoker -- 0.20 packs/day for 3 years    Types: Cigarettes    Quit date: 03/21/1993  . Smokeless tobacco: Never Used     Comment: 1ppd x 2 years  . Alcohol Use: No   Family History  Problem Relation Age of Onset  . Heart disease Father     Died 3 years ago due to CHF and refused pacemaker.  . Cancer Mother     breast cancer passed away in 09-Jul-2009.   No Known Allergies  Medication list has been reviewed and updated.  Prior to Admission medications   Medication Sig Start Date End Date Taking? Authorizing Provider  albuterol (PROAIR HFA) 108 (90 BASE) MCG/ACT inhaler Inhale 2 puffs into the lungs every 6 (six) hours as needed for wheezing or shortness of breath. For shortness of breath 02/22/13   Charlann Lange, MD  aspirin 81 MG tablet Take 1 tablet (81 mg total) by mouth daily. 07/27/12   [name], MD  atorvastatin (LIPITOR) 40 MG tablet Take 1 tablet (40 mg total) by mouth daily. 02/25/13 02/25/14  Charlann Lange, MD  cetirizine (ZYRTEC) 10 MG tablet Take 1 tablet (10 mg total) by mouth daily. 07/27/12   [name], MD  Fluticasone-Salmeterol (ADVAIR DISKUS) 250-50 MCG/DOSE AEPB Inhale 1 puff into the lungs every 12 hours. 02/22/13  Charlann Lange, MD  hydrochlorothiazide (HYDRODIURIL) 25 MG tablet Take 1 tablet (25 mg total) by mouth daily. 02/22/13   Charlann Lange, MD  losartan (COZAAR) 100 MG tablet Take 1 tablet (100 mg total) by mouth daily. 02/22/13 02/22/14  Na Li, MD  mometasone (NASONEX) 50 MCG/ACT nasal spray Place 2 sprays into the nose daily. 02/22/13   Charlann Lange, MD    Review of Systems:  No fever, chills, sweats No Dyspnea No appetite change, n/v/d NO other joint pains  Physical Examination: Filed Vitals:   07/19/13 0930  BP: 148/92   Filed Vitals:   07/19/13 0929  Height: 5\' 7"  (1.702 m)  Weight: 190 lb (86.183 kg)   Body mass index is 29.75 kg/(m^2).  Gen: NAD, alert, cooperative with exam HEENT: NCAT Neuro: Alert and oriented, No  gross deficits MSK: R knee without gross effusion, no bruising or erythema. Ligamentously intact with ant drawer and lachmans, mild pain with McMurray's test, medial joint line tenderness, pain with patellar grind  Assessment and Plan: 48 y /o male here with R knee pain after kicking ionjury 5 days ago. After MSK [place] most likely etiology is an aggravation of an old lateral meniscus tear. Offered conservative Tx vs injection and he would like to pursue conservative for now.   Timmothy Euler, MD

## 2013-07-29 ENCOUNTER — Other Ambulatory Visit: Payer: Self-pay | Admitting: Internal Medicine

## 2013-07-29 ENCOUNTER — Telehealth: Payer: Self-pay | Admitting: *Deleted

## 2013-07-29 DIAGNOSIS — Z1211 Encounter for screening for malignant neoplasm of colon: Secondary | ICD-10-CM | POA: Insufficient documentation

## 2013-07-29 NOTE — Telephone Encounter (Signed)
Wants referral for colonoscopy - prefers to see [facility]. Hilda Blades Aaren Atallah RN 07/29/13 8:15AM

## 2013-07-29 NOTE — Telephone Encounter (Signed)
Open chart in error. Hilda Blades Damari Suastegui RN 07/29/13 10:20AM

## 2013-08-08 ENCOUNTER — Telehealth: Payer: Self-pay | Admitting: *Deleted

## 2013-08-08 MED ORDER — PANTOPRAZOLE SODIUM 40 MG PO TBEC: 40.0000 mg | DELAYED_RELEASE_TABLET | Freq: Every day | ORAL | Status: DC

## 2013-08-08 NOTE — Telephone Encounter (Signed)
Pt request refill on Pantoprazole 40 mg  Last filled 03/25/13 Not on med list.

## 2013-09-02 ENCOUNTER — Encounter: Payer: Self-pay | Admitting: Internal Medicine

## 2013-09-02 NOTE — Addendum Note (Signed)
Addended by: Hulan Fray on: 09/02/2013 02:54 PM   Modules accepted: Orders

## 2013-09-12 ENCOUNTER — Telehealth: Payer: Self-pay | Admitting: *Deleted

## 2013-09-12 NOTE — Telephone Encounter (Signed)
Pt stopped by office and states needs a refill on Cialis - states Dr Nicoletta Dress has given Rx in past. Pt uses [facility]. Hilda Blades Ditzler RN 09/12/13 11:40AM

## 2013-09-14 NOTE — Telephone Encounter (Signed)
Talked with pt and agreeable to wait for refill until seen in clinic once returns from vacation.   Thanks,   Dr. Naaman Plummer

## 2013-10-21 ENCOUNTER — Ambulatory Visit (INDEPENDENT_AMBULATORY_CARE_PROVIDER_SITE_OTHER): Payer: 59 | Admitting: Internal Medicine

## 2013-10-21 ENCOUNTER — Encounter: Payer: Self-pay | Admitting: Internal Medicine

## 2013-10-21 VITALS — BP 140/91 | HR 93 | Temp 97.3°F | Ht 67.0 in | Wt 197.6 lb

## 2013-10-21 DIAGNOSIS — I10 Essential (primary) hypertension: Secondary | ICD-10-CM

## 2013-10-21 DIAGNOSIS — D869 Sarcoidosis, unspecified: Secondary | ICD-10-CM

## 2013-10-21 DIAGNOSIS — J302 Other seasonal allergic rhinitis: Secondary | ICD-10-CM

## 2013-10-21 DIAGNOSIS — K219 Gastro-esophageal reflux disease without esophagitis: Secondary | ICD-10-CM

## 2013-10-21 DIAGNOSIS — J309 Allergic rhinitis, unspecified: Secondary | ICD-10-CM

## 2013-10-21 DIAGNOSIS — J069 Acute upper respiratory infection, unspecified: Secondary | ICD-10-CM

## 2013-10-21 MED ORDER — ALBUTEROL SULFATE HFA 108 (90 BASE) MCG/ACT IN AERS: 2.0000 | INHALATION_SPRAY | Freq: Four times a day (QID) | RESPIRATORY_TRACT | Status: DC | PRN

## 2013-10-21 MED ORDER — MOMETASONE FUROATE 50 MCG/ACT NA SUSP: 2.0000 | Freq: Every day | NASAL | Status: DC

## 2013-10-21 MED ORDER — PANTOPRAZOLE SODIUM 40 MG PO TBEC: 40.0000 mg | DELAYED_RELEASE_TABLET | Freq: Every day | ORAL | Status: DC

## 2013-10-21 MED ORDER — LOSARTAN POTASSIUM 100 MG PO TABS: 100.0000 mg | ORAL_TABLET | Freq: Every day | ORAL | Status: DC

## 2013-10-21 MED ORDER — CETIRIZINE HCL 10 MG PO TABS: 10.0000 mg | ORAL_TABLET | Freq: Every day | ORAL | Status: DC

## 2013-10-21 MED ORDER — HYDROCHLOROTHIAZIDE 25 MG PO TABS: 25.0000 mg | ORAL_TABLET | Freq: Every day | ORAL | Status: DC

## 2013-10-21 NOTE — Progress Notes (Signed)
Case discussed with Dr. Rabbani at the time of the visit.  We reviewed the resident's history and exam and pertinent patient test results.  I agree with the assessment, diagnosis, and plan of care documented in the resident's note. 

## 2013-10-21 NOTE — Patient Instructions (Addendum)
-Keep gargling with salt water, drinking fluids, and taking zyrtec, nasonex, advair, and ibuprofen as needed for pain -Will see you back in 1 week if your symptoms don't improve   Upper Respiratory Infection, Adult An upper respiratory infection (URI) is also sometimes known as the common cold. The upper respiratory tract includes the nose, sinuses, throat, trachea, and bronchi. Bronchi are the airways leading to the lungs. Most people improve within 1 week, but symptoms can last up to 2 weeks. A residual cough may last even longer.  CAUSES Many different viruses can infect the tissues lining the upper respiratory tract. The tissues become irritated and inflamed and often become very moist. Mucus production is also common. A cold is contagious. You can easily spread the virus to others by oral contact. This includes kissing, sharing a glass, coughing, or sneezing. Touching your mouth or nose and then touching a surface, which is then touched by another person, can also spread the virus. SYMPTOMS  Symptoms typically develop 1 to 3 days after you come in contact with a cold virus. Symptoms vary from person to person. They may include:  Runny nose.  Sneezing.  Nasal congestion.  Sinus irritation.  Sore throat.  Loss of voice (laryngitis).  Cough.  Fatigue.  Muscle aches.  Loss of appetite.  Headache.  Low-grade fever. DIAGNOSIS  You might diagnose your own cold based on familiar symptoms, since most people get a cold 2 to 3 times a year. Your caregiver can confirm this based on your exam. Most importantly, your caregiver can check that your symptoms are not due to another disease such as strep throat, sinusitis, pneumonia, asthma, or epiglottitis. Blood tests, throat tests, and X-rays are not necessary to diagnose a common cold, but they may sometimes be helpful in excluding other more serious diseases. Your caregiver will decide if any further tests are required. RISKS AND  COMPLICATIONS  You may be at risk for a more severe case of the common cold if you smoke cigarettes, have chronic heart disease (such as heart failure) or lung disease (such as asthma), or if you have a weakened immune system. The very young and very old are also at risk for more serious infections. Bacterial sinusitis, middle ear infections, and bacterial pneumonia can complicate the common cold. The common cold can worsen asthma and chronic obstructive pulmonary disease (COPD). Sometimes, these complications can require emergency medical care and may be life-threatening. PREVENTION  The best way to protect against getting a cold is to practice good hygiene. Avoid oral or hand contact with people with cold symptoms. Wash your hands often if contact occurs. There is no clear evidence that vitamin C, vitamin E, echinacea, or exercise reduces the chance of developing a cold. However, it is always recommended to get plenty of rest and practice good nutrition. TREATMENT  Treatment is directed at relieving symptoms. There is no cure. Antibiotics are not effective, because the infection is caused by a virus, not by bacteria. Treatment may include:  Increased fluid intake. Sports drinks offer valuable electrolytes, sugars, and fluids.  Breathing heated mist or steam (vaporizer or shower).  Eating chicken soup or other clear broths, and maintaining good nutrition.  Getting plenty of rest.  Using gargles or lozenges for comfort.  Controlling fevers with ibuprofen or acetaminophen as directed by your caregiver.  Increasing usage of your inhaler if you have asthma. Zinc gel and zinc lozenges, taken in the first 24 hours of the common cold, can shorten the duration  and lessen the severity of symptoms. Pain medicines may help with fever, muscle aches, and throat pain. A variety of non-prescription medicines are available to treat congestion and runny nose. Your caregiver can make recommendations and may  suggest nasal or lung inhalers for other symptoms.  HOME CARE INSTRUCTIONS   Only take over-the-counter or prescription medicines for pain, discomfort, or fever as directed by your caregiver.  Use a warm mist humidifier or inhale steam from a shower to increase air moisture. This may keep secretions moist and make it easier to breathe.  Drink enough water and fluids to keep your urine clear or pale yellow.  Rest as needed.  Return to work when your temperature has returned to normal or as your caregiver advises. You may need to stay home longer to avoid infecting others. You can also use a face mask and careful hand washing to prevent spread of the virus. SEEK MEDICAL CARE IF:   After the first few days, you feel you are getting worse rather than better.  You need your caregiver's advice about medicines to control symptoms.  You develop chills, worsening shortness of breath, or brown or red sputum. These may be signs of pneumonia.  You develop yellow or brown nasal discharge or pain in the face, especially when you bend forward. These may be signs of sinusitis.  You develop a fever, swollen neck glands, pain with swallowing, or white areas in the back of your throat. These may be signs of strep throat. SEEK IMMEDIATE MEDICAL CARE IF:   You have a fever.  You develop severe or persistent headache, ear pain, sinus pain, or chest pain.  You develop wheezing, a prolonged cough, cough up blood, or have a change in your usual mucus (if you have chronic lung disease).  You develop sore muscles or a stiff neck. Document Released: 08/31/2000 Document Revised: 05/30/2011 Document Reviewed: 06/12/2013 [facility] Patient Information 2015 [place], [place]. This information is not intended to replace advice given to you by your health care provider. Make sure you discuss any questions you have with your health care provider.  General Instructions:   Please try to bring all your medicines next  time. This will help [place] keep you safe from mistakes.   Progress Toward Treatment Goals:  Treatment Goal 04/22/2013  Blood pressure at goal    Self Care Goals & Plans:  Self Care Goal 10/21/2013  Manage my medications take my medicines as prescribed; bring my medications to every visit; refill my medications on time  Eat healthy foods drink diet soda or water instead of juice or soda; eat more vegetables; eat foods that are low in salt; eat baked foods instead of fried foods    No flowsheet data found.   Care Management & Community Referrals:  Referral 04/22/2013  Referrals made for care management support diabetes educator

## 2013-10-21 NOTE — Assessment & Plan Note (Addendum)
Assessment: Pt with history of seasonal allergic rhinitis with probable flare of symptoms after recent foreign travel due to weather change.   Plan:  -Refill nasonex 50 mcg nasal spray 2 sprays daily  -Refill cetrizine 10 mg daily  -Pt instructed to perform nasal saline irrigation

## 2013-10-21 NOTE — Assessment & Plan Note (Addendum)
Assessment: Pt with moderate to well-controlled hypertension compliant with two-class (diuretic, ARB) anti-hypertensive therapy who presents with blood pressure of 140/91 (reported not taking medications this morning).   Plan:  -BP 140/91 at goal <140/90 -Continue HCTZ 25 mg daily  -Continue losartan 100 mg daily -Last BMP normal on12/19/14, obtain BMP at next visit

## 2013-10-21 NOTE — Progress Notes (Signed)
Patient ID: Collin Lopez, male   DOB: February 05, 1966, 48 y.o.   MRN: [id number]     Subjective:   Patient ID: Collin Lopez male   DOB: 1965-08-21 48 y.o.   MRN: [id number]  HPI: [name] is a 48 y.o. pleasant man with past medical history of pulmonary sarcoidosis, hypertension, hyperlipidemia, allergic rhinitis, GERD, and OSA who presents with chief complaint of sore throat of three week duration.   He reports that he recently went to the [place] from [place] and started having symptoms of dry cough, sore throat, nasal congestion, sneezing, sinus pressure with right sided headache and earache on the last week of his vacation due to the weather change which has continued since returning. His wife also has similar symptoms of sore throat, dry cough, and nasal congestion. He denies fever, chills, tonsillar exudates, sinus tenderness, dyspnea, wheezing, chest pain, rash, malaise, decreased appetite, weight change, arthralgias, nausea, vomiting, abdominal pain, or change in BM.  He has been using allegra -D, chloroseptic wash, cough drops, saline nasal spray, gargling with salt water, and drinking fluids with some relief. 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   He has a history of allergic rhinitis and takes zyrtec and nasonex as needed. He has been compliant with taking advair daily and occasionally uses albuterol but not recently.     Past Medical History  Diagnosis Date  . Sarcoidosis   . Esophageal reflux   . Unspecified essential hypertension   . Tear of medial cartilage or meniscus of knee, current   . Congenital pes planus   . Abnormality of gait   . Pain in joint, lower leg   . OSA on CPAP    Current Outpatient Prescriptions  Medication Sig Dispense Refill  . albuterol (PROAIR HFA) 108 (90 BASE) MCG/ACT inhaler Inhale 2 puffs into the lungs every 6 (six) hours as needed for wheezing or shortness of breath. For shortness of breath  1  Inhaler  11  . aspirin 81 MG tablet Take 1 tablet (81 mg total) by mouth daily.  30 tablet    . atorvastatin (LIPITOR) 40 MG tablet Take 1 tablet (40 mg total) by mouth daily.  30 tablet  11  . cetirizine (ZYRTEC) 10 MG tablet Take 1 tablet (10 mg total) by mouth daily.  30 tablet  2  . Fluticasone-Salmeterol (ADVAIR DISKUS) 250-50 MCG/DOSE AEPB Inhale 1 puff into the lungs every 12 hours.  60 each  11  . hydrochlorothiazide (HYDRODIURIL) 25 MG tablet Take 1 tablet (25 mg total) by mouth daily.  90 tablet  4  . losartan (COZAAR) 100 MG tablet Take 1 tablet (100 mg total) by mouth daily.  90 tablet  4  . mometasone (NASONEX) 50 MCG/ACT nasal spray Place 2 sprays into the nose daily.  17 g  11  . pantoprazole (PROTONIX) 40 MG tablet Take 1 tablet (40 mg total) by mouth daily.  30 tablet  5   No current facility-administered medications for this visit.   Family History  Problem Relation Age of Onset  . Heart disease Father     Died 3 years ago due to CHF and refused pacemaker.  . Cancer Mother     breast cancer passed away in 07/10/2009.   History   Social History  . Marital Status: Married    Spouse Name: Mohd. Derflinger    Number of Children: N/A  . Years of Education: 10   Occupational  History  . Environmental services   .  [employer]   Social History Main Topics  . Smoking status: Former Smoker -- 0.20 packs/day for 3 years    Types: Cigarettes    Quit date: 03/21/1993  . Smokeless tobacco: Never Used     Comment: 1ppd x 2 years  . Alcohol Use: No  . Drug Use: No  . Sexual Activity: Yes    Partners: Female   Other Topics Concern  . 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         Review of Systems: Review of Systems  Constitutional: Negative for fever, chills, weight loss and malaise/fatigue.  HENT: Positive for congestion, ear pain (right ) and sore throat.        Sneezing  Respiratory: Positive for cough. Negative for  sputum production, shortness of breath and wheezing.   Cardiovascular: Positive for leg swelling (resolved). Negative for chest pain.  Gastrointestinal: Positive for constipation (resolved). Negative for nausea, vomiting, abdominal pain and diarrhea.  Genitourinary: Negative for dysuria, urgency and frequency.  Musculoskeletal: Negative for joint pain and myalgias.  Skin: Negative for rash.  Neurological: Positive for headaches (right sided). Negative for dizziness.    Objective:  Physical Exam: Filed Vitals:   10/21/13 0853  BP: 140/91  Pulse: 93  Temp: 97.3 F (36.3 C)  TempSrc: Oral  Height: 5\' 7"  (1.702 m)  Weight: 197 lb 9.6 oz (89.631 kg)  SpO2: 100%   Physical Exam  Constitutional: He is oriented to person, place, and time. He appears well-developed and well-nourished. No distress.  HENT:  Head: Normocephalic and atraumatic.  Right Ear: External ear normal.  Left Ear: External ear normal.  Mouth/Throat: No oropharyngeal exudate.  Mild erythema in posterior pharynx with no tonsillar exudates  Eyes: Conjunctivae and EOM are normal. Right eye exhibits no discharge. Left eye exhibits no discharge.  Neck: Normal range of motion. Neck supple.  Tenderness to palpation of anterior cervical lymph nodes  Cardiovascular: Normal rate, regular rhythm and normal heart sounds.   Pulmonary/Chest: Effort normal and breath sounds normal. No respiratory distress. He has no wheezes. He has no rales.  Abdominal: Soft. Bowel sounds are normal. He exhibits no distension. There is no tenderness. There is no rebound and no guarding.  Musculoskeletal: Normal range of motion. He exhibits no edema.  Lymphadenopathy:    He has no cervical adenopathy.  Neurological: He is alert and oriented to person, place, and time.  Skin: Skin is warm and dry. No rash noted. He is not diaphoretic. No erythema. No pallor.  Psychiatric: He has a normal mood and affect. His behavior is normal. Judgment and thought  content normal.    Assessment & Plan:   Please see problem list for problem based assessment and plan

## 2013-10-21 NOTE — Assessment & Plan Note (Addendum)
Assessment: Pt with history of seasonal allergic rhinitis and pulmonary sarcoidosis with recent foreign travel with onset of upper respiratory symptoms of close to 3 week duration most likely due to viral upper respiratory infection and flare of seasonal allergies due to recent weather change.   Plan:  -No concern for GABHS pharyngitis or mononucleosis at this time -Symptomatic support for viral URI with fluids, salt-water gargling, ibuprofen PRN pain -Treat allergic rhinitis with cetrizine 10 mg daily, nasonex 2 sprays daily, and nasal saline irrigation  -Continue daily advair and albuterol inhaler 2 puffs PRN 6 hrs acute bronchospasm -Pt instructed to return in 1 week if symptoms do not improve or worsen and will consider antibiotics (azithromycin)

## 2013-10-21 NOTE — Assessment & Plan Note (Addendum)
Assessment: Pt with well-controlled acid reflux disease compliant with PPI therapy who presents with no alarm symptoms.   Plan:  -Continue pantoprazole 40 mg daily  -Continue to monitor for alarm symptoms

## 2013-10-28 ENCOUNTER — Ambulatory Visit (INDEPENDENT_AMBULATORY_CARE_PROVIDER_SITE_OTHER): Payer: 59 | Admitting: Internal Medicine

## 2013-10-28 ENCOUNTER — Encounter: Payer: Self-pay | Admitting: Internal Medicine

## 2013-10-28 VITALS — BP 146/97 | HR 80 | Temp 97.2°F | Ht 67.5 in | Wt 193.5 lb

## 2013-10-28 DIAGNOSIS — I10 Essential (primary) hypertension: Secondary | ICD-10-CM

## 2013-10-28 DIAGNOSIS — J069 Acute upper respiratory infection, unspecified: Secondary | ICD-10-CM

## 2013-10-28 MED ORDER — AMOXICILLIN-POT CLAVULANATE 875-125 MG PO TABS: 1.0000 | ORAL_TABLET | Freq: Two times a day (BID) | ORAL | Status: DC

## 2013-10-28 NOTE — Assessment & Plan Note (Addendum)
Assessment: Pt with history of seasonal allergic rhinitis and pulmonary sarcoidosis with recent foreign travel who presents with persistent upper respiratory symptoms of close to 4 week duration with partial relief with symptomatic support.     Plan:  -Prescribe Augmentin 875-125 mg BID for 7 days for probable superimposed bacterial on viral URI -No corticosteroid therapy indicated at this time -Continue encouraging adequate fluid intake, salt-water gargling, and ibuprofen PRN pain  -Continue to treat allergic rhinitis with cetrizine 10 mg daily, nasonex 2 sprays daily, and nasal saline irrigation  -Continue daily advair and albuterol inhaler 2 puffs PRN 6 hrs acute bronchospasm

## 2013-10-28 NOTE — Assessment & Plan Note (Addendum)
Assessment: Pt with moderate to well-controlled hypertension compliant with two-class (diuretic, ARB) anti-hypertensive therapy who presents with blood pressure of 146/97.  Plan:  -BP 146/97 near goal <140/90  -Continue HCTZ 25 mg daily and losartan 100 mg daily  -Last BMP normal on 03/08/13, obtain BMP at next visit

## 2013-10-28 NOTE — Patient Instructions (Signed)
-  Take augmentin twice a day for 7 days  General Instructions:   Please try to bring all your medicines next time. This will help [place] keep you safe from mistakes.   Progress Toward Treatment Goals:  Treatment Goal 04/22/2013  Blood pressure at goal    Self Care Goals & Plans:  Self Care Goal 10/28/2013  Manage my medications take my medicines as prescribed; bring my medications to every visit; refill my medications on time; follow the sick day instructions if I am sick  Monitor my health keep track of my blood pressure; keep track of my weight  Eat healthy foods eat more vegetables; eat fruit for snacks and desserts; eat baked foods instead of fried foods; eat foods that are low in salt; eat smaller portions  Be physically active find an activity I enjoy    No flowsheet data found.   Care Management & Community Referrals:  Referral 04/22/2013  Referrals made for care management support diabetes educator

## 2013-10-28 NOTE — Progress Notes (Signed)
Patient ID: Collin Lopez, male   DOB: [birth year], 48 y.o.   MRN: [id number]    Subjective:   Patient ID: Collin Lopez male   DOB: 01-Feb-1966 48 y.o.   MRN: [id number]  HPI: [name] is a 48 y.o. pleasant man with past medical history of pulmonary sarcoidosis, hypertension, hyperlipidemia, allergic rhinitis, GERD, and OSA who presents with chief complaint of persistent URI symptoms.    He was seen 1 week ago for URI symptoms and reports slowly improving symptoms with persistent sore throat, nasal congestion, and sinus pressure headache with occasional cough. He has salt water gargling, claritin, nasal irrigation, and drinking fluids. He denies fever, chills, dyspnea, wheezing, or sinus tenderness. He has been compliant with taking advair daily and occasionally uses albuterol but not recently.      Past Medical History  Diagnosis Date  . Sarcoidosis   . Esophageal reflux   . Unspecified essential hypertension   . Tear of medial cartilage or meniscus of knee, current   . Congenital pes planus   . Abnormality of gait   . Pain in joint, lower leg   . OSA on CPAP    Current Outpatient Prescriptions  Medication Sig Dispense Refill  . albuterol (PROAIR HFA) 108 (90 BASE) MCG/ACT inhaler Inhale 2 puffs into the lungs every 6 (six) hours as needed for wheezing or shortness of breath. For shortness of breath  1 Inhaler  11  . aspirin 81 MG tablet Take 1 tablet (81 mg total) by mouth daily.  30 tablet    . atorvastatin (LIPITOR) 40 MG tablet Take 1 tablet (40 mg total) by mouth daily.  30 tablet  11  . cetirizine (ZYRTEC) 10 MG tablet Take 1 tablet (10 mg total) by mouth daily.  90 tablet  3  . Fluticasone-Salmeterol (ADVAIR DISKUS) 250-50 MCG/DOSE AEPB Inhale 1 puff into the lungs every 12 hours.  60 each  11  . hydrochlorothiazide (HYDRODIURIL) 25 MG tablet Take 1 tablet (25 mg total) by mouth daily.  90 tablet  3  . losartan (COZAAR) 100 MG tablet Take 1 tablet (100 mg total)  by mouth daily.  90 tablet  3  . mometasone (NASONEX) 50 MCG/ACT nasal spray Place 2 sprays into the nose daily.  17 g  3  . pantoprazole (PROTONIX) 40 MG tablet Take 1 tablet (40 mg total) by mouth daily.  90 tablet  3   No current facility-administered medications for this visit.   Family History  Problem Relation Age of Onset  . Heart disease Father     Died 3 years ago due to CHF and refused pacemaker.  . Cancer Mother     breast cancer passed away in 07/19/2009.   History   Social History  . Marital Status: Married    Spouse Name: Garret Teale    Number of Children: N/A  . Years of Education: 10   Occupational History  . Environmental services   .  [employer]   Social History Main Topics  . Smoking status: Former Smoker -- 0.20 packs/day for 3 years    Types: Cigarettes    Quit date: 03/21/1993  . Smokeless tobacco: Never Used     Comment: 1ppd x 2 years  . Alcohol Use: No  . Drug Use: No  . Sexual Activity: Yes    Partners: Female   Other Topics Concern  . 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         Review of Systems: Review of Systems  Constitutional: Negative for fever and chills.  HENT: Positive for congestion and sore throat. Negative for ear pain.   Respiratory: Positive for cough (occasional ). Negative for shortness of breath and wheezing.   Cardiovascular: Negative for chest pain.  Gastrointestinal: Negative for nausea, vomiting, abdominal pain, diarrhea and constipation.  Genitourinary: Negative for dysuria, urgency and frequency.  Musculoskeletal: Negative for myalgias.  Neurological: Positive for headaches. Negative for dizziness.    Objective:  Physical Exam: Filed Vitals:   10/28/13 0835  BP: 146/97  Pulse: 80  Temp: 97.2 F (36.2 C)  TempSrc: Oral  Height: 5' 7.5" (1.715 m)  Weight: 193 lb 8 oz (87.771 kg)  SpO2: 98%    Physical Exam  Constitutional: He is oriented to person, place,  and time. He appears well-developed and well-nourished. No distress.  HENT:  Head: Normocephalic and atraumatic.  Right Ear: External ear normal.  Left Ear: External ear normal.  Nose: Nose normal.  Mouth/Throat: Oropharynx is clear and moist. No oropharyngeal exudate.  Eyes: EOM are normal.  Neck: Normal range of motion. Neck supple.  Cardiovascular: Normal rate, regular rhythm and normal heart sounds.   Pulmonary/Chest: Effort normal and breath sounds normal. No respiratory distress. He has no wheezes. He has no rales.  Abdominal: Soft. Bowel sounds are normal. He exhibits no distension. There is no tenderness. There is no rebound and no guarding.  Musculoskeletal: Normal range of motion. He exhibits no edema and no tenderness.  Neurological: He is alert and oriented to person, place, and time.  Skin: Skin is warm and dry. No rash noted. He is not diaphoretic. No erythema. No pallor.  Psychiatric: He has a normal mood and affect. His behavior is normal. Judgment and thought content normal.    Assessment & Plan:   Please see problem list for problem-based assessment and plan

## 2013-11-08 ENCOUNTER — Encounter: Payer: Self-pay | Admitting: Gastroenterology

## 2013-12-27 ENCOUNTER — Encounter: Payer: Self-pay | Admitting: Internal Medicine

## 2013-12-27 ENCOUNTER — Ambulatory Visit (AMBULATORY_SURGERY_CENTER): Payer: Self-pay | Admitting: *Deleted

## 2013-12-27 ENCOUNTER — Ambulatory Visit (INDEPENDENT_AMBULATORY_CARE_PROVIDER_SITE_OTHER): Payer: 59 | Admitting: Internal Medicine

## 2013-12-27 VITALS — BP 150/83 | HR 88 | Temp 98.2°F | Wt 193.5 lb

## 2013-12-27 VITALS — Ht 67.5 in | Wt 192.8 lb

## 2013-12-27 DIAGNOSIS — I10 Essential (primary) hypertension: Secondary | ICD-10-CM

## 2013-12-27 DIAGNOSIS — E119 Type 2 diabetes mellitus without complications: Secondary | ICD-10-CM

## 2013-12-27 DIAGNOSIS — J329 Chronic sinusitis, unspecified: Secondary | ICD-10-CM

## 2013-12-27 DIAGNOSIS — Z1211 Encounter for screening for malignant neoplasm of colon: Secondary | ICD-10-CM

## 2013-12-27 DIAGNOSIS — D649 Anemia, unspecified: Secondary | ICD-10-CM

## 2013-12-27 DIAGNOSIS — E785 Hyperlipidemia, unspecified: Secondary | ICD-10-CM

## 2013-12-27 DIAGNOSIS — Z0000 Encounter for general adult medical examination without abnormal findings: Secondary | ICD-10-CM

## 2013-12-27 LAB — CBC WITH DIFFERENTIAL/PLATELET
BASOS ABS: 0.1 10*3/uL (ref 0.0–0.1)
Basophils Relative: 1 % (ref 0–1)
Eosinophils Absolute: 0.2 10*3/uL (ref 0.0–0.7)
Eosinophils Relative: 3 % (ref 0–5)
HCT: 41.3 % (ref 39.0–52.0)
Hemoglobin: 14.1 g/dL (ref 13.0–17.0)
Lymphocytes Relative: 42 % (ref 12–46)
Lymphs Abs: 2.4 10*3/uL (ref 0.7–4.0)
MCH: 29.1 pg (ref 26.0–34.0)
MCHC: 34.1 g/dL (ref 30.0–36.0)
MCV: 85.3 fL (ref 78.0–100.0)
Monocytes Absolute: 0.5 10*3/uL (ref 0.1–1.0)
Monocytes Relative: 9 % (ref 3–12)
NEUTROS ABS: 2.6 10*3/uL (ref 1.7–7.7)
NEUTROS PCT: 45 % (ref 43–77)
PLATELETS: 340 10*3/uL (ref 150–400)
RBC: 4.84 MIL/uL (ref 4.22–5.81)
RDW: 14.3 % (ref 11.5–15.5)
WBC: 5.7 10*3/uL (ref 4.0–10.5)

## 2013-12-27 MED ORDER — MONTELUKAST SODIUM 10 MG PO TABS: 10.0000 mg | ORAL_TABLET | Freq: Every day | ORAL | Status: DC

## 2013-12-27 MED ORDER — PREDNISONE 20 MG PO TABS: 20.0000 mg | ORAL_TABLET | Freq: Every day | ORAL | Status: DC

## 2013-12-27 MED ORDER — PREDNISONE 20 MG PO TABS: 40.0000 mg | ORAL_TABLET | Freq: Every day | ORAL | Status: DC

## 2013-12-27 MED ORDER — MOVIPREP 100 G PO SOLR: 1.0000 | Freq: Once | ORAL | Status: DC

## 2013-12-27 NOTE — Patient Instructions (Signed)
-  Take Singulair 10 mg daily for allergies, keep taking zyrtec and nasonex -Take prednisone 40 mg daily for 5 days  -Will get a chest xray today and check your bloodwork -Will refer you to allergy and immunology -Will see you back in 1 month    General Instructions:   Please bring your medicines with you each time you come to clinic.  Medicines may include prescription medications, over-the-counter medications, herbal remedies, eye drops, vitamins, or other pills.   Progress Toward Treatment Goals:  Treatment Goal 04/22/2013  Blood pressure at goal    Self Care Goals & Plans:  Self Care Goal 10/28/2013  Manage my medications take my medicines as prescribed; bring my medications to every visit; refill my medications on time; follow the sick day instructions if I am sick  Monitor my health keep track of my blood pressure; keep track of my weight  Eat healthy foods eat more vegetables; eat fruit for snacks and desserts; eat baked foods instead of fried foods; eat foods that are low in salt; eat smaller portions  Be physically active find an activity I enjoy    No flowsheet data found.   Care Management & Community Referrals:  Referral 04/22/2013  Referrals made for care management support diabetes educator

## 2013-12-27 NOTE — Progress Notes (Signed)
Patient ID: Collin Lopez, male   DOB: [birth year], 47 y.o.   MRN: [id number]    Subjective:   Patient ID: Collin Lopez male   DOB: 10-06-1965 48 y.o.   MRN: [id number]  HPI: Collin Lopez is a 48 y.o. very pleasant man with past medical history of pulmonary sarcoidosis, hypertension, hyperlipidemia, allergic rhinitis, GERD, and OSA who presents for routine follow-up visit.    He was seen 2 months ago for URI symptoms after traveling to the [place] and reports still having nasal congestion without rhinorrhea, PND with throat irritation, watery/red eyes, mild bilateral maxillary sinus tenderness, dry cough, and occasional wheezing.  He reports augmentin that was prescribed at last visit helped for a few days. He denies fever, chills, sneezing, purulent nasal discharge, tooth pain, orbital swelling, vision change, headache, or dyspnea. He describes his ears as itchy without discharge or pain. He was seen at the dentist recently (1.5 weeks ago) with no need for dental work and given periogard for gingivitis (?) which irritated his throat. He has a pulmonologist that he sees for pulmonary sarcoidosis but has never seen allergy/ immunology in the past or had allergy skin testing. He reports eating coconut in the [place] that may have triggered his allergies in addition to the weather change.   He has been salt water gargling and compliant with taking zyrtec daily, nasonex, and advair. He has not had to use albuterol recently. He has never been on singular in the past.   He is compliant with taking losartan and HCTZ for hypertension. He reports mild headache but denies blurry vision, chest pain, LE edema, or lightheadedness.   He is compliant with taking lipitor for hyperlipidemia. He denies muscle cramping or pain.   He has history of pre-diabetes and reports he is trying to eat healthy and exercise. His weight has been stable since last visit.   He is scheduled for colonoscopy in a  few weeks. He would like to have prostate screening with PSA testing. He has already received an influenza vaccination for his job.      Past Medical History  Diagnosis Date  . Sarcoidosis   . Esophageal reflux   . Unspecified essential hypertension   . Tear of medial cartilage or meniscus of knee, current   . Congenital pes planus   . Abnormality of gait   . Pain in joint, lower leg   . OSA on CPAP   . Sleep apnea     uses cpap  . Allergy     seasonal  . Asthma    Current Outpatient Prescriptions  Medication Sig Dispense Refill  . albuterol (PROAIR HFA) 108 (90 BASE) MCG/ACT inhaler Inhale 2 puffs into the lungs every 6 (six) hours as needed for wheezing or shortness of breath. For shortness of breath  1 Inhaler  11  . aspirin 81 MG tablet Take 1 tablet (81 mg total) by mouth daily.  30 tablet    . atorvastatin (LIPITOR) 40 MG tablet Take 1 tablet (40 mg total) by mouth daily.  30 tablet  11  . cetirizine (ZYRTEC) 10 MG tablet Take 1 tablet (10 mg total) by mouth daily.  90 tablet  3  . Fluticasone-Salmeterol (ADVAIR DISKUS) 250-50 MCG/DOSE AEPB Inhale 1 puff into the lungs every 12 hours.  60 each  11  . hydrochlorothiazide (HYDRODIURIL) 25 MG tablet Take 1 tablet (25 mg total) by mouth daily.  90 tablet  3  . losartan (COZAAR) 100 MG tablet  Take 1 tablet (100 mg total) by mouth daily.  90 tablet  3  . mometasone (NASONEX) 50 MCG/ACT nasal spray Place 2 sprays into the nose daily.  17 g  3  . montelukast (SINGULAIR) 10 MG tablet Take 1 tablet (10 mg total) by mouth at bedtime.  30 tablet  0  . MOVIPREP 100 G SOLR Take 1 kit (200 g total) by mouth once. moviprep as directed. No substitutions  1 kit  0  . pantoprazole (PROTONIX) 40 MG tablet Take 1 tablet (40 mg total) by mouth daily.  90 tablet  3  . predniSONE (DELTASONE) 20 MG tablet Take 2 tablets (40 mg total) by mouth daily with breakfast.  10 tablet  0   No current facility-administered medications for this visit.    Family History  Problem Relation Age of Onset  . Heart disease Father     Died 3 years ago due to CHF and refused pacemaker.  . Cancer Mother     breast cancer passed away in 2009/06/12.  . Colon cancer Neg Hx   . Rectal cancer Neg Hx   . Stomach cancer Neg Hx    History   Social History  . Marital Status: Married    Spouse Name: Collin Lopez    Number of Children: N/A  . Years of Education: 10   Occupational History  . Environmental services   .  [employer]   Social History Main Topics  . Smoking status: Former Smoker -- 0.20 packs/day for 3 years    Types: Cigarettes    Quit date: 03/21/1993  . Smokeless tobacco: Never Used     Comment: 1ppd x 2 years  . Alcohol Use: No  . Drug Use: No  . Sexual Activity: Yes    Partners: Female   Other Topics Concern  . 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         Review of Systems: Review of Systems  Constitutional: Negative for fever, chills and malaise/fatigue.  HENT: Positive for congestion and sore throat (irritation ). Negative for ear discharge and ear pain.        PND  Eyes: Negative for blurred vision.  Respiratory: Positive for cough (dry) and wheezing. Negative for sputum production and shortness of breath.   Cardiovascular: Negative for chest pain and leg swelling.  Gastrointestinal: Negative for nausea, vomiting, abdominal pain, diarrhea, constipation and blood in stool.  Genitourinary: Negative for dysuria, urgency, frequency and hematuria.  Musculoskeletal: Positive for joint pain (right knee). Negative for myalgias.  Neurological: Positive for headaches (mild). Negative for dizziness.    Objective:  Physical Exam: Filed Vitals:   12/27/13 1612  BP: 150/83  Pulse: 88  Temp: 98.2 F (36.8 C)  TempSrc: Oral  Weight: 193 lb 8 oz (87.771 kg)  SpO2: 97%    Physical Exam  Constitutional: He is oriented to person, place, and time. He appears well-developed and  well-nourished. No distress.  HENT:  Head: Normocephalic and atraumatic.  Right Ear: External ear normal.  Left Ear: External ear normal.  Mouth/Throat: Oropharynx is clear and moist. No oropharyngeal exudate.  Mild b/l maxillary tenderness. Allergic shiners. Nasal voice.  Eyes: Conjunctivae and EOM are normal. Pupils are equal, round, and reactive to light. Right eye exhibits no discharge. Left eye exhibits no discharge. No scleral icterus.  Neck: Normal range of motion. Neck supple.  Cardiovascular: Normal rate, regular rhythm and normal heart sounds.  Pulmonary/Chest: Effort normal and breath sounds normal. No respiratory distress. He has no wheezes. He has no rales.  Abdominal: Soft. Bowel sounds are normal. He exhibits no distension. There is no tenderness. There is no rebound and no guarding.  Musculoskeletal: Normal range of motion. He exhibits tenderness (right patella). He exhibits no edema.  Neurological: He is alert and oriented to person, place, and time.  Skin: Skin is warm and dry. No rash noted. He is not diaphoretic. No erythema. No pallor.  Psychiatric: He has a normal mood and affect. His behavior is normal. Judgment and thought content normal.    Ahyssessment & Plan:   Please see problem list for problem-based assessment and plan

## 2013-12-27 NOTE — Progress Notes (Signed)
No egg or soy allergy. ewm  No diet pills. ewm  Pt states with his knee surgery his mask from anesthesia was removed too soon and he had fluid come in lungs but no other issues with sedation. ewm  No home 02 use but uses cpap. ewm  emmi video per a willett rn to pt's e mail. ewm

## 2013-12-28 ENCOUNTER — Ambulatory Visit (HOSPITAL_COMMUNITY)
Admission: RE | Admit: 2013-12-28 | Discharge: 2013-12-28 | Disposition: A | Discharge status: Home / Self Care | Payer: 59 | Source: Intra-hospital | Attending: Interventional Radiology | Admitting: Interventional Radiology

## 2013-12-28 ENCOUNTER — Ambulatory Visit (HOSPITAL_COMMUNITY)
Admission: RE | Admit: 2013-12-28 | Discharge: 2013-12-28 | Disposition: A | Discharge status: Home / Self Care | Payer: 59 | Source: Ambulatory Visit | Attending: Internal Medicine | Admitting: Internal Medicine

## 2013-12-28 DIAGNOSIS — R062 Wheezing: Secondary | ICD-10-CM | POA: Insufficient documentation

## 2013-12-28 DIAGNOSIS — Z0000 Encounter for general adult medical examination without abnormal findings: Secondary | ICD-10-CM | POA: Insufficient documentation

## 2013-12-28 DIAGNOSIS — D869 Sarcoidosis, unspecified: Secondary | ICD-10-CM

## 2013-12-28 DIAGNOSIS — R0602 Shortness of breath: Secondary | ICD-10-CM | POA: Diagnosis not present

## 2013-12-28 DIAGNOSIS — Z862 Personal history of diseases of the blood and blood-forming organs and certain disorders involving the immune mechanism: Secondary | ICD-10-CM | POA: Insufficient documentation

## 2013-12-28 LAB — COMPLETE METABOLIC PANEL WITH GFR
ALT: 35 U/L (ref 0–53)
AST: 26 U/L (ref 0–37)
Albumin: 4.5 g/dL (ref 3.5–5.2)
Alkaline Phosphatase: 100 U/L (ref 39–117)
BUN: 17 mg/dL (ref 6–23)
CALCIUM: 9.7 mg/dL (ref 8.4–10.5)
CHLORIDE: 102 meq/L (ref 96–112)
CO2: 29 mEq/L (ref 19–32)
Creat: 1.1 mg/dL (ref 0.50–1.35)
GFR, EST NON AFRICAN AMERICAN: 78 mL/min
Glucose, Bld: 92 mg/dL (ref 70–99)
Potassium: 3.8 mEq/L (ref 3.5–5.3)
Sodium: 141 mEq/L (ref 135–145)
Total Bilirubin: 0.4 mg/dL (ref 0.2–1.2)
Total Protein: 7.5 g/dL (ref 6.0–8.3)

## 2013-12-28 LAB — LIPID PANEL
CHOLESTEROL: 134 mg/dL (ref 0–200)
HDL: 28 mg/dL — ABNORMAL LOW (ref >39)
LDL Cholesterol: 67 mg/dL (ref 0–99)
TRIGLYCERIDES: 197 mg/dL — AB (ref <150)
Total CHOL/HDL Ratio: 4.8 Ratio
VLDL: 39 mg/dL (ref 0–40)

## 2013-12-28 LAB — ANEMIA PANEL
%SAT: 14 % — ABNORMAL LOW (ref 20–55)
ABS Retic: 67.8 10*3/uL (ref 19.0–186.0)
Ferritin: 125 ng/mL (ref 22–322)
Iron: 46 ug/dL (ref 42–165)
RBC.: 4.84 MIL/uL (ref 4.22–5.81)
RETIC CT PCT: 1.4 % (ref 0.4–2.3)
TIBC: 337 ug/dL (ref 215–435)
UIBC: 291 ug/dL (ref 125–400)
Vitamin B-12: 1225 pg/mL — ABNORMAL HIGH (ref 211–911)

## 2013-12-28 LAB — HEMOGLOBIN A1C
Hgb A1c MFr Bld: 6.7 % — ABNORMAL HIGH (ref <5.7)
MEAN PLASMA GLUCOSE: 146 mg/dL — AB (ref <117)

## 2013-12-28 LAB — PSA: PSA: 0.48 ng/mL (ref <=4.00)

## 2013-12-28 MED ORDER — METFORMIN HCL 500 MG PO TABS: 500.0000 mg | ORAL_TABLET | Freq: Two times a day (BID) | ORAL | Status: DC

## 2013-12-28 NOTE — Assessment & Plan Note (Addendum)
Assessment: Pt with history of seasonal allergic rhinitis with flare of symptoms after recent foreign travel 2 months ago due to probable weather change who presents with persistent symptoms and possible chronic sinusitis.   Plan:  -Obtain CBC w/diff ---> no leukocytosis  -Obtain IgE level  -Obtain chest xray in setting of chronic cough, recent foreign travel, and h/o of pulmonary sarcoidosis  -Prescribe prednisone 40 mg daily for 5 days and montelukast 10 mg daily  -Continue nasonex 50 mcg nasal spray 2 sprays daily, nasal saline irrigation, and cetrizine 10 mg daily  -Continue daily advair and albuterol inhaler 2 puffs PRN 6 hrs acute bronchospasm  -Pt to return in 1 month and if no improvement of symptoms after corticosteroid therapy will obtain CT sinuses to assess for chronic sinusitis  -Refer to allergy and immunology for allergy skin testing to identify and avoid allergen exposure

## 2013-12-28 NOTE — Assessment & Plan Note (Addendum)
Assessment: Pt with history of pre-diabetes with last A1c 6.3 on 05/30/13 who presents with newly diagnosed Type II DM with A1c of 6.7.   Plan:  -A1c 6.7 not at goal <7, start metformin 500 mg BID and continue lifestyle modification  -BP 150/83 not at goal <140/90, continue losartan 100 mg daily and HCTZ 25 mg daily, consider starting amlodipine 5 mg daily at next visit if continues to be uncontrolled  -LDL 67 at goal <100, continue atorvastatin 40 mg daily  -Obtain diabetic eye exam and foot exam at next visit -Obtain urine microalbumin at next visit to assess for effectiveness of ARB therapy  -BMI 29.84 at goal <30, continue lifestyle modification  -Continue aspirin 81 mg daily for primary CVD prevention

## 2013-12-28 NOTE — Assessment & Plan Note (Signed)
Assessment: Pt with moderate to well-controlled hypertension compliant with two-class (diuretic, ARB) anti-hypertensive therapy who presents with blood pressure of 150/83.   Plan:  -BP 150/83 not at goal <140/90 (pt reports it has been normal at home)  -Continue HCTZ 25 mg daily and losartan 100 mg daily  -Consider adding amlodipine 5 mg daily at next visit if continues to be uncontrolled -Obtain CMP ---> normal

## 2013-12-28 NOTE — Assessment & Plan Note (Signed)
Assessment: Pt with last lipid panel on 02/22/13 with hypercholesteremia and hypertriglyceridemia that has improved with high-intensity statin therapy with 10-yr ASCVD risk of 22.2% and lifetime risk of 69% with recommendations to continue moderate to high intensity statin therapy.  Plan:  -Obtain lipid panel --->  improved total and LDL cholesterol, mildly worsened HDL and TG -Continue atorvastatin 40 mg daily  -Obtain CMP --> normal liver function -Continue to monitor for myalgias

## 2013-12-28 NOTE — Assessment & Plan Note (Addendum)
-  Pt scheduled for screening colonoscopy on 01/10/14. -Obtain PSA level for screening for prostate cancer due to pt request ---> normal  -Obtain annual screening HIV Ab -Pt has received annual annual influenza vaccination

## 2013-12-28 NOTE — Assessment & Plan Note (Signed)
Assessment: Pt with history of normocytic anemia since 2011 with no anemia panel in the past and scheduled for screening colonoscopy who presents with no active bleeding or hemodynamic instability.    Plan: -Obtain CBC w/diff ---> anemia resolved -Obtain anemia panel ----> ferritin normal at 125 with low iron saturation 14% -Pt scheduled for screening colonoscopy later this month

## 2013-12-30 LAB — IGE: IgE (Immunoglobulin E), Serum: 42 kU/L (ref <115)

## 2013-12-30 NOTE — Progress Notes (Signed)
Internal Medicine Clinic Attending  Case discussed with Dr. Rabbani soon after the resident saw the patient.  We reviewed the resident's history and exam and pertinent patient test results.  I agree with the assessment, diagnosis, and plan of care documented in the resident's note.  

## 2014-01-01 LAB — HIV ANTIBODY (ROUTINE TESTING W REFLEX): HIV: NONREACTIVE

## 2014-01-10 ENCOUNTER — Ambulatory Visit (AMBULATORY_SURGERY_CENTER): Payer: 59 | Admitting: Gastroenterology

## 2014-01-10 ENCOUNTER — Encounter: Payer: Self-pay | Admitting: Gastroenterology

## 2014-01-10 VITALS — BP 124/90 | HR 76 | Temp 96.7°F | Resp 15 | Ht 67.5 in | Wt 192.0 lb

## 2014-01-10 DIAGNOSIS — D123 Benign neoplasm of transverse colon: Secondary | ICD-10-CM

## 2014-01-10 DIAGNOSIS — K5730 Diverticulosis of large intestine without perforation or abscess without bleeding: Secondary | ICD-10-CM

## 2014-01-10 DIAGNOSIS — D128 Benign neoplasm of rectum: Secondary | ICD-10-CM

## 2014-01-10 DIAGNOSIS — Z1211 Encounter for screening for malignant neoplasm of colon: Secondary | ICD-10-CM

## 2014-01-10 MED ORDER — SODIUM CHLORIDE 0.9 % IV SOLN: 500.0000 mL | INTRAVENOUS | Status: DC

## 2014-01-10 NOTE — Progress Notes (Signed)
A/ox3 pleased with MAC, report to April RN 

## 2014-01-10 NOTE — Op Note (Signed)
[facility]  [address] [place], [id number]   COLONOSCOPY PROCEDURE REPORT  PATIENT: Collin Lopez, Collin Lopez  MR#: [id number] BIRTHDATE: 08/27/1965 , 48  yrs. old GENDER: male ENDOSCOPIST: Milus Banister, MD REFERRED BY: Juluis Mire, MD PROCEDURE DATE:  01/10/2014 PROCEDURE:   Colonoscopy with snare polypectomy First Screening Colonoscopy - Avg.  risk and is 50 yrs.  old or older Yes.  Prior Negative Screening - Now for repeat screening. N/A  History of Adenoma - Now for follow-up colonoscopy & has been > or = to 3 yrs.  N/A  Polyps Removed Today? Yes. ASA CLASS:   Class II INDICATIONS:average risk for colon cancer. MEDICATIONS: Monitored anesthesia care and Propofol 250 mg IV  DESCRIPTION OF PROCEDURE:   After the risks benefits and alternatives of the procedure were thoroughly explained, informed consent was obtained.  The digital rectal exam revealed no abnormalities of the rectum.   The LB [id number]  endoscope was introduced through the anus and advanced to the cecum, which was identified by both the appendix and ileocecal valve. No adverse events experienced.   The quality of the prep was excellent.  The instrument was then slowly withdrawn as the colon was fully examined.  COLON FINDINGS: Two sessile polyps ranging between 3-35mm in size were found in the rectum and transverse colon.  Polypectomies were performed with a cold snare.  The resection was complete, the polyp tissue was partially retrieved and sent to histology (one polyp was retrieved).   There was mild diverticulosis noted in the left colon.   The examination was otherwise normal.  Retroflexed views revealed no abnormalities. The time to cecum=2 minutes 14 seconds. Withdrawal time=11 minutes 36 seconds.  The scope was withdrawn and the procedure completed. COMPLICATIONS: There were no immediate complications.  ENDOSCOPIC IMPRESSION: 1.   Two sessile polyps ranging between 3-66mm in  size were found in the rectum and transverse colon; polypectomies were performed with a cold snare 2.   Mild diverticulosis was noted in the left colon 3.   The examination was otherwise normal  RECOMMENDATIONS: If the polyp(s) removed today are proven to be adenomatous (pre-cancerous) polyps, you will need a repeat colonoscopy in 5 years.  Otherwise you should continue to follow colorectal cancer screening guidelines for "routine risk" patients with colonoscopy in 10 years.  You will receive a letter within 1-2 weeks with the results of your biopsy as well as final recommendations.  Please call my office if you have not received a letter after 3 weeks.  eSigned:  Milus Banister, MD 01/10/2014 9:31 AM

## 2014-01-10 NOTE — Progress Notes (Signed)
Called to room to assist during endoscopic procedure.  Patient ID and intended procedure confirmed with present staff. Received instructions for my participation in the procedure from the performing physician.  

## 2014-01-10 NOTE — Patient Instructions (Signed)
YOU HAD AN ENDOSCOPIC PROCEDURE TODAY AT THE [facility]: Refer to the procedure report that was given to you for any specific questions about what was found during the examination.  If the procedure report does not answer your questions, please call your gastroenterologist to clarify.  If you requested that your care partner not be given the details of your procedure findings, then the procedure report has been included in a sealed envelope for you to review at your convenience later.  YOU SHOULD EXPECT: Some feelings of bloating in the abdomen. Passage of more gas than usual.  Walking can help get rid of the air that was put into your GI tract during the procedure and reduce the bloating. If you had a lower endoscopy (such as a colonoscopy or flexible sigmoidoscopy) you may notice spotting of blood in your stool or on the toilet paper. If you underwent a bowel prep for your procedure, then you may not have a normal bowel movement for a few days.  DIET: Your first meal following the procedure should be a light meal and then it is ok to progress to your normal diet.  A half-sandwich or bowl of soup is an example of a good first meal.  Heavy or fried foods are harder to digest and may make you feel nauseous or bloated.  Likewise meals heavy in dairy and vegetables can cause extra gas to form and this can also increase the bloating.  Drink plenty of fluids but you should avoid alcoholic beverages for 24 hours.  ACTIVITY: Your care partner should take you home directly after the procedure.  You should plan to take it easy, moving slowly for the rest of the day.  You can resume normal activity the day after the procedure however you should NOT DRIVE or use heavy machinery for 24 hours (because of the sedation medicines used during the test).    SYMPTOMS TO REPORT IMMEDIATELY: A gastroenterologist can be reached at any hour.  During normal business hours, 8:30 AM to 5:00 PM Monday through Friday,  call [phone].  After hours and on weekends, please call the GI answering service at [phone] who will take a message and have the physician on call contact you.   Following lower endoscopy (colonoscopy or flexible sigmoidoscopy):  Excessive amounts of blood in the stool  Significant tenderness or worsening of abdominal pains  Swelling of the abdomen that is new, acute  Fever of 100F or higher  FOLLOW UP: If any biopsies were taken you will be contacted by phone or by letter within the next 1-3 weeks.  Call your gastroenterologist if you have not heard about the biopsies in 3 weeks.  Our staff will call the home number listed on your records the next business day following your procedure to check on you and address any questions or concerns that you may have at that time regarding the information given to you following your procedure. This is a courtesy call and so if there is no answer at the home number and we have not heard from you through the emergency physician on call, we will assume that you have returned to your regular daily activities without incident.  SIGNATURES/CONFIDENTIALITY: You and/or your care partner have signed paperwork which will be entered into your electronic medical record.  These signatures attest to the fact that that the information above on your After Visit Summary has been reviewed and is understood.  Full responsibility of the confidentiality of this   this discharge information lies with you and/or your care-partner.  Polyps, diverticulosis, high fiber diet-handouts given  Repeat colonoscopy will be determined by pathology.  

## 2014-01-13 ENCOUNTER — Telehealth: Payer: Self-pay | Admitting: *Deleted

## 2014-01-13 NOTE — Telephone Encounter (Signed)
  Follow up Call-  Call back number 01/10/2014  Post procedure Call Back phone  # [phone]  Permission to leave phone message Yes     Patient questions:  Do you have a fever, pain , or abdominal swelling? No. Pain Score  0 *  Have you tolerated food without any problems? Yes.    Have you been able to return to your normal activities? Yes.    Do you have any questions about your discharge instructions: Diet   No. Medications  No. Follow up visit  No.  Do you have questions or concerns about your Care? No.  Actions: * If pain score is 4 or above: No action needed, pain <4.

## 2014-01-28 ENCOUNTER — Encounter: Payer: Self-pay | Admitting: Gastroenterology

## 2014-02-11 ENCOUNTER — Other Ambulatory Visit: Payer: Self-pay | Admitting: Internal Medicine

## 2014-03-19 ENCOUNTER — Encounter: Payer: Self-pay | Admitting: Internal Medicine

## 2014-03-19 ENCOUNTER — Other Ambulatory Visit: Payer: 59

## 2014-03-19 ENCOUNTER — Ambulatory Visit (INDEPENDENT_AMBULATORY_CARE_PROVIDER_SITE_OTHER): Payer: 59 | Admitting: Internal Medicine

## 2014-03-19 ENCOUNTER — Telehealth: Payer: Self-pay | Admitting: Critical Care Medicine

## 2014-03-19 VITALS — BP 132/76 | HR 81 | Ht 67.5 in | Wt 189.0 lb

## 2014-03-19 DIAGNOSIS — J309 Allergic rhinitis, unspecified: Secondary | ICD-10-CM

## 2014-03-19 DIAGNOSIS — J324 Chronic pansinusitis: Secondary | ICD-10-CM

## 2014-03-19 NOTE — Patient Instructions (Addendum)
Order- lab- Allergy profile, Food IgE profile   Dx Allergic rhinitis  If results are negative and you feel well, we may not need to do anything more.

## 2014-03-19 NOTE — Telephone Encounter (Signed)
noted 

## 2014-03-19 NOTE — Assessment & Plan Note (Addendum)
He is describing an acute and now resolved episode which began like a head cold and sinusitis while visiting the [place] in July 2015. This sounds more like an acute sinusitis which was slow to clear. He does not have a history pointing strongly to an atopic mechanism and in particular there has only been the single event. We can screen broadly with allergy profiles. If there is enough evidence then skin testing can be considered. Otherwise, unless he has more problems, I don't think any more needs to be done. I don't have a way to assess for an allergen unique to the [place] unless he can recognize the exposure. Plan-allergy profile, food allergy profile. He will continue to follow with Dr. Joya Gaskins for his sarcoid and other problems as appropriate.

## 2014-03-19 NOTE — Progress Notes (Signed)
03/19/14- 89 yoM former smoker pt referred by Dr. Naaman Plummer for allergy consult.  Pt had severe allergy symptoms (sinus congestion, sore throat, cough) after visiting the [place].  pt sees PW for sarcoidosis.   IgE 42 12/27/13 His wife is from the [place]. His first trip there in 2007/07/01 was uneventful. On a return visit July, 2015, he felt he caught a cold from travel, multiple airplanes, and steady exposure in and out of air conditioning. Wide variety of exposures to people and foods. Progessive shore throat, head congestion began to improve, then relapsed with sore throat, nasal congestion lasting 2 weeks. He now feels well.  Dog contact made eyes itch and waltr No skin or food intolerances. Asthma controlled on Advair 1x/ daily. No seasonal nsal symptoms. Sister has some allergic rhinitis. OSA/ CPAP per Dr Joya Gaskins.  Prior to Admission medications   Medication Sig Start Date End Date Taking? Authorizing Provider  albuterol (PROAIR HFA) 108 (90 BASE) MCG/ACT inhaler Inhale 2 puffs into the lungs every 6 (six) hours as needed for wheezing or shortness of breath. For shortness of breath 10/21/13  Yes Juluis Mire, MD  aspirin 81 MG tablet Take 1 tablet (81 mg total) by mouth daily. 07/27/12  Yes [name], MD  atorvastatin (LIPITOR) 40 MG tablet Take 1 tablet (40 mg total) by mouth daily. 02/25/13 03/19/14 Yes Na Li, MD  cetirizine (ZYRTEC) 10 MG tablet Take 1 tablet (10 mg total) by mouth daily. 10/21/13  Yes Marjan Rabbani, MD  Fluticasone-Salmeterol (ADVAIR DISKUS) 250-50 MCG/DOSE AEPB Inhale 1 puff into the lungs every 12 hours. 02/22/13  Yes Na Li, MD  hydrochlorothiazide (HYDRODIURIL) 25 MG tablet Take 1 tablet (25 mg total) by mouth daily. 10/21/13  Yes Juluis Mire, MD  losartan (COZAAR) 100 MG tablet Take 1 tablet (100 mg total) by mouth daily. 10/21/13 10/21/14 Yes Juluis Mire, MD  metFORMIN (GLUCOPHAGE) 500 MG tablet Take 1 tablet (500 mg total) by mouth 2 (two) times daily with a meal.  12/28/13  Yes Marjan Rabbani, MD  mometasone (NASONEX) 50 MCG/ACT nasal spray Place 2 sprays into the nose daily. 10/21/13  Yes Marjan Rabbani, MD  montelukast (SINGULAIR) 10 MG tablet TAKE 1 TABLET BY MOUTH ONCE DAILY AT BEDTIME 02/11/14  Yes Marjan Rabbani, MD  pantoprazole (PROTONIX) 40 MG tablet Take 1 tablet (40 mg total) by mouth daily. 10/21/13 10/21/14 Yes Juluis Mire, MD   Past Medical History  Diagnosis Date  . Sarcoidosis   . Esophageal reflux   . Unspecified essential hypertension   . Tear of medial cartilage or meniscus of knee, current   . Congenital pes planus   . Abnormality of gait   . Pain in joint, lower leg   . OSA on CPAP   . Sleep apnea     uses cpap  . Allergy     seasonal  . Asthma    Past Surgical History  Procedure Laterality Date  . Knee surgery      left knee repair for medical meniscus tear   Family History  Problem Relation Age of Onset  . Heart disease Father     Died 3 years ago due to CHF and refused pacemaker.  . Cancer Mother     breast cancer passed away in 06-30-2009.  . Colon cancer Neg Hx   . Rectal cancer Neg Hx   . Stomach cancer Neg Hx    History   Social History  . Marital Status: Married    Spouse Name: Leda Gauze  [place]    Number of Children: N/A  . Years of Education: 10   Occupational History  . Environmental services   .  [employer]   Social History Main Topics  . Smoking status: Former Smoker -- 0.20 packs/day for 3 years    Types: Cigarettes    Quit date: 03/21/1993  . Smokeless tobacco: Never Used     Comment: 1ppd x 2 years  . Alcohol Use: No  . Drug Use: No  . Sexual Activity:    Partners: Female   Other Topics Concern  . 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         ROS-see HPI Constitutional:   No-   weight loss, night sweats, fevers, chills, fatigue, lassitude. HEENT:   No-  headaches, difficulty swallowing, tooth/dental problems, sore throat,       No-   sneezing, itching, ear ache, nasal congestion, post nasal drip,  CV:  No-   chest pain, orthopnea, PND, swelling in lower extremities, anasarca,                                  dizziness, palpitations Resp: No-   shortness of breath with exertion or at rest.              No-   productive cough,  No non-productive cough,  No- coughing up of blood.              No-   change in color of mucus.  No- wheezing.   Skin: No-   rash or lesions. GI:  No-   heartburn, indigestion, abdominal pain, nausea, vomiting, diarrhea,                 change in bowel habits, loss of appetite GU: No-   dysuria, change in color of urine, no urgency or frequency.  No- flank pain. MS:  No-   joint pain or swelling.  No- decreased range of motion.  No- back pain. Neuro-     nothing unusual Psych:  No- change in mood or affect. No depression or anxiety.  No memory loss.  OBJ- Physical Exam General- Alert, Oriented, Affect-appropriate, Distress- none acute Skin- rash-none, lesions- none, excoriation- none Lymphadenopathy- none Head- atraumatic            Eyes- Gross vision intact, PERRLA, conjunctivae and secretions clear            Ears- Hearing, canals-normal            Nose- Clear, no-Septal dev, mucus, polyps, erosion, perforation             Throat- Mallampati IV , mucosa clear , drainage- none, tonsils- atrophic Neck- flexible , trachea midline, no stridor , thyroid nl, carotid no bruit Chest - symmetrical excursion , unlabored           Heart/CV- RRR , no murmur , no gallop  , no rub, nl s1 s2                           - JVD- none , edema- none, stasis changes- none, varices- none           Lung- clear to P&A, wheeze- none, cough- none , dullness-none, rub- none           Chest wall-  Abd- tender-no, distended-no, bowel sounds-present, HSM- no Br/ Gen/ Rectal- Not done, not indicated Extrem- cyanosis- none, clubbing, none, atrophy- none, strength- nl Neuro- grossly intact to observation

## 2014-03-19 NOTE — Telephone Encounter (Signed)
FMLA paperwork was faxed into [facility] TODAY  And this will be due back by 04/01/14.  Wanted to let [facility] know.  Will forward to  [place] and [facility].

## 2014-03-20 LAB — ALLERGY FULL PROFILE
Allergen, D pternoyssinus,d7: <0.1 kU/L
Allergen,Goose feathers, e70: <0.1 kU/L
Alternaria Alternata: <0.1 kU/L
BERMUDA GRASS: 0.1 kU/L — AB
Bahia Grass: 0.66 kU/L — ABNORMAL HIGH
Box Elder IgE: <0.1 kU/L
Cat Dander: <0.1 kU/L
Common Ragweed: <0.1 kU/L
Curvularia lunata: <0.1 kU/L
Dog Dander: <0.1 kU/L
Elm IgE: <0.1 kU/L
Fescue: 0.97 kU/L — ABNORMAL HIGH
G005 RYE, PERENNIAL: 1.02 kU/L — AB
G009 Red Top: 0.93 kU/L — ABNORMAL HIGH
Goldenrod: <0.1 kU/L
Helminthosporium halodes: <0.1 kU/L
House Dust Hollister: <0.1 kU/L
Lamb's Quarters: <0.1 kU/L
Stemphylium Botryosum: <0.1 kU/L
Sycamore Tree: <0.1 kU/L
TIMOTHY GRASS: 0.82 kU/L — AB

## 2014-03-20 LAB — ALLERGEN FOOD PROFILE SPECIFIC IGE
Apple: <0.1 kU/L
Chicken IgE: <0.1 kU/L
Corn: <0.1 kU/L
Egg White IgE: <0.1 kU/L
Fish Cod: <0.1 kU/L
IgE (Immunoglobulin E), Serum: 36 kU/L (ref <115)
Shrimp IgE: <0.1 kU/L
Soybean IgE: <0.1 kU/L
Tomato IgE: <0.1 kU/L
Tuna IgE: <0.1 kU/L

## 2014-03-20 NOTE — Telephone Encounter (Signed)
I have not seen these papers yet.  Where were they faxed to?  Also, FMLA paperwork should be handled by/taken to [employer].   ATC pt to discuss above.  Received msg that "the VM box is full and cannot accept new msgs at this time.  Please try your call again later."

## 2014-03-24 NOTE — Telephone Encounter (Signed)
LMTCB for [employer] to see if they received this. [place] Bing, CMA

## 2014-03-24 NOTE — Telephone Encounter (Signed)
I spoke with Sharyn Lull in [employer] and she states that they have not received any paperwork. We also checked with receptionist and she has not received anything either. She recommended to give them the fax # of [phone]. I called the pt to advise and he provided me with the # to [employer] [phone] option 1 ext# [phone], Edwena Felty.   [email or web address]. [place] Bing, CMA

## 2014-03-25 ENCOUNTER — Encounter: Payer: Self-pay | Admitting: *Deleted

## 2014-03-25 NOTE — Telephone Encounter (Signed)
lmomtcb for [employer]

## 2014-03-26 NOTE — Telephone Encounter (Signed)
Called [employer] and spoke with Lattie Haw who reported pt's FMLA paperwork was faxed x2 to [phone] She will fax this again to triage and once more the number above I requested the paperwork be sent to triage to verify receipt and will then communicate with [employer] and send forms to that dept per protocol FMLA paperwork is due by 1.19.16  Will hold in triage to await fax

## 2014-03-27 NOTE — Telephone Encounter (Signed)
Spoke with Collin Lopez at [facility] and asked that she refax FMLA forms to triage fax.  Forms received and sent down to [employer].  Pt informed that forms were received and sent to [employer] to be filled out.

## 2014-04-09 ENCOUNTER — Telehealth: Payer: Self-pay | Admitting: Critical Care Medicine

## 2014-04-09 NOTE — Telephone Encounter (Signed)
Received [employer] FMLA forms from [employer].   Forms signed by Dr. Joya Gaskins and sent to [facility].

## 2014-04-16 ENCOUNTER — Encounter: Payer: Self-pay | Admitting: Critical Care Medicine

## 2014-04-16 ENCOUNTER — Ambulatory Visit (INDEPENDENT_AMBULATORY_CARE_PROVIDER_SITE_OTHER): Payer: 59 | Admitting: Critical Care Medicine

## 2014-04-16 VITALS — BP 132/76 | HR 81 | Temp 98.6°F | Ht 67.5 in | Wt 189.8 lb

## 2014-04-16 DIAGNOSIS — D869 Sarcoidosis, unspecified: Secondary | ICD-10-CM

## 2014-04-16 DIAGNOSIS — J302 Other seasonal allergic rhinitis: Secondary | ICD-10-CM

## 2014-04-16 MED ORDER — ALBUTEROL SULFATE HFA 108 (90 BASE) MCG/ACT IN AERS: 2.0000 | INHALATION_SPRAY | Freq: Four times a day (QID) | RESPIRATORY_TRACT | Status: AC | PRN

## 2014-04-16 MED ORDER — MOMETASONE FUROATE 50 MCG/ACT NA SUSP: 2.0000 | Freq: Every day | NASAL | Status: DC

## 2014-04-16 MED ORDER — FLUTICASONE-SALMETEROL 250-50 MCG/DOSE IN AEPB: INHALATION_SPRAY | RESPIRATORY_TRACT | Status: DC

## 2014-04-16 NOTE — Patient Instructions (Signed)
No changes in medications Return 1 year or as needed

## 2014-04-16 NOTE — Progress Notes (Signed)
Subjective:    Patient ID: Collin Lopez, male    DOB: 15-Sep-1965, 49 y.o.   MRN: [id number]  HPI  49 y.o.  05/02/2012 Last seen 2012.  Pt notes more dyspnea and cough. Pt felt change in weather was a trigger. Chest is heavy. Sinus are stopped up. Notes some pndrip.  Mucus is green . Notes some green out of nose. Notes more dyspnea.  Notes some headache.  There is increased sinus drainage and sinus pressure noted. There is no fever chills or sweats noted  04/16/2014 Chief Complaint  Patient presents with  . Yearly Follow up    Slight nonprod cough.  SOB unchanged.  No wheezing, chest tightness, or CP.  Pt notes a slight dry cough.  Dyspnea the same.  No real sinus issues. No real wheeze.  Pt denies any significant sore throat, nasal congestion or excess secretions, fever, chills, sweats, unintended weight loss, pleurtic or exertional chest pain, orthopnea PND, or leg swelling Pt denies any increase in rescue therapy over baseline, denies waking up needing it or having any early am or nocturnal exacerbations of coughing/wheezing/or dyspnea. Pt also denies any obvious fluctuation in symptoms with  weather or environmental change or other alleviating or aggravating factors     Review of Systems Constitutional:   No  weight loss, night sweats,  Fevers, chills, fatigue, lassitude. HEENT:   Notes  headaches,  Difficulty swallowing,  Tooth/dental problems,++  Sore throat,                No sneezing, itching, ear ache, +++nasal congestion,+++ post nasal drip,   CV:  No chest pain,  Orthopnea, PND, swelling in lower extremities, anasarca, dizziness, palpitations  GI  No heartburn, indigestion, abdominal pain, nausea, vomiting, diarrhea, change in bowel habits, loss of appetite  Resp: Notes  shortness of breath with exertion and  at rest.  No excess mucus, notes  productive cough,  No non-productive cough,  No coughing up of blood.  No change in color of mucus.  No wheezing.  No chest wall  deformity  Skin: no rash or lesions.  GU: no dysuria, change in color of urine, no urgency or frequency.  No flank pain.  MS:  No joint pain or swelling.  No decreased range of motion.  No back pain.  Psych:  No change in mood or affect. No depression or anxiety.  No memory loss.     Objective:   Physical Exam Filed Vitals:   04/16/14 0952  BP: 132/76  Pulse: 81  Temp: 98.6 F (37 C)  TempSrc: Oral  Height: 5' 7.5" (1.715 m)  Weight: 189 lb 12.8 oz (86.093 kg)  SpO2: 100%    Gen: Pleasant, well-nourished, in no distress,  normal affect  ENT: No lesions,  mouth clear,  oropharynx clear, no postnasal drip  Neck: No JVD, no TMG, no carotid bruits  Lungs: No use of accessory muscles, no dullness to percussion, clear Cardiovascular: RRR, heart sounds normal, no murmur or gallops, no peripheral edema  Abdomen: soft and NT, no HSM,  BS normal  Musculoskeletal: No deformities, no cyanosis or clubbing  Neuro: alert, non focal  Skin: Warm, no lesions or rashes      Assessment & Plan:   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 albuterol (PROAIR HFA) 108 (90 BASE) MCG/ACT inhaler Inhale 2 puffs into the lungs every 6 (six) hours as needed for wheezing or shortness of breath. For shortness of breath  . aspirin 81 MG tablet Take 1 tablet (81 mg total) by mouth daily.  Marland Kitchen atorvastatin (LIPITOR) 40 MG tablet Take 1 tablet (40 mg total) by mouth daily.  . cetirizine (ZYRTEC) 10 MG tablet Take 1 tablet (10 mg total) by mouth daily.  . Fluticasone-Salmeterol (ADVAIR DISKUS) 250-50 MCG/DOSE AEPB Inhale 1 puff into the lungs every 12 hours.  . hydrochlorothiazide (HYDRODIURIL) 25 MG tablet Take 1 tablet (25 mg  total) by mouth daily.  Marland Kitchen losartan (COZAAR) 100 MG tablet Take 1 tablet (100 mg total) by mouth daily.  . metFORMIN (GLUCOPHAGE) 500 MG tablet Take 1 tablet (500 mg total) by mouth 2 (two) times daily with a meal.  . mometasone (NASONEX) 50 MCG/ACT nasal spray Place 2 sprays into the nose daily.  . montelukast (SINGULAIR) 10 MG tablet TAKE 1 TABLET BY MOUTH ONCE DAILY AT BEDTIME  . pantoprazole (PROTONIX) 40 MG tablet Take 1 tablet (40 mg total) by mouth daily.  . [DISCONTINUED] albuterol (PROAIR HFA) 108 (90 BASE) MCG/ACT inhaler Inhale 2 puffs into the lungs every 6 (six) hours as needed for wheezing or shortness of breath. For shortness of breath  . [DISCONTINUED] Fluticasone-Salmeterol (ADVAIR DISKUS) 250-50 MCG/DOSE AEPB Inhale 1 puff into the lungs every 12 hours.  . [DISCONTINUED] mometasone (NASONEX) 50 MCG/ACT nasal spray Place 2 sprays into the nose daily.

## 2014-04-16 NOTE — Assessment & Plan Note (Signed)
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

## 2014-04-18 ENCOUNTER — Ambulatory Visit (INDEPENDENT_AMBULATORY_CARE_PROVIDER_SITE_OTHER): Payer: 59 | Admitting: Internal Medicine

## 2014-04-18 ENCOUNTER — Ambulatory Visit (INDEPENDENT_AMBULATORY_CARE_PROVIDER_SITE_OTHER): Payer: 59 | Admitting: Family Medicine

## 2014-04-18 ENCOUNTER — Encounter: Payer: Self-pay | Admitting: Family Medicine

## 2014-04-18 VITALS — HR 88 | Ht 67.0 in | Wt 189.0 lb

## 2014-04-18 VITALS — BP 148/94 | HR 85 | Temp 98.3°F

## 2014-04-18 DIAGNOSIS — E119 Type 2 diabetes mellitus without complications: Secondary | ICD-10-CM

## 2014-04-18 DIAGNOSIS — M25519 Pain in unspecified shoulder: Secondary | ICD-10-CM | POA: Insufficient documentation

## 2014-04-18 DIAGNOSIS — I10 Essential (primary) hypertension: Secondary | ICD-10-CM

## 2014-04-18 DIAGNOSIS — M25512 Pain in left shoulder: Secondary | ICD-10-CM | POA: Insufficient documentation

## 2014-04-18 LAB — BASIC METABOLIC PANEL WITH GFR
BUN: 13 mg/dL (ref 6–23)
CHLORIDE: 105 meq/L (ref 96–112)
CO2: 28 mEq/L (ref 19–32)
Calcium: 9.9 mg/dL (ref 8.4–10.5)
Creat: 1.04 mg/dL (ref 0.50–1.35)
GFR, EST NON AFRICAN AMERICAN: 83 mL/min
Glucose, Bld: 97 mg/dL (ref 70–99)
Potassium: 4.4 mEq/L (ref 3.5–5.3)
Sodium: 140 mEq/L (ref 135–145)

## 2014-04-18 LAB — GLUCOSE, CAPILLARY: GLUCOSE-CAPILLARY: 100 mg/dL — AB (ref 70–99)

## 2014-04-18 LAB — HM DIABETES EYE EXAM

## 2014-04-18 LAB — POCT GLYCOSYLATED HEMOGLOBIN (HGB A1C): HEMOGLOBIN A1C: 6.3

## 2014-04-18 MED ORDER — METHYLPREDNISOLONE ACETATE 40 MG/ML IJ SUSP
40.0000 mg | Freq: Once | INTRAMUSCULAR | Status: AC
  Administered 2014-04-18: 40 mg via INTRA_ARTICULAR

## 2014-04-18 MED ORDER — HYDROCHLOROTHIAZIDE 25 MG PO TABS: 25.0000 mg | ORAL_TABLET | Freq: Every day | ORAL | Status: DC

## 2014-04-18 MED ORDER — ATORVASTATIN CALCIUM 40 MG PO TABS: 40.0000 mg | ORAL_TABLET | Freq: Every day | ORAL | Status: DC

## 2014-04-18 NOTE — Progress Notes (Signed)
Patient ID: Collin Lopez, male   DOB: October 26, 1965, 49 y.o.   MRN: [id number]  Collin Lopez - 49 y.o. male MRN [id number]  Date of birth: 1965-05-21    SUBJECTIVE:     Left shoulder pain and stiffness for the last 3-4 weeks. 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 Has been having a lot of curtains. Starting to have pain with overhead motion and now pain with trying to put his seatbelt on. His wife started massaging it some the last week and that is helped. About 3 years ago he had similar issues and had a corticosteroid injection which was very beneficial. ROS:     No other unusual joint pains or stiffness. No fever, sweats, chills, unusual weight change. Has seen no rash or erythema of the left shoulder. No numbness or tingling in the left upper extremity.  PERTINENT  PMH / PSH FH / / SH:  Past Medical, Surgical, Social, and Family History Reviewed & Updated in the EMR.  Pertinent findings include:  Pulmonary sarcoidosis. Diabetes mellitus. History of subacromial bursitis with left shoulder ultrasound negative for rotator cuff tear in 2013.  OBJECTIVE: Pulse 88  Ht 5\' 7"  (1.702 m)  Wt 189 lb (85.73 kg)  BMI 29.59 kg/m2  Physical Exam:  Vital signs are reviewed. GEN.: Well-developed male no acute distress SHOULDERS: Symmetrical. He has full range of motion in all planes the rotator cuff on the right. The left shoulder has some decrease in range of motion because of pain in forward flexion in the last 10. Abduction pain begins at 160 and he is able to get it to 170. Internal rotation is painful on the left. The before meals joint is nontender to palpation. The bicep muscle is without defect and nontender. The bicep tendon is nontender. NEURO: Intact sensation soft touch bilaterally in the upper extremities and hands. VASCULAR: Radial pulses 2+ bilaterally equal SKIN: Skin surrounding the left shoulder is without lesion. He has a well-healed very  old scar on his left upper arm.  INJECTION: Patient was given informed consent, signed copy in the chart. Appropriate time out was taken. Area prepped and draped in usual sterile fashion. 1 cc of methylprednisolone 40 mg/ml plus  4 cc of 1% lidocaine without epinephrine was injected into the left subacromial bursa using a(n) posterior approach. The patient tolerated the procedure well. There were no complications. Post procedure instructions were given.   ASSESSMENT & PLAN:  See problem based charting & AVS for pt instructions.

## 2014-04-18 NOTE — Assessment & Plan Note (Signed)
Occurrence of his subacromial bursitis. He had been working out at [employer] doing a lot of pull-up some pushups. I think he strengthened those muscles but not rotator cuff muscles. Today we decided to do a subacromial injection to getting back on the right track. Gave him handout again for rotator cuff exercises emphasized the importance of those. Gave him Thera-Band. He'll follow-up when necessary.

## 2014-04-18 NOTE — Progress Notes (Signed)
Internal Medicine Clinic Attending  Case discussed with Dr. Rabbani at the time of the visit.  We reviewed the resident's history and exam and pertinent patient test results.  I agree with the assessment, diagnosis, and plan of care documented in the resident's note.  

## 2014-04-18 NOTE — Patient Instructions (Addendum)
-  Great job on improving your A1c it is 6.3 (from 6.7 before)! Keep taking your metformin  -Will check your eyes, feet,  bloodwork and urine today  -I refilled your HCTZ and lipitor -Will see you back in 3 months since you are doing so well!  General Instructions:   Thank you for bringing your medicines today. This helps [place] keep you safe from mistakes.   Progress Toward Treatment Goals:  Treatment Goal 04/22/2013  Blood pressure at goal    Self Care Goals & Plans:  Self Care Goal 10/28/2013  Manage my medications take my medicines as prescribed; bring my medications to every visit; refill my medications on time; follow the sick day instructions if I am sick  Monitor my health keep track of my blood pressure; keep track of my weight  Eat healthy foods eat more vegetables; eat fruit for snacks and desserts; eat baked foods instead of fried foods; eat foods that are low in salt; eat smaller portions  Be physically active find an activity I enjoy    No flowsheet data found.   Care Management & Community Referrals:  Referral 04/22/2013  Referrals made for care management support diabetes educator        -

## 2014-04-18 NOTE — Progress Notes (Signed)
Patient ID: Collin Lopez, male   DOB: 1965/12/04, 49 y.o.   MRN: [id number]    Subjective:   Patient ID: Collin Lopez male   DOB: [birth year] 49 y.o.   MRN: [id number]  HPI: [name] is a 49 y.o. very pleasant man with past medical history of pulmonary sarcoidosis, hypertension, hyperlipidemia, non-insulin dependent Type 2 DM, allergic rhinitis, GERD, and OSA who presents for follow-up of diabetes.    His last A1c was 6.7 on 12/27/13. He is compliant with taking metformin 500 mg BID with no side effects. He does not monitor his blood glucose. He reports polyuria and polydipsia but denies symptomatic hypoglycemia, blurry vision, polyphagia, neuropathy, or foot injury/ulcer. He has not had a recent eye exam. He tries to follow a healthy diet and exercise. He has lost 4 lbs since last visit in October.   He is compliant with taking losartan and HCTZ for hypertension. He denies headache, chest pain, LE edema, or lightheadedness.    Past Medical History  Diagnosis Date  . Sarcoidosis   . Esophageal reflux   . Unspecified essential hypertension   . Tear of medial cartilage or meniscus of knee, current   . Congenital pes planus   . Abnormality of gait   . Pain in joint, lower leg   . OSA on CPAP   . Sleep apnea     uses cpap  . Allergy     seasonal  . Asthma    Current Outpatient Prescriptions  Medication Sig Dispense Refill  . albuterol (PROAIR HFA) 108 (90 BASE) MCG/ACT inhaler Inhale 2 puffs into the lungs every 6 (six) hours as needed for wheezing or shortness of breath. For shortness of breath 1 Inhaler 11  . aspirin 81 MG tablet Take 1 tablet (81 mg total) by mouth daily. 30 tablet   . atorvastatin (LIPITOR) 40 MG tablet Take 1 tablet (40 mg total) by mouth daily. 30 tablet 11  . cetirizine (ZYRTEC) 10 MG tablet Take 1 tablet (10 mg total) by mouth daily. 90 tablet 3  . Fluticasone-Salmeterol (ADVAIR DISKUS) 250-50 MCG/DOSE AEPB Inhale 1 puff into the lungs every  12 hours. 60 each 11  . hydrochlorothiazide (HYDRODIURIL) 25 MG tablet Take 1 tablet (25 mg total) by mouth daily. 90 tablet 3  . losartan (COZAAR) 100 MG tablet Take 1 tablet (100 mg total) by mouth daily. 90 tablet 3  . metFORMIN (GLUCOPHAGE) 500 MG tablet Take 1 tablet (500 mg total) by mouth 2 (two) times daily with a meal. 60 tablet 2  . mometasone (NASONEX) 50 MCG/ACT nasal spray Place 2 sprays into the nose daily. 17 g 6  . montelukast (SINGULAIR) 10 MG tablet TAKE 1 TABLET BY MOUTH ONCE DAILY AT BEDTIME 30 tablet 5  . pantoprazole (PROTONIX) 40 MG tablet Take 1 tablet (40 mg total) by mouth daily. 90 tablet 3   No current facility-administered medications for this visit.   Family History  Problem Relation Age of Onset  . Heart disease Father     Died 3 years ago due to CHF and refused pacemaker.  . Cancer Mother     breast cancer passed away in 07/03/09.  . Colon cancer Neg Hx   . Rectal cancer Neg Hx   . Stomach cancer Neg Hx    History   Social History  . Marital Status: Married    Spouse Name: Mate Alegria    Number of Children: N/A  . Years of Education: 55  Occupational History  . Environmental services   .  [employer]   Social History Main Topics  . Smoking status: Former Smoker -- 0.20 packs/day for 3 years    Types: Cigarettes    Quit date: 03/21/1993  . Smokeless tobacco: Never Used     Comment: 1ppd x 2 years  . Alcohol Use: No  . Drug Use: No  . Sexual Activity:    Partners: Female   Other Topics Concern  . 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         Review of Systems: Review of Systems  Constitutional: Positive for weight loss (intentional). Negative for fever and chills.  HENT: Negative for congestion and sore throat.   Eyes: Negative for blurred vision.  Respiratory: Negative for cough, shortness of breath and wheezing.   Cardiovascular: Negative for chest pain and leg swelling.    Gastrointestinal: Positive for heartburn (controlled on PPI). Negative for nausea, vomiting, abdominal pain, diarrhea, constipation and blood in stool.  Genitourinary: Negative for dysuria, urgency, frequency and hematuria.       Polyuria  Musculoskeletal: Positive for joint pain (left shoulder). Negative for myalgias.  Neurological: Negative for dizziness, sensory change and headaches.  Endo/Heme/Allergies: Positive for environmental allergies and polydipsia.     Objective:  Physical Exam: Filed Vitals:   04/18/14 1321  BP: 148/94  Pulse: 85  Temp: 98.3 F (36.8 C)  TempSrc: Oral   Physical Exam  Constitutional: He is oriented to person, place, and time. He appears well-developed and well-nourished. No distress.  HENT:  Head: Normocephalic and atraumatic.  Right Ear: External ear normal.  Left Ear: External ear normal.  Nose: Nose normal.  Mouth/Throat: Oropharynx is clear and moist. No oropharyngeal exudate.  Eyes: Conjunctivae and EOM are normal. Pupils are equal, round, and reactive to light. Right eye exhibits no discharge. Left eye exhibits no discharge. No scleral icterus.  Neck: Normal range of motion. Neck supple.  Cardiovascular: Normal rate, regular rhythm and normal heart sounds.   No murmur heard. Pulmonary/Chest: Effort normal and breath sounds normal. No respiratory distress. He has no wheezes. He has no rales.  Abdominal: Soft. Bowel sounds are normal. He exhibits no distension. There is no tenderness. There is no rebound and no guarding.  Musculoskeletal: Normal range of motion. He exhibits no edema or tenderness.  Neurological: He is alert and oriented to person, place, and time.  Skin: Skin is warm and dry. No rash noted. He is not diaphoretic. No erythema. No pallor.  Psychiatric: He has a normal mood and affect. His behavior is normal. Judgment and thought content normal.    Assessment & Plan:   Please see problem list for problem-based assessment and  plan

## 2014-04-19 LAB — MICROALBUMIN / CREATININE URINE RATIO
Creatinine, Urine: 142.4 mg/dL
Microalb Creat Ratio: 90.6 mg/g — ABNORMAL HIGH (ref 0.0–30.0)
Microalb, Ur: 12.9 mg/dL — ABNORMAL HIGH (ref <2.0)

## 2014-04-19 NOTE — Assessment & Plan Note (Addendum)
Assessment: Pt with moderate to well-controlled hypertension compliant with two-class (diuretic, ARB) anti-hypertensive therapy who presents with blood pressure of 148/94.   Plan:  -BP 148/94 not at goal <140/90  -Continue losartan 100 mg daily and refillHCTZ 25 mg daily  -Consider adding amlodipine 5 mg daily at next visit if continues to be uncontrolled -Obtain BMP ---> normal

## 2014-04-19 NOTE — Assessment & Plan Note (Addendum)
Assessment: Pt with last A1c of 6.7 complaint with oral hypoglycemic therapy with no symptomatic hypoglycemia who presents with CBG of 100.     Plan:  -A1c 6.3  at goal <7, continue metformin 500 mg BID   -BP 148/94 not at goal <140/90, continue losartan 100 mg daily and refill HCTZ 25 mg daily  -LDL 67 at goal <100, refill atorvastatin 40 mg daily  -Perform annual eye exam and foot exam -Obtain urine microalbumin ---> 90.6 mg, continue losartan 100 mg daily   -BMI 29.59 at goal <30, continue lifestyle modification

## 2014-04-22 ENCOUNTER — Encounter: Payer: Self-pay | Admitting: *Deleted

## 2014-05-09 ENCOUNTER — Telehealth: Payer: Self-pay | Admitting: *Deleted

## 2014-05-09 ENCOUNTER — Other Ambulatory Visit: Payer: Self-pay | Admitting: Internal Medicine

## 2014-05-09 DIAGNOSIS — N529 Male erectile dysfunction, unspecified: Secondary | ICD-10-CM | POA: Insufficient documentation

## 2014-05-09 MED ORDER — TADALAFIL 10 MG PO TABS: 10.0000 mg | ORAL_TABLET | ORAL | Status: DC | PRN

## 2014-05-09 NOTE — Telephone Encounter (Signed)
Pt states he needs a refill on  Cialis and  Andro gel. Pt uses [facility]. Pt states he has gotten these meds from clinic. Hilda Blades Mathan Darroch RN 05/09/14 10:30AM

## 2014-05-09 NOTE — Telephone Encounter (Signed)
I refilled cialis for erectile dysfunction but not the andro gel since he does not have documented testosterone deficiency and there is no testosterone level in our system. We will need to check his level at his next visit.   Thanks!  Dr. Naaman Plummer

## 2014-05-14 ENCOUNTER — Other Ambulatory Visit: Payer: Self-pay | Admitting: Internal Medicine

## 2014-05-16 NOTE — Telephone Encounter (Signed)
Pt is already aware 

## 2014-07-11 ENCOUNTER — Encounter: Payer: Self-pay | Admitting: Internal Medicine

## 2014-07-11 ENCOUNTER — Ambulatory Visit (INDEPENDENT_AMBULATORY_CARE_PROVIDER_SITE_OTHER): Payer: 59 | Admitting: Internal Medicine

## 2014-07-11 VITALS — BP 139/82 | HR 72 | Temp 98.1°F | Wt 179.4 lb

## 2014-07-11 DIAGNOSIS — I10 Essential (primary) hypertension: Secondary | ICD-10-CM | POA: Diagnosis not present

## 2014-07-11 DIAGNOSIS — E119 Type 2 diabetes mellitus without complications: Secondary | ICD-10-CM | POA: Diagnosis not present

## 2014-07-11 DIAGNOSIS — J302 Other seasonal allergic rhinitis: Secondary | ICD-10-CM

## 2014-07-11 DIAGNOSIS — J309 Allergic rhinitis, unspecified: Secondary | ICD-10-CM

## 2014-07-11 LAB — GLUCOSE, CAPILLARY: GLUCOSE-CAPILLARY: 94 mg/dL (ref 70–99)

## 2014-07-11 LAB — POCT GLYCOSYLATED HEMOGLOBIN (HGB A1C): HEMOGLOBIN A1C: 6.3

## 2014-07-11 MED ORDER — MONTELUKAST SODIUM 10 MG PO TABS: 10.0000 mg | ORAL_TABLET | Freq: Every day | ORAL | Status: DC

## 2014-07-11 MED ORDER — METFORMIN HCL 500 MG PO TABS: 500.0000 mg | ORAL_TABLET | Freq: Every day | ORAL | Status: DC

## 2014-07-11 NOTE — Patient Instructions (Signed)
-  Your A1c is great at 6.3, decrease your metformin from 500 mg twice a day to daily  -I refilled your singulair for allergies -Please come back in 3 months for recheck of your diabetes and keep up the great work!!  General Instructions:   Thank you for bringing your medicines today. This helps [place] keep you safe from mistakes.   Progress Toward Treatment Goals:  Treatment Goal 04/22/2013  Blood pressure at goal    Self Care Goals & Plans:  Self Care Goal 10/28/2013  Manage my medications take my medicines as prescribed; bring my medications to every visit; refill my medications on time; follow the sick day instructions if I am sick  Monitor my health keep track of my blood pressure; keep track of my weight  Eat healthy foods eat more vegetables; eat fruit for snacks and desserts; eat baked foods instead of fried foods; eat foods that are low in salt; eat smaller portions  Be physically active find an activity I enjoy    No flowsheet data found.   Care Management & Community Referrals:  Referral 04/22/2013  Referrals made for care management support diabetes educator

## 2014-07-11 NOTE — Assessment & Plan Note (Addendum)
Assessment: Pt with last A1c of 6.3 on 04/19/15 compliant with oral hypoglycemic therapy with no symptomatic hypoglycemia who presents with CBG of 94.   Plan:  -A1c 6.3at goal <7, decrease metformin 500 mg BID to daily -BP 139/82  at goal <140/90, continue losartan 100 mg daily and HCTZ 25 mg daily  -LDL 67 at goal <100, continue atorvastatin 40 mg daily  -Last annual eye exam on 04/18/14 was normal  -Last annual foot exam was on 04/18/14 -Last urine microalbumin on 04/18/14 with 90.6 mg of proteinuria, continue losartan 100 mg daily  -BMI 28.09 not at goal <25, continue lifestyle modification

## 2014-07-11 NOTE — Assessment & Plan Note (Signed)
Assessment: Pt with moderate to well-controlled hypertension compliant with two-class (diuretic, ARB) anti-hypertensive therapy who presents with blood pressure of 139/82.   Plan:  -BP 139/82 at goal <140/90  -Continue losartan 100 mg daily and HCTZ 25 mg daily  -Last BMP on 04/18/14 was normal, repeat at next visit

## 2014-07-11 NOTE — Progress Notes (Signed)
Patient ID: Collin Lopez, male   DOB: 17-Nov-1965, 49 y.o.   MRN: [id number]    Subjective:   Patient ID: Collin Lopez male   DOB: [birth year] 49 y.o.   MRN: [id number]  HPI: [name] is a 49 y.o.  very pleasant man with past medical history of pulmonary sarcoidosis, hypertension, hyperlipidemia, non-insulin dependent Type 2 DM, allergic rhinitis, GERD, and OSA who presents for follow-up of diabetes.   His last A1c was 6.3 on 04/18/14. He is compliant with taking metformin 500 mg BID with no side effects. He does not monitor his blood glucose. He has chronic polyuria (also on diuretic) and polydipsia but denies symptomatic hypoglycemia, blurry vision, polyphagia, neuropathy, or foot injury/ulcer. His eye exam on 04/18/14 did not show evidence of retinopathy. He follows a healthy diet and exercises regularly.  He has lost 10 lb since last visit in January.    He is compliant with taking losartan and HCTZ for hypertension. He denies headache, chest pain, LE edema, or lightheadedness.   His allergic rhinitis is well-controlled with zyrtec, nasonex, and singulair.  He has occasional sneezing and watery eyes that are triggered by the weather. His allergy panel on 03/19/14 revealed grass pollen allergy.     Past Medical History  Diagnosis Date  . Sarcoidosis   . Esophageal reflux   . Unspecified essential hypertension   . Tear of medial cartilage or meniscus of knee, current   . Congenital pes planus   . Abnormality of gait   . Pain in joint, lower leg   . OSA on CPAP   . Sleep apnea     uses cpap  . Allergy     seasonal  . Asthma    Current Outpatient Prescriptions  Medication Sig Dispense Refill  . albuterol (PROAIR HFA) 108 (90 BASE) MCG/ACT inhaler Inhale 2 puffs into the lungs every 6 (six) hours as needed for wheezing or shortness of breath. For shortness of breath 1 Inhaler 11  . aspirin 81 MG tablet Take 1 tablet (81 mg total) by mouth daily. 30 tablet   .  atorvastatin (LIPITOR) 40 MG tablet Take 1 tablet (40 mg total) by mouth daily. 90 tablet 3  . cetirizine (ZYRTEC) 10 MG tablet Take 1 tablet (10 mg total) by mouth daily. 90 tablet 3  . Fluticasone-Salmeterol (ADVAIR DISKUS) 250-50 MCG/DOSE AEPB Inhale 1 puff into the lungs every 12 hours. 60 each 11  . hydrochlorothiazide (HYDRODIURIL) 25 MG tablet Take 1 tablet (25 mg total) by mouth daily. 90 tablet 3  . losartan (COZAAR) 100 MG tablet Take 1 tablet (100 mg total) by mouth daily. 90 tablet 3  . metFORMIN (GLUCOPHAGE) 500 MG tablet TAKE 1 TABLET BY MOUTH 2 TIMES DAILY WITH A MEAL. 60 tablet 2  . mometasone (NASONEX) 50 MCG/ACT nasal spray Place 2 sprays into the nose daily. 17 g 6  . montelukast (SINGULAIR) 10 MG tablet TAKE 1 TABLET BY MOUTH ONCE DAILY AT BEDTIME 30 tablet 5  . pantoprazole (PROTONIX) 40 MG tablet Take 1 tablet (40 mg total) by mouth daily. 90 tablet 3  . tadalafil (CIALIS) 10 MG tablet Take 1 tablet (10 mg total) by mouth as needed for erectile dysfunction. 20 tablet 1   No current facility-administered medications for this visit.   Family History  Problem Relation Age of Onset  . Heart disease Father     Died 3 years ago due to CHF and refused pacemaker.  . Cancer Mother  breast cancer passed away in 2011.  . Colon cancer Neg Hx   . Rectal cancer Neg Hx   . Stomach cancer Neg Hx    History   Social History  . Marital Status: Married    Spouse Name: Massai Hankerson  . Number of Children: N/A  . Years of Education: 10   Occupational History  . Environmental services   .  [place]   Social History Main Topics  . Smoking status: Former Smoker -- 0.20 packs/day for 3 years    Types: Cigarettes    Quit date: 03/21/1993  . Smokeless tobacco: Never Used     Comment: 1ppd x 2 years  . Alcohol Use: No  . Drug Use: No  . Sexual Activity:    Partners: Female   Other Topics Concern  . 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         Review of Systems: Review of Systems  Constitutional: Positive for weight loss (intentional ) and malaise/fatigue. Negative for fever and chills.  HENT: Negative for congestion and sore throat.        Sneezing  Eyes: Negative for blurred vision.       Watery eyes  Respiratory: Negative for cough, shortness of breath and wheezing.   Cardiovascular: Negative for chest pain and leg swelling.  Gastrointestinal: Negative for heartburn (controlled on PPI), nausea, vomiting, abdominal pain, diarrhea, constipation and blood in stool.  Genitourinary: Negative for dysuria, urgency, frequency and hematuria.  Musculoskeletal: Positive for joint pain (chronic left shoulder pain). Negative for myalgias.  Neurological: Negative for dizziness and headaches.  Endo/Heme/Allergies: Positive for environmental allergies and polydipsia.    Objective:  Physical Exam: Filed Vitals:   07/11/14 1316  BP: 139/82  Pulse: 72  Temp: 98.1 F (36.7 C)  TempSrc: Oral  Weight: 179 lb 6.4 oz (81.375 kg)  SpO2: 99%    Physical Exam  Constitutional: He is oriented to person, place, and time. He appears well-developed and well-nourished. No distress.  HENT:  Head: Normocephalic and atraumatic.  Right Ear: External ear normal.  Left Ear: External ear normal.  Nose: Nose normal.  Mouth/Throat: Oropharynx is clear and moist. No oropharyngeal exudate.  Eyes: Conjunctivae and EOM are normal. Pupils are equal, round, and reactive to light. Right eye exhibits no discharge. Left eye exhibits no discharge. No scleral icterus.  Neck: Normal range of motion. Neck supple.  Cardiovascular: Normal rate, regular rhythm and normal heart sounds.   Pulmonary/Chest: Effort normal and breath sounds normal. No respiratory distress. He has no wheezes. He has no rales.  Abdominal: Soft. Bowel sounds are normal. He exhibits no distension. There is no tenderness. There is no rebound and no guarding.    Musculoskeletal: Normal range of motion. He exhibits no edema or tenderness.  Neurological: He is alert and oriented to person, place, and time.  Skin: Skin is warm and dry. No rash noted. He is not diaphoretic. No erythema. No pallor.  Psychiatric: He has a normal mood and affect. His behavior is normal. Judgment and thought content normal.    Assessment & Plan:   Please see problem list for problem-based assessment and plan

## 2014-07-11 NOTE — Assessment & Plan Note (Signed)
Assessment: Pt with seasonal allergic rhinitis with last allergy panel on 03/20/15 with grass pollen allergy who presents with well-controlled symptoms on current medical therapy.   Plan:  -Refill montelukast 10 mg daily  -Continue nasonex 2 sprays daily and cetirizine 10 mg daily

## 2014-07-12 NOTE — Progress Notes (Signed)
Internal Medicine Clinic Attending  Case discussed with Dr. Rabbani soon after the resident saw the patient.  We reviewed the resident's history and exam and pertinent patient test results.  I agree with the assessment, diagnosis, and plan of care documented in the resident's note.  

## 2014-08-21 ENCOUNTER — Telehealth: Payer: Self-pay | Admitting: Internal Medicine

## 2014-08-21 NOTE — Telephone Encounter (Signed)
Call to patient to confirm appointment for 08/25/14 at 1:30 vm not set up

## 2014-08-25 ENCOUNTER — Ambulatory Visit: Payer: 59 | Admitting: *Deleted

## 2014-10-03 ENCOUNTER — Encounter: Payer: Self-pay | Admitting: Internal Medicine

## 2014-10-20 ENCOUNTER — Ambulatory Visit (INDEPENDENT_AMBULATORY_CARE_PROVIDER_SITE_OTHER): Payer: 59 | Admitting: Sports Medicine

## 2014-10-20 ENCOUNTER — Encounter: Payer: Self-pay | Admitting: Sports Medicine

## 2014-10-20 VITALS — BP 119/81 | Ht 67.0 in | Wt 175.0 lb

## 2014-10-20 DIAGNOSIS — M25512 Pain in left shoulder: Secondary | ICD-10-CM | POA: Diagnosis not present

## 2014-10-20 MED ORDER — METHYLPREDNISOLONE ACETATE 40 MG/ML IJ SUSP
40.0000 mg | Freq: Once | INTRAMUSCULAR | Status: AC
  Administered 2014-10-20: 40 mg via INTRA_ARTICULAR

## 2014-10-20 NOTE — Progress Notes (Signed)
  LEXIE MORINI - 49 y.o. male MRN [id number]  Date of birth: Jul 17, 1965   DELSHON BLANCHFIELD is a 49 y.o. male who presents today for left shoulder pain.   The pain occurs in the proximal aspect of his left shoulder. It is similar to the pain that he was evaluated for in January in the Sports medicine clinic. He received an injection during that time and his pain was relieved for roughly 6 months. He has been doing rehab exercises during that time and swimming. He was performing breast strokes and that seemed to exacerbate his symptoms. 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 His pain has been getting worse. It wakes him up at night. Pain is intermittent and relieved with ibuprofen.    He has received two steroid injections with the last being in Jan 2016. Ultrasound of left shoulder in 2013 was negative for a Rotator cuff tear.   PMHx - reviewed.  Contributory factors include:  DM2, pulmonary sarcoidosis  PSHx - reviewed.   Medications - metformin, losartan, lipitor, asa.   ROS Per HPI   Exam:  Filed Vitals:   10/20/14 1405  BP: 119/81   Gen: NAD Cardiorespiratory - Normal respiratory effort/rate.   Shoulder: Laterality: left Appearance: symmetric, no obvious deformities  Tenderness: yes, anterior and proximal shoulder. No TTP on AC joint or BT.    Range of Motion: normal active and passive  Maneuvers: Empty can: neg  Resisted Internal rotation: normal  Resisted External rotation:normal  O'Brien's test: neg   Speeds: neg  No painful arc and no drop arm sign. Strength:  Bicep: 5/5 Grip: 5/5  INJECTION: left shoulder  Patient was given informed consent, signed copy in the chart. Appropriate time out was taken. Area prepped and draped in usual sterile fashion. One cc of methylprednisolone 40 mg/ml plus 4 cc of 1% lidocaine without epinephrine was injected into the subacromial bursa using a(n) posterior  approach. The patient tolerated the procedure well. There were no complications. Post procedure instructions were given.  Imaging:  History of subacromial bursitis with left shoulder ultrasound negative for rotator cuff tear in 2013.

## 2014-10-20 NOTE — Assessment & Plan Note (Addendum)
Pain most likely 2/2 subacromial bursitis vs OA vs Rotator cuff pathology.  Symptoms worse with overhead activities and no paresthesia.  No reported injury as source.  He has been doing rehab exercises since January.  - left shoulder x-ray today.  - continue ibuprofen and rehab exercises  - will f/u in one month. Discuss further diagnostic/ treatment options to include MRI or repeat US and possibility for PT

## 2014-10-21 ENCOUNTER — Ambulatory Visit (HOSPITAL_COMMUNITY)
Admission: RE | Admit: 2014-10-21 | Discharge: 2014-10-21 | Disposition: A | Discharge status: Home / Self Care | Payer: 59 | Source: Ambulatory Visit | Attending: Sports Medicine | Admitting: Sports Medicine

## 2014-10-21 DIAGNOSIS — M25512 Pain in left shoulder: Secondary | ICD-10-CM | POA: Insufficient documentation

## 2014-10-24 ENCOUNTER — Ambulatory Visit (INDEPENDENT_AMBULATORY_CARE_PROVIDER_SITE_OTHER): Payer: 59 | Admitting: Internal Medicine

## 2014-10-24 ENCOUNTER — Encounter: Payer: Self-pay | Admitting: Internal Medicine

## 2014-10-24 VITALS — BP 138/89 | HR 75 | Temp 98.2°F | Resp 20 | Ht 65.5 in | Wt 176.8 lb

## 2014-10-24 DIAGNOSIS — E119 Type 2 diabetes mellitus without complications: Secondary | ICD-10-CM | POA: Diagnosis not present

## 2014-10-24 DIAGNOSIS — I10 Essential (primary) hypertension: Secondary | ICD-10-CM

## 2014-10-24 DIAGNOSIS — E559 Vitamin D deficiency, unspecified: Secondary | ICD-10-CM

## 2014-10-24 DIAGNOSIS — E1165 Type 2 diabetes mellitus with hyperglycemia: Secondary | ICD-10-CM

## 2014-10-24 DIAGNOSIS — Z0000 Encounter for general adult medical examination without abnormal findings: Secondary | ICD-10-CM

## 2014-10-24 LAB — GLUCOSE, CAPILLARY: Glucose-Capillary: 82 mg/dL (ref 65–99)

## 2014-10-24 LAB — POCT GLYCOSYLATED HEMOGLOBIN (HGB A1C): Hemoglobin A1C: 6.1

## 2014-10-24 NOTE — Patient Instructions (Signed)
-  Great job on improving your A1c to 6.1, after you run out of the metformin you don't need to take it anymore -Will check your bloodwork today and call you with the results -Glad you are doing better, please come back in 6 month for your diabetes  General Instructions:   Please bring your medicines with you each time you come to clinic.  Medicines may include prescription medications, over-the-counter medications, herbal remedies, eye drops, vitamins, or other pills.   Progress Toward Treatment Goals:  Treatment Goal 04/22/2013  Blood pressure at goal    Self Care Goals & Plans:  Self Care Goal 10/28/2013  Manage my medications take my medicines as prescribed; bring my medications to every visit; refill my medications on time; follow the sick day instructions if I am sick  Monitor my health keep track of my blood pressure; keep track of my weight  Eat healthy foods eat more vegetables; eat fruit for snacks and desserts; eat baked foods instead of fried foods; eat foods that are low in salt; eat smaller portions  Be physically active find an activity I enjoy    No flowsheet data found.   Care Management & Community Referrals:  Referral 04/22/2013  Referrals made for care management support diabetes educator

## 2014-10-24 NOTE — Assessment & Plan Note (Addendum)
Assessment: Pt with last A1c of 6.3 on 07/11/14 compliant with oral hypoglycemic therapy with no symptomatic hypoglycemia who presents with CBG of 82 and improved A1c of 6.1   Plan:  -A1c 6.1at goal <7, discontinue metformin 500 mg daily (pt to finish current bottle) and follow-up in 6 months  -BP 138/89at goal <140/90, continue losartan 100 mg daily and HCTZ 25 mg daily  -LDL 67 at goal <100, continue atorvastatin 40 mg daily  -Last annual eye exam on 04/18/14 was normal  -Last annual foot exam was on 04/18/14 -Last urine microalbumin on 04/18/14 with 90.6 mg of proteinuria, continue losartan 100 mg daily  -BMI 28.96 not at goal <25, continue weight loss

## 2014-10-24 NOTE — Assessment & Plan Note (Addendum)
-  Obtain screening HCV Ab (born b/w 1947-65) and 25-OH vitamin D level  -Pt to have annual influenza vaccination soon -Pt not due for prevnar vaccination until age 49  ADDENDUM on 11/03/14: Pt with vitamin D insufficiency with level of 28.7, will start OTC vitamin D3 (cholecalciferol) 1000 U daily.

## 2014-10-24 NOTE — Progress Notes (Signed)
Patient ID: Collin Lopez, male   DOB: [birth year], 49 y.o.   MRN: [id number]     Subjective:   Patient ID: Collin Lopez male   DOB: October 16, 1965 49 y.o.   MRN: [id number]  HPI: [name] is a 49 y.o. very pleasant man with past medical history of pulmonary sarcoidosis, hypertension, hyperlipidemia, non-insulin dependent Type 2 DM, allergic rhinitis, GERD, and OSA who presents for follow-up of diabetes.   His last A1c was 6.3 on 07/11/14. He is compliant with taking metformin 500 mg daily with no side effects. He does not monitor his blood glucose. He has chronic polyuria (also on diuretic) but denies symptomatic hypoglycemia, polydipsia, polyphagia, blurry vision, neuropathy, or foot injury/ulcer. He follows a healthy diet and exercises regularly.He has lost 3 lb since last visit 3 months ago.   He is compliant with taking losartan and HCTZ for hypertension. He denies headache, chest pain, LE edema, or lightheadedness.     Past Medical History  Diagnosis Date  . Sarcoidosis   . Esophageal reflux   . Unspecified essential hypertension   . Tear of medial cartilage or meniscus of knee, current   . Congenital pes planus   . Abnormality of gait   . Pain in joint, lower leg   . OSA on CPAP   . Sleep apnea     uses cpap  . Allergy     seasonal  . Asthma    Current Outpatient Prescriptions  Medication Sig Dispense Refill  . albuterol (PROAIR HFA) 108 (90 BASE) MCG/ACT inhaler Inhale 2 puffs into the lungs every 6 (six) hours as needed for wheezing or shortness of breath. For shortness of breath 1 Inhaler 11  . aspirin 81 MG tablet Take 1 tablet (81 mg total) by mouth daily. 30 tablet   . atorvastatin (LIPITOR) 40 MG tablet Take 1 tablet (40 mg total) by mouth daily. 90 tablet 3  . cetirizine (ZYRTEC) 10 MG tablet Take 1 tablet (10 mg total) by mouth daily. 90 tablet 3  . Fluticasone-Salmeterol (ADVAIR DISKUS) 250-50 MCG/DOSE AEPB Inhale 1 puff into the lungs every 12  hours. 60 each 11  . hydrochlorothiazide (HYDRODIURIL) 25 MG tablet Take 1 tablet (25 mg total) by mouth daily. 90 tablet 3  . metFORMIN (GLUCOPHAGE) 500 MG tablet Take 1 tablet (500 mg total) by mouth daily with breakfast. 30 tablet 5  . mometasone (NASONEX) 50 MCG/ACT nasal spray Place 2 sprays into the nose daily. 17 g 6  . montelukast (SINGULAIR) 10 MG tablet Take 1 tablet (10 mg total) by mouth at bedtime. 30 tablet 5  . tadalafil (CIALIS) 10 MG tablet Take 1 tablet (10 mg total) by mouth as needed for erectile dysfunction. 20 tablet 1   No current facility-administered medications for this visit.   Family History  Problem Relation Age of Onset  . Heart disease Father     Died 3 years ago due to CHF and refused pacemaker.  . Cancer Mother     breast cancer passed away in 2009-07-01.  . Colon cancer Neg Hx   . Rectal cancer Neg Hx   . Stomach cancer Neg Hx    History   Social History  . Marital Status: Married    Spouse Name: Williard Keller  . Number of Children: N/A  . Years of Education: 10   Occupational History  . Environmental services   .  [place]   Social History Main Topics  . Smoking status: Former  Smoker -- 0.20 packs/day for 3 years    Types: Cigarettes    Quit date: 03/21/1993  . Smokeless tobacco: Never Used     Comment: 1ppd x 2 years  . Alcohol Use: No  . Drug Use: No  . Sexual Activity:    Partners: Female   Other Topics Concern  . 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         Review of Systems: Review of Systems  Constitutional: Positive for weight loss (intentional ).  Eyes: Negative for blurred vision.  Respiratory: Negative for cough, shortness of breath and wheezing.   Cardiovascular: Negative for chest pain and leg swelling.  Gastrointestinal: Positive for heartburn (controlled on PPI). Negative for nausea, vomiting, abdominal pain and diarrhea.  Genitourinary: Negative for dysuria,  urgency, frequency and hematuria.  Musculoskeletal: Positive for joint pain (chronic left shoulder pain). Negative for myalgias.  Neurological: Negative for dizziness, sensory change and headaches.  Endo/Heme/Allergies: Positive for environmental allergies (controlled). Negative for polydipsia.    Objective:  Physical Exam: Filed Vitals:   10/24/14 1332  BP: 138/89  Pulse: 75  Temp: 98.2 F (36.8 C)  TempSrc: Oral  Resp: 20  Height: 5' 5.5" (1.664 m)  Weight: 176 lb 12.8 oz (80.196 kg)  SpO2: 100%    Physical Exam  Constitutional: He is oriented to person, place, and time. He appears well-developed and well-nourished. No distress.  HENT:  Head: Normocephalic and atraumatic.  Right Ear: External ear normal.  Left Ear: External ear normal.  Nose: Nose normal.  Mouth/Throat: Oropharynx is clear and moist. No oropharyngeal exudate.  Eyes: Conjunctivae and EOM are normal. Pupils are equal, round, and reactive to light. Right eye exhibits no discharge. Left eye exhibits no discharge. No scleral icterus.  Neck: Normal range of motion. Neck supple.  Cardiovascular: Normal rate, regular rhythm and normal heart sounds.   No murmur heard. Pulmonary/Chest: Effort normal and breath sounds normal. No respiratory distress. He has no wheezes. He has no rales.  Abdominal: Soft. Bowel sounds are normal. He exhibits no distension. There is no tenderness. There is no rebound and no guarding.  Musculoskeletal: Normal range of motion. He exhibits no edema or tenderness.  Neurological: He is alert and oriented to person, place, and time.  Skin: Skin is warm and dry. No rash noted. He is not diaphoretic. No erythema. No pallor.  Psychiatric: He has a normal mood and affect. His behavior is normal. Judgment and thought content normal.    Assessment & Plan:   Please see problem list for problem-based assessment and plan

## 2014-10-24 NOTE — Assessment & Plan Note (Signed)
Assessment: Pt with moderate to well-controlled hypertension compliant with two-class (diuretic & ARB) anti-hypertensive therapy who presents with blood pressure of 138/89.   Plan:  -BP 138/89 at goal <140/90  -Continue losartan 100 mg daily and HCTZ 25 mg daily  -Obtain BMP

## 2014-10-25 LAB — BASIC METABOLIC PANEL
BUN/Creatinine Ratio: 20 (ref 9–20)
BUN: 19 mg/dL (ref 6–24)
CHLORIDE: 102 mmol/L (ref 97–108)
CO2: 25 mmol/L (ref 18–29)
Calcium: 9.7 mg/dL (ref 8.7–10.2)
Creatinine, Ser: 0.95 mg/dL (ref 0.76–1.27)
GFR calc non Af Amer: 92 mL/min/{1.73_m2} (ref >59)
GFR, EST AFRICAN AMERICAN: 107 mL/min/{1.73_m2} (ref >59)
GLUCOSE: 84 mg/dL (ref 65–99)
POTASSIUM: 3.9 mmol/L (ref 3.5–5.2)
Sodium: 143 mmol/L (ref 134–144)

## 2014-10-25 LAB — VITAMIN D 25 HYDROXY (VIT D DEFICIENCY, FRACTURES): Vit D, 25-Hydroxy: 28.7 ng/mL — ABNORMAL LOW (ref 30.0–100.0)

## 2014-10-25 LAB — HEPATITIS C ANTIBODY

## 2014-10-27 NOTE — Progress Notes (Signed)
Internal Medicine Clinic Attending  Case discussed with Dr. Rabbani soon after the resident saw the patient.  We reviewed the resident's history and exam and pertinent patient test results.  I agree with the assessment, diagnosis, and plan of care documented in the resident's note.  

## 2014-11-03 DIAGNOSIS — E559 Vitamin D deficiency, unspecified: Secondary | ICD-10-CM | POA: Insufficient documentation

## 2014-11-03 MED ORDER — VITAMIN D (CHOLECALCIFEROL) 25 MCG (1000 UT) PO TABS: 1.0000 | ORAL_TABLET | Freq: Every day | ORAL | Status: DC

## 2014-11-03 NOTE — Addendum Note (Signed)
Addended byJuluis Mire on: 11/03/2014 01:44 PM   Modules accepted: Orders

## 2014-11-03 NOTE — Assessment & Plan Note (Addendum)
Assessment: Pt with vitamin D insufficiency with level of 28.7 on 10/24/14 who presents with no fall or fracture.   Plan:  -Start OTC vitamin D3 (cholecalciferol) 1000 U daily

## 2014-11-12 ENCOUNTER — Other Ambulatory Visit: Payer: Self-pay | Admitting: Internal Medicine

## 2014-11-12 DIAGNOSIS — I10 Essential (primary) hypertension: Secondary | ICD-10-CM

## 2014-11-12 MED ORDER — LOSARTAN POTASSIUM 100 MG PO TABS: 100.0000 mg | ORAL_TABLET | Freq: Every day | ORAL | Status: DC

## 2014-11-19 ENCOUNTER — Encounter: Payer: Self-pay | Admitting: Sports Medicine

## 2014-11-19 ENCOUNTER — Ambulatory Visit (INDEPENDENT_AMBULATORY_CARE_PROVIDER_SITE_OTHER): Payer: 59 | Admitting: Sports Medicine

## 2014-11-19 VITALS — BP 126/87 | HR 76 | Ht 67.0 in | Wt 176.0 lb

## 2014-11-19 DIAGNOSIS — M25512 Pain in left shoulder: Secondary | ICD-10-CM | POA: Diagnosis not present

## 2014-11-19 NOTE — Assessment & Plan Note (Addendum)
49 yo with shoulder pain for several months. Now s/p 2 injections with good relief. Last inj 10/20/2014 and still has good, although not complete relief.  Exam suggests possible labral tear. Exam and u/s not consistent with RC injury.   - Provided with Blue and Green therabands. Instructed how to do Jobe's RC exercises as well as scapular stabilizing exercises. Preferred not to do any formal therapy at this point.  - Advised against doing any push ups or other axial loading due to concerns about labral pathology - f/u in 6 weeks

## 2014-11-19 NOTE — Progress Notes (Signed)
Collin Lopez - 49 y.o. male MRN [id number]  Date of birth: 23-Jul-1965  CC: Left shoulder pain.   SUBJECTIVE:   HPI Left Shoulder pain:  - pain in the proximal aspect of his left shoulder.  - Last seen on 10/20/2014 - He was also seen in 03/2014 and had a subacromial inj that relieved his pain for 6 months.  - On 10/20/2014 he had a second injection, that has helped. The pain is still present, but much less and only with certain movements.    - Currently having some discomfort with abduction and arm extension (when in car and reaching into back seat) .  -  He has been doing rehab exercises regularly with band work.  He also tries doing push ups, but this is painful.   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.  Supervisor is cooperating with modifying jobs.   - No longer waking him up at night.   - Continuing to take ibuprofen daily, now taking 800mg  tid per recommendation from PCP for HA.  - Denies fevers, chills, night sweats.   Imagine: Reviewed 10/21/14 imaging. No acute abnormality.    ROS:     14 point RoS negative in regards to orthopedic complaint.   HISTORY: Past Medical, Surgical, Social, and Family History Reviewed & Updated per EMR.  Pertinent Historical Findings include: Diabetes, pulmonary sarcoid. Taking metformin, losartan, lipitor, ASA.   OBJECTIVE: There were no vitals taken for this visit.  Physical Exam Gen: Calm, no distress Non-labored, full sentences.  Shoulder: left Inspection reveals no abnormalities, atrophy or asymmetry. Palpation is abnormal with mild tenderness over the [facility] joint & the bicipital groove ROM is full in all planes. Rotator cuff strength normal throughout. Pain with Resisted Shoulder abduction at 90 degrees.  Minimal pain with Hawkin's and Neer tests. Empty can not painful.   Pain with cross arm test.  Speeds and Yergason's tests normal. + labral pathology noted with Obrien's.  Normal scapular function observed. No painful  arc and no drop arm sign. No apprehension sign   Imaging: [place] image of the R/L shoulder in both long and short axis obtained. Biceps tendon appears normal fibrillar pattern without surrounding effusion. Subscapularis appears normal without obvious tears or abnormalities (although it is thinner than normal). The supraspinatus tendon appears normal with no tears and a good footprint.  There is a spur in the middle of the supraspinatus footprint, although there does not appear to be an associated tear or any tendon abnormality. The infraspinatus appears normal with no tears and a good footprints.The [facility] joint appears to have ample joint space and no spurring or effusion is present. Overall, the exam was unremarkable other than a spur located in the middle of the SS footprint.   MEDICATIONS, LABS & OTHER ORDERS: Previous Medications   ALBUTEROL (PROAIR HFA) 108 (90 BASE) MCG/ACT INHALER    Inhale 2 puffs into the lungs every 6 (six) hours as needed for wheezing or shortness of breath. For shortness of breath   ASPIRIN 81 MG TABLET    Take 1 tablet (81 mg total) by mouth daily.   ATORVASTATIN (LIPITOR) 40 MG TABLET    Take 1 tablet (40 mg total) by mouth daily.   CETIRIZINE (ZYRTEC) 10 MG TABLET    Take 1 tablet (10 mg total) by mouth daily.   FLUTICASONE-SALMETEROL (ADVAIR DISKUS) 250-50 MCG/DOSE AEPB    Inhale 1 puff into the lungs every 12 hours.   HYDROCHLOROTHIAZIDE (HYDRODIURIL) 25  MG TABLET    Take 1 tablet (25 mg total) by mouth daily.   LOSARTAN (COZAAR) 100 MG TABLET    Take 1 tablet (100 mg total) by mouth daily.   MOMETASONE (NASONEX) 50 MCG/ACT NASAL SPRAY    Place 2 sprays into the nose daily.   MONTELUKAST (SINGULAIR) 10 MG TABLET    Take 1 tablet (10 mg total) by mouth at bedtime.   TADALAFIL (CIALIS) 10 MG TABLET    Take 1 tablet (10 mg total) by mouth as needed for erectile dysfunction.   VITAMIN D, CHOLECALCIFEROL, 1000 UNITS TABS    Take 1 tablet by mouth daily.   Modified  Medications   No medications on file   New Prescriptions   No medications on file   Discontinued Medications   No medications on file  No orders of the defined types were placed in this encounter.   ASSESSMENT & PLAN: See problem based charting & AVS for pt instructions.

## 2014-12-11 ENCOUNTER — Ambulatory Visit (INDEPENDENT_AMBULATORY_CARE_PROVIDER_SITE_OTHER): Payer: 59 | Admitting: Internal Medicine

## 2014-12-11 ENCOUNTER — Encounter: Payer: Self-pay | Admitting: Internal Medicine

## 2014-12-11 VITALS — BP 134/87 | HR 70 | Temp 98.0°F | Ht 67.5 in | Wt 180.6 lb

## 2014-12-11 DIAGNOSIS — I10 Essential (primary) hypertension: Secondary | ICD-10-CM | POA: Diagnosis not present

## 2014-12-11 DIAGNOSIS — L84 Corns and callosities: Secondary | ICD-10-CM | POA: Insufficient documentation

## 2014-12-11 DIAGNOSIS — K219 Gastro-esophageal reflux disease without esophagitis: Secondary | ICD-10-CM

## 2014-12-11 LAB — GLUCOSE, CAPILLARY: Glucose-Capillary: 92 mg/dL (ref 65–99)

## 2014-12-11 MED ORDER — TRAMADOL HCL 50 MG PO TABS: 50.0000 mg | ORAL_TABLET | Freq: Four times a day (QID) | ORAL | Status: DC | PRN

## 2014-12-11 MED ORDER — PANTOPRAZOLE SODIUM 40 MG PO PACK: 40.0000 mg | PACK | Freq: Every day | ORAL | Status: DC

## 2014-12-11 NOTE — Progress Notes (Signed)
Internal Medicine Clinic Attending  Case discussed with Dr. Emokpae at the time of the visit.  We reviewed the resident's history and exam and pertinent patient test results.  I agree with the assessment, diagnosis, and plan of care documented in the resident's note.  

## 2014-12-11 NOTE — Assessment & Plan Note (Signed)
Callus has been present for a while, now pointed and painful, underneath left feet. No signs of infection. Pt has been taking Ibuprofen 800mg  (4 tabs of OTC) q6H without significant relief.   Plan- Will refer to podiatry - Stop Ibuprofen for now, prescribe tramadol- 50mg  Q6H, #30 tabs till he can see podiatry.

## 2014-12-11 NOTE — Progress Notes (Signed)
Patient ID: Collin Lopez, male   DOB: 05-28-1965, 49 y.o.   MRN: [id number]   Subjective:   Patient ID: Collin Lopez male   DOB: [birth year] 49 y.o.   MRN: [id number]  HPI: [name] is a 49 y.o. with PMH listed below, presented today with c/o foot pain, that started a few days ago. He has always had this mild swelling on the sole of his left feet, but has been able to bear weight without complaints. Now area is tender such that he is unable to bear weight on feet. He denies similar episodes in the past, denies prior trauma. He denies fever, malaise, redness. He is a diabetic, not on medications.   Past Medical History  Diagnosis Date  . Sarcoidosis   . Esophageal reflux   . Unspecified essential hypertension   . Tear of medial cartilage or meniscus of knee, current   . Congenital pes planus   . Abnormality of gait   . Pain in joint, lower leg   . OSA on CPAP   . Sleep apnea     uses cpap  . Allergy     seasonal  . Asthma    Current Outpatient Prescriptions  Medication Sig Dispense Refill  . albuterol (PROAIR HFA) 108 (90 BASE) MCG/ACT inhaler Inhale 2 puffs into the lungs every 6 (six) hours as needed for wheezing or shortness of breath. For shortness of breath 1 Inhaler 11  . aspirin 81 MG tablet Take 1 tablet (81 mg total) by mouth daily. 30 tablet   . atorvastatin (LIPITOR) 40 MG tablet Take 1 tablet (40 mg total) by mouth daily. 90 tablet 3  . cetirizine (ZYRTEC) 10 MG tablet Take 1 tablet (10 mg total) by mouth daily. 90 tablet 3  . Fluticasone-Salmeterol (ADVAIR DISKUS) 250-50 MCG/DOSE AEPB Inhale 1 puff into the lungs every 12 hours. 60 each 11  . hydrochlorothiazide (HYDRODIURIL) 25 MG tablet Take 1 tablet (25 mg total) by mouth daily. 90 tablet 3  . losartan (COZAAR) 100 MG tablet Take 1 tablet (100 mg total) by mouth daily. 90 tablet 3  . mometasone (NASONEX) 50 MCG/ACT nasal spray Place 2 sprays into the nose daily. 17 g 6  . montelukast (SINGULAIR) 10 MG  tablet Take 1 tablet (10 mg total) by mouth at bedtime. 30 tablet 5  . pantoprazole sodium (PROTONIX) 40 mg/20 mL PACK Place 20 mLs (40 mg total) into feeding tube daily. 30 each 2  . tadalafil (CIALIS) 10 MG tablet Take 1 tablet (10 mg total) by mouth as needed for erectile dysfunction. 20 tablet 1  . traMADol (ULTRAM) 50 MG tablet Take 1 tablet (50 mg total) by mouth every 6 (six) hours as needed. 30 tablet 0  . Vitamin D, Cholecalciferol, 1000 UNITS TABS Take 1 tablet by mouth daily. 90 tablet 3   No current facility-administered medications for this visit.   Family History  Problem Relation Age of Onset  . Heart disease Father     Died 3 years ago due to CHF and refused pacemaker.  . Cancer Mother     breast cancer passed away in 25-Jun-2009.  . Colon cancer Neg Hx   . Rectal cancer Neg Hx   . Stomach cancer Neg Hx    Social History   Social History  . Marital Status: Married    Spouse Name: Nain Rudd  . Number of Children: N/A  . Years of Education: 10   Occupational History  . Environmental  services   .  [place]   Social History Main Topics  . Smoking status: Former Smoker -- 0.20 packs/day for 3 years    Types: Cigarettes    Quit date: 03/21/1993  . Smokeless tobacco: Never Used     Comment: 1ppd x 2 years  . Alcohol Use: No  . Drug Use: No  . Sexual Activity:    Partners: Female   Other Topics Concern  . 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         Review of Systems: CONSTITUTIONAL- No Fever,  SKIN- No Rash, colour changes or itching. HEAD- No Headache or dizziness. EYES- No Vision loss, pain, redness, double or blurred vision. RESPIRATORY- No Cough or SOB. CARDIAC- No Palpitations or chest pain. GI- No vomiting, diarrhoea,, abd pain. URINARY- No Frequency, urgency, straining or dysuria. NEUROLOGIC- No Numbness, syncope, seizures.  Objective:  Physical Exam: Filed Vitals:   12/11/14 0925  BP: 134/87    Pulse: 70  Temp: 98 F (36.7 C)  TempSrc: Oral  Height: 5' 7.5" (1.715 m)  Weight: 180 lb 9.6 oz (81.92 kg)  SpO2: 100%   GENERAL- alert, co-operative, not in any distress. HEENT- Atraumatic, normocephalic, PERRL, neck supple. CARDIAC- RRR, no murmurs, rubs or gallops. RESP- Moving equal volumes of air, no wheezes or crackles. ABDOMEN- Soft, nontender BACK-  No tenderness along the vertebrae, NEURO- Alert and oriented, Gait- limping to avoid bearing weight on left feet. EXTREMITIES- pulse 2+, symmetric, no pedal edema, left foot- dry and whitish coloured callus on sole, just proximal to the space between the 2nd and 3rd metartasal, no surrounding erythema, callus itself painful to touch but no surrounding tenderness, also has another callus non tender distal to this. SKIN- Warm, dry, No rash or lesion. PSYCH- Normal mood and affect, appropriate thought content and speech.  Assessment & Plan:   The patient's case and plan of care was discussed with attending physician, Dr. Ellwood Dense.  Please see problem based charting for assessment and plan.

## 2014-12-11 NOTE — Assessment & Plan Note (Signed)
Symptoms consistent with GERD, reflux/burning symptoms present only after meals.  Plan- refilled protonix- 40mg  daily.

## 2014-12-11 NOTE — Assessment & Plan Note (Signed)
BP Readings from Last 3 Encounters:  12/11/14 134/87  11/19/14 126/87  10/24/14 138/89    Lab Results  Component Value Date   NA 143 10/24/2014   K 3.9 10/24/2014   CREATININE 0.95 10/24/2014    Assessment: Blood pressure control:  Controlled  Plan: Medications:  Complaint with meds, cont. Other plans:

## 2014-12-11 NOTE — Patient Instructions (Signed)
For the tramadol, start with one tablet for the pain. If this does not help you can then try 2 tablets.   We will send you to a foot doctor to get your feet check out.

## 2014-12-12 ENCOUNTER — Encounter: Payer: Self-pay | Admitting: Podiatry

## 2014-12-12 ENCOUNTER — Ambulatory Visit (INDEPENDENT_AMBULATORY_CARE_PROVIDER_SITE_OTHER): Payer: 59 | Admitting: Podiatry

## 2014-12-12 DIAGNOSIS — Q828 Other specified congenital malformations of skin: Secondary | ICD-10-CM | POA: Diagnosis not present

## 2014-12-12 DIAGNOSIS — L03116 Cellulitis of left lower limb: Secondary | ICD-10-CM | POA: Diagnosis not present

## 2014-12-12 MED ORDER — DOXYCYCLINE HYCLATE 100 MG PO TABS: 100.0000 mg | ORAL_TABLET | Freq: Two times a day (BID) | ORAL | Status: DC

## 2014-12-12 NOTE — Progress Notes (Signed)
Subjective:     Patient ID: Collin Lopez, male   DOB: 05/23/1965, 49 y.o.   MRN: [id number]  HPI Tis patient presents to the office with painful callus under his left forefoot.  He says the foot became painful this past ZMonday and worsened during the course of the week.  He was seen by medical doctor who prescribed Tramadol for his pain.  He called the office this morning and walks to the treatment room with antalgic gait putting no weight on his left forefoot.  He says his foot is painful and has become significantly swollen .  The site of skin lesion  Has severe palpable pain and redness and swelling noted around the skin lesion. There is no  Fluctuance but a nodular lesion is under the skin lesion.  There is mild increased temperature noted plantar left foot.  Patient admits his tramadol is causing him to be nautious and he does get sick prior to leaving.  Review of Systems     Objective:   Physical Exam GENERAL APPEARANCE: Alert, conversant. Appropriately groomed. No acute distress.  VASCULAR: Pedal pulses palpable at  [facility] and PT bilateral.  Capillary refill time is immediate to all digits,  Normal temperature gradient.  Digital hair growth is present bilateral  NEUROLOGIC: sensation is normal to 5.07 monofilament at 5/5 sites bilateral.  Light touch is intact bilateral, Muscle strength normal.  MUSCULOSKELETAL: acceptable muscle strength, tone and stability bilateral.  Intrinsic muscluature intact bilateral.  Rectus appearance of foot and digits noted bilateral.   DERMATOLOGIC: skin color, texture, and turgor are within normal limits.  No preulcerative lesions or ulcers  are seen, no interdigital maceration noted.  No open lesions present.  Digital nails are asymptomatic. No drainage noted. There are two porokeratotic lesions left forefoot.  There is hard cystic area under the proximal porokeratosis surrounded by redness and swelling. Severe palpable pain upon palpation of the proximal  porokeratotic lesion.      Assessment:     Cellulitis left foot  Cyst left forefoot.    Plan:     IE  X-ray was taken revealing no bony pathology and no foreign body.  Prescribed doxycycline and dispensed cam walker.  Patient to call on Monday if the foot continues to be painful.  It appears to possible inclusion cyst which has become inflamed.  My plan is to clear up the infection and then address the cyst. The swelling in his left foot could be caused by his ambulating with compensatory gait. I will be called Monday and then will schedule him a follow up appointment based on his progress.

## 2014-12-13 ENCOUNTER — Encounter (HOSPITAL_COMMUNITY): Payer: Self-pay | Admitting: *Deleted

## 2014-12-13 ENCOUNTER — Observation Stay (HOSPITAL_COMMUNITY)
Admission: EM | Admit: 2014-12-13 | Discharge: 2014-12-14 | Disposition: A | Discharge status: Home / Self Care | Payer: 59 | Attending: Internal Medicine | Admitting: Internal Medicine

## 2014-12-13 ENCOUNTER — Telehealth: Payer: Self-pay | Admitting: Podiatry

## 2014-12-13 DIAGNOSIS — E119 Type 2 diabetes mellitus without complications: Secondary | ICD-10-CM

## 2014-12-13 DIAGNOSIS — Z9889 Other specified postprocedural states: Secondary | ICD-10-CM

## 2014-12-13 DIAGNOSIS — D869 Sarcoidosis, unspecified: Secondary | ICD-10-CM | POA: Diagnosis not present

## 2014-12-13 DIAGNOSIS — L03116 Cellulitis of left lower limb: Secondary | ICD-10-CM | POA: Diagnosis present

## 2014-12-13 DIAGNOSIS — K219 Gastro-esophageal reflux disease without esophagitis: Secondary | ICD-10-CM | POA: Insufficient documentation

## 2014-12-13 DIAGNOSIS — E785 Hyperlipidemia, unspecified: Secondary | ICD-10-CM | POA: Diagnosis not present

## 2014-12-13 DIAGNOSIS — R269 Unspecified abnormalities of gait and mobility: Secondary | ICD-10-CM | POA: Diagnosis not present

## 2014-12-13 DIAGNOSIS — G4733 Obstructive sleep apnea (adult) (pediatric): Secondary | ICD-10-CM | POA: Diagnosis not present

## 2014-12-13 DIAGNOSIS — L02612 Cutaneous abscess of left foot: Principal | ICD-10-CM | POA: Diagnosis present

## 2014-12-13 DIAGNOSIS — Z87891 Personal history of nicotine dependence: Secondary | ICD-10-CM | POA: Insufficient documentation

## 2014-12-13 DIAGNOSIS — Z7982 Long term (current) use of aspirin: Secondary | ICD-10-CM | POA: Insufficient documentation

## 2014-12-13 DIAGNOSIS — Z79899 Other long term (current) drug therapy: Secondary | ICD-10-CM

## 2014-12-13 DIAGNOSIS — Z885 Allergy status to narcotic agent status: Secondary | ICD-10-CM | POA: Insufficient documentation

## 2014-12-13 DIAGNOSIS — J45909 Unspecified asthma, uncomplicated: Secondary | ICD-10-CM | POA: Insufficient documentation

## 2014-12-13 DIAGNOSIS — M79672 Pain in left foot: Secondary | ICD-10-CM | POA: Diagnosis present

## 2014-12-13 DIAGNOSIS — I10 Essential (primary) hypertension: Secondary | ICD-10-CM | POA: Insufficient documentation

## 2014-12-13 DIAGNOSIS — B9689 Other specified bacterial agents as the cause of diseases classified elsewhere: Secondary | ICD-10-CM | POA: Diagnosis not present

## 2014-12-13 HISTORY — DX: Type 2 diabetes mellitus without complications: E11.9

## 2014-12-13 LAB — BASIC METABOLIC PANEL
Anion gap: 8 (ref 5–15)
BUN: 16 mg/dL (ref 6–20)
CALCIUM: 9.6 mg/dL (ref 8.9–10.3)
CO2: 31 mmol/L (ref 22–32)
CREATININE: 1.11 mg/dL (ref 0.61–1.24)
Chloride: 104 mmol/L (ref 101–111)
GFR calc Af Amer: >60 mL/min (ref >60)
GFR calc non Af Amer: >60 mL/min (ref >60)
GLUCOSE: 117 mg/dL — AB (ref 65–99)
Potassium: 3.6 mmol/L (ref 3.5–5.1)
Sodium: 143 mmol/L (ref 135–145)

## 2014-12-13 LAB — CBC WITH DIFFERENTIAL/PLATELET
BASOS PCT: 1 %
Basophils Absolute: 0 10*3/uL (ref 0.0–0.1)
Eosinophils Absolute: 0.1 10*3/uL (ref 0.0–0.7)
Eosinophils Relative: 2 %
HEMATOCRIT: 40.7 % (ref 39.0–52.0)
Hemoglobin: 13.4 g/dL (ref 13.0–17.0)
LYMPHS PCT: 43 %
Lymphs Abs: 3.2 10*3/uL (ref 0.7–4.0)
MCH: 29.9 pg (ref 26.0–34.0)
MCHC: 32.9 g/dL (ref 30.0–36.0)
MCV: 90.8 fL (ref 78.0–100.0)
MONO ABS: 0.5 10*3/uL (ref 0.1–1.0)
MONOS PCT: 7 %
NEUTROS ABS: 3.6 10*3/uL (ref 1.7–7.7)
Neutrophils Relative %: 47 %
Platelets: 312 10*3/uL (ref 150–400)
RBC: 4.48 MIL/uL (ref 4.22–5.81)
RDW: 13.7 % (ref 11.5–15.5)
WBC: 7.5 10*3/uL (ref 4.0–10.5)

## 2014-12-13 MED ORDER — VANCOMYCIN HCL IN DEXTROSE 1-5 GM/200ML-% IV SOLN
1000.0000 mg | Freq: Once | INTRAVENOUS | Status: AC
  Administered 2014-12-13: 1000 mg via INTRAVENOUS
  Filled 2014-12-13: qty 200

## 2014-12-13 MED ORDER — PIPERACILLIN-TAZOBACTAM 3.375 G IVPB 30 MIN
3.3750 g | Freq: Once | INTRAVENOUS | Status: AC
  Administered 2014-12-13: 3.375 g via INTRAVENOUS
  Filled 2014-12-13: qty 50

## 2014-12-13 MED ORDER — LIDOCAINE-EPINEPHRINE (PF) 2 %-1:200000 IJ SOLN
20.0000 mL | Freq: Once | INTRAMUSCULAR | Status: AC
  Administered 2014-12-13: 20 mL
  Filled 2014-12-13: qty 20

## 2014-12-13 MED ORDER — SODIUM CHLORIDE 0.9 % IV SOLN
INTRAVENOUS | Status: DC
  Administered 2014-12-13: 22:00:00 via INTRAVENOUS

## 2014-12-13 NOTE — ED Notes (Signed)
Pt reports having callous removed from left foot yesterday at foot dr and now having increase in swelling, redness, drainage. Pt is diabetic and reports fever last night.

## 2014-12-13 NOTE — H&P (Signed)
Date: 12/13/2014               Patient Name:  Collin Lopez MRN: [id number]  DOB: 04-Sep-1965 Age / Sex: 49 y.o., male   PCP: Juluis Mire, MD         Medical Service: Internal Medicine Teaching Service         Attending Physician: Dr. Carlyle Basques, MD    First Contact: Dr. Marlowe Sax Pager: [phone]  Second Contact: Dr. Naaman Plummer Pager: [phone]       After Hours (After 5p/  First Contact Pager: [phone]  weekends / holidays): Second Contact Pager: [phone]   Chief Complaint: foot pain  History of Present Illness:  Mr. Salvia is a 49 yo man with history of NIDDM just off of metformin last month due to the excellent A1c of 6.1, HTN, asthma, and sarcoidosis who came in for pain and swelling in the mid-plantar area of the left foot.  He said that he had a callus there for a long time, but he stepped in on a plastic spike in the shower mat 5 days back,and since then the swelling and pain got worse and said there was a pocket of pus there which the wife wanted to drain it, but they did not do that. He says his pain level was about a 9 out of 10. He saw podiatrist yesterday placed him on doxycycline, but since his swelling and pain increased, they came in the ER for I&D and antibiotics. Last night, the patient had subjective fever, no n/v/ or other symptoms.  In the ER, the site was drained and had purulent material, he received a dose of vanc and zosyn. And we were called to admit for overnight observation.   Meds: Current Facility-Administered Medications  Medication Dose Route Frequency Arlow Spiers Last Rate Last Dose  . 0.9 %  sodium chloride infusion   Intravenous Continuous Lajean Saver, MD 10 mL/hr at 12/13/14 2203    . vancomycin (VANCOCIN) IVPB 1000 mg/200 mL premix  1,000 mg Intravenous Once Lajean Saver, MD 200 mL/hr at 12/13/14 2233 1,000 mg at 12/13/14 2233   Current Outpatient Prescriptions  Medication Sig Dispense Refill  . albuterol (PROAIR HFA) 108 (90 BASE) MCG/ACT  inhaler Inhale 2 puffs into the lungs every 6 (six) hours as needed for wheezing or shortness of breath. For shortness of breath 1 Inhaler 11  . aspirin 81 MG tablet Take 1 tablet (81 mg total) by mouth daily. 30 tablet   . atorvastatin (LIPITOR) 40 MG tablet Take 1 tablet (40 mg total) by mouth daily. 90 tablet 3  . cetirizine (ZYRTEC) 10 MG tablet Take 1 tablet (10 mg total) by mouth daily. 90 tablet 3  . doxycycline (VIBRA-TABS) 100 MG tablet Take 1 tablet (100 mg total) by mouth 2 (two) times daily. 20 tablet 0  . Fluticasone-Salmeterol (ADVAIR DISKUS) 250-50 MCG/DOSE AEPB Inhale 1 puff into the lungs every 12 hours. 60 each 11  . hydrochlorothiazide (HYDRODIURIL) 25 MG tablet Take 1 tablet (25 mg total) by mouth daily. 90 tablet 3  . ibuprofen (ADVIL,MOTRIN) 200 MG tablet Take 400 mg by mouth every 6 (six) hours as needed for moderate pain.    Marland Kitchen losartan (COZAAR) 100 MG tablet Take 1 tablet (100 mg total) by mouth daily. 90 tablet 3  . mometasone (NASONEX) 50 MCG/ACT nasal spray Place 2 sprays into the nose daily. 17 g 6  . montelukast (SINGULAIR) 10 MG tablet Take 1 tablet (10 mg total) by  mouth at bedtime. 30 tablet 5  . pantoprazole sodium (PROTONIX) 40 mg/20 mL PACK Place 20 mLs (40 mg total) into feeding tube daily. 30 each 2  . tadalafil (CIALIS) 10 MG tablet Take 1 tablet (10 mg total) by mouth as needed for erectile dysfunction. 20 tablet 1  . Vitamin D, Cholecalciferol, 1000 UNITS TABS Take 1 tablet by mouth daily. 90 tablet 3  . traMADol (ULTRAM) 50 MG tablet Take 1 tablet (50 mg total) by mouth every 6 (six) hours as needed. 30 tablet 0    Allergies: Allergies as of 12/13/2014 - Review Complete 12/13/2014  Allergen Reaction Noted  . Tramadol Nausea And Vomiting 12/13/2014   Past Medical History  Diagnosis Date  . Sarcoidosis   . Esophageal reflux   . Unspecified essential hypertension   . Tear of medial cartilage or meniscus of knee, current   . Congenital pes planus   .  Abnormality of gait   . Pain in joint, lower leg   . OSA on CPAP   . Sleep apnea     uses cpap  . Allergy     seasonal  . Asthma   . Diabetes mellitus without complication    Past Surgical History  Procedure Laterality Date  . Knee surgery      left knee repair for medical meniscus tear   Family History  Problem Relation Age of Onset  . Heart disease Father     Died 3 years ago due to CHF and refused pacemaker.  . Cancer Mother     breast cancer passed away in 04-Aug-2009.  . Colon cancer Neg Hx   . Rectal cancer Neg Hx   . Stomach cancer Neg Hx    Social History   Social History  . Marital Status: Married    Spouse Name: Eugene Isadore  . Number of Children: N/A  . Years of Education: 10   Occupational History  . Environmental services   .  [place]   Social History Main Topics  . Smoking status: Former Smoker -- 0.20 packs/day for 3 years    Types: Cigarettes    Quit date: 03/21/1993  . Smokeless tobacco: Never Used     Comment: 1ppd x 2 years  . Alcohol Use: No  . Drug Use: No  . Sexual Activity:    Partners: Female   Other Topics Concern  . 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          Review of Systems: 12 point ROS done and was unremarkable, except per HPI.  Physical Exam: Blood pressure 125/74, pulse 71, temperature 98.4 F (36.9 C), temperature source Oral, resp. rate 12, SpO2 100 %.   General: A&O, in NAD, resting in bed  HEENT: EOMI Neck: supple, midline trachea,  CV: RRR, normal s1, s2, no m/r/g,  Resp: equal and symmetric breath sounds, no wheezing heard Abdomen: soft, nontender, nondistended Extremities: pulses intact b/l,  Left foot diffusely swollen, and the plantar region had just been done an I&D when examined, no further purulent drainage noted, and it was still numb from lidocaine     Lab results: Results for orders placed or performed during the hospital encounter of  12/13/14 (from the past 24 hour(s))  CBC with Differential/Platelet     Status: None   Collection Time: 12/13/14 10:00 PM  Result Value Ref Range   WBC 7.5 4.0 - 10.5 K/uL  RBC 4.48 4.22 - 5.81 MIL/uL   Hemoglobin 13.4 13.0 - 17.0 g/dL   HCT 40.7 39.0 - 52.0 %   MCV 90.8 78.0 - 100.0 fL   MCH 29.9 26.0 - 34.0 pg   MCHC 32.9 30.0 - 36.0 g/dL   RDW 13.7 11.5 - 15.5 %   Platelets 312 150 - 400 K/uL   Neutrophils Relative % 47 %   Neutro Abs 3.6 1.7 - 7.7 K/uL   Lymphocytes Relative 43 %   Lymphs Abs 3.2 0.7 - 4.0 K/uL   Monocytes Relative 7 %   Monocytes Absolute 0.5 0.1 - 1.0 K/uL   Eosinophils Relative 2 %   Eosinophils Absolute 0.1 0.0 - 0.7 K/uL   Basophils Relative 1 %   Basophils Absolute 0.0 0.0 - 0.1 K/uL  Basic metabolic panel     Status: Abnormal   Collection Time: 12/13/14 10:00 PM  Result Value Ref Range   Sodium 143 135 - 145 mmol/L   Potassium 3.6 3.5 - 5.1 mmol/L   Chloride 104 101 - 111 mmol/L   CO2 31 22 - 32 mmol/L   Glucose, Bld 117 (H) 65 - 99 mg/dL   BUN 16 6 - 20 mg/dL   Creatinine, Ser 1.11 0.61 - 1.24 mg/dL   Calcium 9.6 8.9 - 10.3 mg/dL   GFR calc non Af Amer >60 >60 mL/min   GFR calc Af Amer >60 >60 mL/min   Anion gap 8 5 - 15      Imaging results:  No results found.    Assessment & Plan by Problem: Principal Problem:   Abscess of left foot Active Problems:   Diabetes mellitus type II, controlled   Cellulitis of left foot  49 yo with NIDDM, HTN, asthma, and history of sarcoidosis, here for 5 day history of increasing left foot pain and swelling.  Left foot cellulitis with abscess in plantar region : s/p I&D in ER. Received a dose of vancomycin and zosyn in the ER. Left foot was warm and considerably swollen. Most likely cellulitis with MRSA abscess as it was purulent drainage when I&D was done. Highly unlikely to be pseudomonas as pt had tight control of diabetes. The wound was more puncture like in appearance and pt is very proactive  about foot care and there was no foot ulcer. Pt's white count was normal. He already had received 2 prior days of doxycycline.  -CBC, BMP -would cultures pending  -Already received one dose of vancomycin, it would be reasonable to resume doxycycline but defer to AM team for choice of antibiotics.  -ESR, CRP   T2DM: last A1c 6.2 and metformin was d/c last month -repeat HbA1c  GERD: stable -protonix  HTN: stable Continue losartan and hctz  HLD: -on atorvastatin  Asthma: On Dulera and singulair   #FEN:  -Diet: HH  #DVT prophylaxis: lovenox  #CODE STATUS: full code      Dispo: Disposition is deferred at this time, awaiting improvement of current medical problems. Anticipated discharge in approximately 1 day(s).   The patient does have a current PCP (Juluis Mire, MD) and does need an [facility] hospital follow-up appointment after discharge.  The patient does not have transportation limitations that hinder transportation to clinic appointments.  Signed: Burgess Estelle, MD 12/13/2014, 11:20 PM

## 2014-12-13 NOTE — ED Provider Notes (Signed)
CSN: [id number]     Arrival date & time 12/13/14  16-Jul-1750 History   First MD Initiated Contact with Patient 12/13/14 2054-07-16     Chief Complaint  Patient presents with  . Foot Pain  . Wound Check     (Consider location/radiation/quality/duration/timing/severity/associated sxs/prior Treatment) Patient is a 49 y.o. male presenting with lower extremity pain and wound check. The history is provided by the patient.  Foot Pain Pertinent negatives include no chest pain, no abdominal pain and no shortness of breath.  Wound Check Pertinent negatives include no chest pain, no abdominal pain and no shortness of breath.  Patient presents w increased pain and swelling to left foot for the past 5 days. Started as small spot/swollen area on plantar aspect left mid foot. Swelling progressed until patient saw podiatrist yesterday who removed a callus, and placed on doxycycline, which pt has taken the past 2 days. Pt notes progressive pain and swelling around lesion on foot, and of entire foot. No numbness/weakness. Pt indicates blood sugar has been high in past but denies hx diabetes. No trauma to foot. Subjective fever last PM.      Past Medical History  Diagnosis Date  . Sarcoidosis   . Esophageal reflux   . Unspecified essential hypertension   . Tear of medial cartilage or meniscus of knee, current   . Congenital pes planus   . Abnormality of gait   . Pain in joint, lower leg   . OSA on CPAP   . Sleep apnea     uses cpap  . Allergy     seasonal  . Asthma   . Diabetes mellitus without complication    Past Surgical History  Procedure Laterality Date  . Knee surgery      left knee repair for medical meniscus tear   Family History  Problem Relation Age of Onset  . Heart disease Father     Died 3 years ago due to CHF and refused pacemaker.  . Cancer Mother     breast cancer passed away in 2009-07-15.  . Colon cancer Neg Hx   . Rectal cancer Neg Hx   . Stomach cancer Neg Hx    Social History   Substance Use Topics  . Smoking status: Former Smoker -- 0.20 packs/day for 3 years    Types: Cigarettes    Quit date: 03/21/1993  . Smokeless tobacco: Never Used     Comment: 1ppd x 2 years  . Alcohol Use: No    Review of Systems  Constitutional: Positive for fever.  HENT: Negative for sore throat.   Eyes: Negative for redness.  Respiratory: Negative for shortness of breath.   Cardiovascular: Negative for chest pain.  Gastrointestinal: Negative for vomiting and abdominal pain.  Genitourinary: Negative for flank pain.  Musculoskeletal: Negative for back pain and neck pain.  Skin: Negative for rash.  Neurological: Negative for weakness and numbness.  Hematological: Does not bruise/bleed easily.  Psychiatric/Behavioral: Negative for confusion.      Allergies  Review of patient's allergies indicates no known allergies.  Home Medications   Prior to Admission medications   Medication Sig Start Date End Date Taking? Authorizing Provider  albuterol (PROAIR HFA) 108 (90 BASE) MCG/ACT inhaler Inhale 2 puffs into the lungs every 6 (six) hours as needed for wheezing or shortness of breath. For shortness of breath 04/16/14   [name], MD  aspirin 81 MG tablet Take 1 tablet (81 mg total) by mouth daily. 07/27/12   Ricki Rodriguez  Newt Lukes, MD  atorvastatin (LIPITOR) 40 MG tablet Take 1 tablet (40 mg total) by mouth daily. 04/18/14   Juluis Mire, MD  cetirizine (ZYRTEC) 10 MG tablet Take 1 tablet (10 mg total) by mouth daily. 10/21/13   Juluis Mire, MD  doxycycline (VIBRA-TABS) 100 MG tablet Take 1 tablet (100 mg total) by mouth 2 (two) times daily. 12/12/14   Gardiner Barefoot, DPM  Fluticasone-Salmeterol (ADVAIR DISKUS) 250-50 MCG/DOSE AEPB Inhale 1 puff into the lungs every 12 hours. 04/16/14   [name], MD  hydrochlorothiazide (HYDRODIURIL) 25 MG tablet Take 1 tablet (25 mg total) by mouth daily. 04/18/14   Juluis Mire, MD  losartan (COZAAR) 100 MG tablet Take 1 tablet (100 mg  total) by mouth daily. 11/12/14   Juluis Mire, MD  mometasone (NASONEX) 50 MCG/ACT nasal spray Place 2 sprays into the nose daily. 04/16/14   [name], MD  montelukast (SINGULAIR) 10 MG tablet Take 1 tablet (10 mg total) by mouth at bedtime. 07/11/14   Juluis Mire, MD  pantoprazole sodium (PROTONIX) 40 mg/20 mL PACK Place 20 mLs (40 mg total) into feeding tube daily. 12/11/14   Ejiroghene Arlyce Dice, MD  tadalafil (CIALIS) 10 MG tablet Take 1 tablet (10 mg total) by mouth as needed for erectile dysfunction. 05/09/14 05/09/15  Juluis Mire, MD  traMADol (ULTRAM) 50 MG tablet Take 1 tablet (50 mg total) by mouth every 6 (six) hours as needed. 12/11/14   Ejiroghene Arlyce Dice, MD  Vitamin D, Cholecalciferol, 1000 UNITS TABS Take 1 tablet by mouth daily. 11/03/14   Marjan Rabbani, MD   BP 120/81 mmHg  Pulse 76  Temp(Src) 98.4 F (36.9 C) (Oral)  Resp 18  SpO2 99% Physical Exam  Constitutional: He appears well-developed and well-nourished. No distress.  HENT:  Head: Atraumatic.  Eyes: Conjunctivae are normal.  Neck: Neck supple. No tracheal deviation present.  Cardiovascular: Normal rate and intact distal pulses.   Pulmonary/Chest: Effort normal. No accessory muscle usage. No respiratory distress.  Abdominal: He exhibits no distension.  Musculoskeletal:  Diffuse swelling left foot. On plantar aspect foot, fluctuance, tendern, erythematous area approximately 2 cm diameter w surrounding erythema. Dp/pt 2+. Normal cap refill distally in toes.     Neurological: He is alert.  Skin: Skin is warm and dry. No rash noted. He is not diaphoretic.  Psychiatric: He has a normal mood and affect.  Nursing note and vitals reviewed.   ED Course  Procedures (including critical care time) Labs Review  Results for orders placed or performed during the hospital encounter of 12/13/14  CBC with Differential/Platelet  Result Value Ref Range   WBC 7.5 4.0 - 10.5 K/uL   RBC 4.48 4.22 - 5.81 MIL/uL    Hemoglobin 13.4 13.0 - 17.0 g/dL   HCT 40.7 39.0 - 52.0 %   MCV 90.8 78.0 - 100.0 fL   MCH 29.9 26.0 - 34.0 pg   MCHC 32.9 30.0 - 36.0 g/dL   RDW 13.7 11.5 - 15.5 %   Platelets 312 150 - 400 K/uL   Neutrophils Relative % 47 %   Neutro Abs 3.6 1.7 - 7.7 K/uL   Lymphocytes Relative 43 %   Lymphs Abs 3.2 0.7 - 4.0 K/uL   Monocytes Relative 7 %   Monocytes Absolute 0.5 0.1 - 1.0 K/uL   Eosinophils Relative 2 %   Eosinophils Absolute 0.1 0.0 - 0.7 K/uL   Basophils Relative 1 %   Basophils Absolute 0.0 0.0 - 0.1 K/uL  Basic metabolic panel  Result Value Ref Range   Sodium 143 135 - 145 mmol/L   Potassium 3.6 3.5 - 5.1 mmol/L   Chloride 104 101 - 111 mmol/L   CO2 31 22 - 32 mmol/L   Glucose, Bld 117 (H) 65 - 99 mg/dL   BUN 16 6 - 20 mg/dL   Creatinine, Ser 1.11 0.61 - 1.24 mg/dL   Calcium 9.6 8.9 - 10.3 mg/dL   GFR calc non Af Amer >60 >60 mL/min   GFR calc Af Amer >60 >60 mL/min   Anion gap 8 5 - 15     I have personally reviewed and evaluated these images and lab results as part of my medical decision-making.    MDM   Iv ns. Labs.  INCISION AND DRAINAGE Performed by: Mirna Mires Consent: Verbal consent obtained. Risks and benefits: risks, benefits and alternatives were discussed Type: abscess  Body area: left foot, plantar aspect  Anesthesia: local infiltration  Incision was made with a scalpel.  Local anesthetic: lidocaine 2% w epinephrine  Anesthetic total: 5 ml  Complexity: complex Blunt dissection to break up loculations  Drainage: purulent  Drainage amount: moderate  Patient tolerance: Patient tolerated the procedure well with no immediate complications.  Culture sent   Zosyn iv. vanc iv.   Reviewed nursing notes and prior charts for additional history.   Given foot worse, despite oral abx as outpt, will admit to med service for iv abx, obs.  Recheck pt comfortable, pain controlled, declines pain med.    Lajean Saver, MD 12/13/14  2256

## 2014-12-14 ENCOUNTER — Encounter: Payer: Self-pay | Admitting: Internal Medicine

## 2014-12-14 DIAGNOSIS — L02612 Cutaneous abscess of left foot: Secondary | ICD-10-CM

## 2014-12-14 DIAGNOSIS — B9689 Other specified bacterial agents as the cause of diseases classified elsewhere: Secondary | ICD-10-CM

## 2014-12-14 DIAGNOSIS — L03116 Cellulitis of left lower limb: Secondary | ICD-10-CM | POA: Diagnosis not present

## 2014-12-14 LAB — BASIC METABOLIC PANEL
Anion gap: 6 (ref 5–15)
BUN: 14 mg/dL (ref 6–20)
CHLORIDE: 107 mmol/L (ref 101–111)
CO2: 29 mmol/L (ref 22–32)
Calcium: 9 mg/dL (ref 8.9–10.3)
Creatinine, Ser: 1.12 mg/dL (ref 0.61–1.24)
GFR calc Af Amer: >60 mL/min (ref >60)
GFR calc non Af Amer: >60 mL/min (ref >60)
GLUCOSE: 96 mg/dL (ref 65–99)
POTASSIUM: 3.4 mmol/L — AB (ref 3.5–5.1)
Sodium: 142 mmol/L (ref 135–145)

## 2014-12-14 LAB — SEDIMENTATION RATE: Sed Rate: 11 mm/hr (ref 0–16)

## 2014-12-14 LAB — CBG MONITORING, ED: GLUCOSE-CAPILLARY: 78 mg/dL (ref 65–99)

## 2014-12-14 LAB — HIV ANTIBODY (ROUTINE TESTING W REFLEX): HIV Screen 4th Generation wRfx: NONREACTIVE

## 2014-12-14 LAB — C-REACTIVE PROTEIN: CRP: 0.9 mg/dL (ref <1.0)

## 2014-12-14 MED ORDER — PIPERACILLIN-TAZOBACTAM 3.375 G IVPB 30 MIN
3.3750 g | Freq: Once | INTRAVENOUS | Status: AC
Start: 2014-12-14 — End: 2014-12-14
  Administered 2014-12-14: 3.375 g via INTRAVENOUS
  Filled 2014-12-14: qty 50

## 2014-12-14 MED ORDER — ENOXAPARIN SODIUM 40 MG/0.4ML ~~LOC~~ SOLN
40.0000 mg | SUBCUTANEOUS | Status: DC
Start: 2014-12-14 — End: 2014-12-14

## 2014-12-14 MED ORDER — IBUPROFEN 400 MG PO TABS: 400.0000 mg | ORAL_TABLET | Freq: Four times a day (QID) | ORAL | Status: DC | PRN

## 2014-12-14 MED ORDER — ATORVASTATIN CALCIUM 40 MG PO TABS: 40.0000 mg | ORAL_TABLET | Freq: Every day | ORAL | Status: DC

## 2014-12-14 MED ORDER — MONTELUKAST SODIUM 10 MG PO TABS
10.0000 mg | ORAL_TABLET | Freq: Every day | ORAL | Status: DC
Start: 2014-12-14 — End: 2014-12-14

## 2014-12-14 MED ORDER — ASPIRIN 81 MG PO TABS: 81.0000 mg | ORAL_TABLET | Freq: Every day | ORAL | Status: DC

## 2014-12-14 MED ORDER — VANCOMYCIN HCL IN DEXTROSE 1-5 GM/200ML-% IV SOLN
1000.0000 mg | Freq: Two times a day (BID) | INTRAVENOUS | Status: DC
  Administered 2014-12-14: 1000 mg via INTRAVENOUS
  Filled 2014-12-14: qty 200

## 2014-12-14 MED ORDER — PIPERACILLIN-TAZOBACTAM 3.375 G IVPB: 3.3750 g | Freq: Three times a day (TID) | INTRAVENOUS | Status: DC

## 2014-12-14 MED ORDER — LEVOFLOXACIN 750 MG PO TABS: 750.0000 mg | ORAL_TABLET | Freq: Every day | ORAL | Status: DC

## 2014-12-14 MED ORDER — PANTOPRAZOLE SODIUM 40 MG PO TBEC: 40.0000 mg | DELAYED_RELEASE_TABLET | Freq: Every day | ORAL | Status: DC

## 2014-12-14 MED ORDER — VITAMIN D (CHOLECALCIFEROL) 25 MCG (1000 UT) PO TABS
1.0000 | ORAL_TABLET | Freq: Every day | ORAL | Status: DC
Start: 2014-12-14 — End: 2014-12-14

## 2014-12-14 MED ORDER — POTASSIUM CHLORIDE CRYS ER 20 MEQ PO TBCR
40.0000 meq | EXTENDED_RELEASE_TABLET | Freq: Once | ORAL | Status: AC
  Administered 2014-12-14: 40 meq via ORAL
  Filled 2014-12-14: qty 2

## 2014-12-14 MED ORDER — LOSARTAN POTASSIUM 50 MG PO TABS: 100.0000 mg | ORAL_TABLET | Freq: Every day | ORAL | Status: DC

## 2014-12-14 MED ORDER — MOMETASONE FURO-FORMOTEROL FUM 100-5 MCG/ACT IN AERO
2.0000 | INHALATION_SPRAY | Freq: Two times a day (BID) | RESPIRATORY_TRACT | Status: DC
  Filled 2014-12-14: qty 8.8

## 2014-12-14 MED ORDER — OXYCODONE-ACETAMINOPHEN 10-325 MG PO TABS: 1.0000 | ORAL_TABLET | Freq: Four times a day (QID) | ORAL | Status: DC | PRN

## 2014-12-14 MED ORDER — HYDROCHLOROTHIAZIDE 25 MG PO TABS: 25.0000 mg | ORAL_TABLET | Freq: Every day | ORAL | Status: DC

## 2014-12-14 NOTE — Discharge Instructions (Signed)
-  Levaquin 750 mg: take 1 tablet by mouth daily for a total of 10 days. Start tomorrow (12/15/14).  -Doxycyline 100 mg: 1 tablet twice daily. Start tomorrow (12/15/14). Continue taking until finished.   -Percocet 10-325 mg: take 1 tablet by mouth every 6 hours as needed for pain.  -Our Internal Medicine clinic will call you to schedule a follow up appointment with Dr. Naaman Plummer after you are done taking antibiotics.   -Continue to wear the boot given to you by the orthopedic doctors.

## 2014-12-14 NOTE — Progress Notes (Signed)
ANTIBIOTIC CONSULT NOTE - INITIAL  Pharmacy Consult for Vancocin and Zosyn Indication: wound infection  Allergies  Allergen Reactions  . Tramadol Nausea And Vomiting    Patient Measurements: Weight: 82kg  Vital Signs: BP: 128/84 mmHg (09/25 0707) Pulse Rate: 80 (09/25 0707)  Labs:  Recent Labs  12/13/14 2200 12/14/14 0442  WBC 7.5  --   HGB 13.4  --   PLT 312  --   CREATININE 1.11 1.12   Estimated Creatinine Clearance: 80.7 mL/min (by C-G formula based on Cr of 1.12).   Medical History: Past Medical History  Diagnosis Date  . Sarcoidosis   . Esophageal reflux   . Unspecified essential hypertension   . Tear of medial cartilage or meniscus of knee, current   . Congenital pes planus   . Abnormality of gait   . Pain in joint, lower leg   . OSA on CPAP   . Sleep apnea     uses cpap  . Allergy     seasonal  . Asthma   . Diabetes mellitus without complication      Assessment: 49yo male had callous removed yesterday and now c/o increased swelling, redness, and draining, had I&D of abscess in ED, to begin IV ABX for cellulitis; pt reports 2d of doxy tx.  Goal of Therapy:  Vancomycin trough level 10-15 mcg/ml  Plan:  Rec'd vanc 1g and Zosyn 3.375g IV in ED; will continue with vancomycin 1000mg  IV Q12H and Zosyn 3.375g IV Q8H and monitor CBC, Cx, levels prn.  Wynona Neat, PharmD, BCPS  12/14/2014,7:20 AM

## 2014-12-14 NOTE — Discharge Summary (Signed)
Name: Collin Lopez MRN: [id number] DOB: Aug 03, 1965 49 y.o. PCP: Juluis Mire, MD  Date of Admission: 12/13/2014  8:55 PM Date of Discharge: 12/15/2014 Attending Physician: No att. providers found  Discharge Diagnosis: Principal Problem:   Abscess of left foot Active Problems:   Diabetes mellitus type II, controlled   Cellulitis of left foot  Discharge Medications:   Medication List    STOP taking these medications        traMADol 50 MG tablet  Commonly known as:  ULTRAM      TAKE these medications        albuterol 108 (90 BASE) MCG/ACT inhaler  Commonly known as:  PROAIR HFA  Inhale 2 puffs into the lungs every 6 (six) hours as needed for wheezing or shortness of breath. For shortness of breath     aspirin 81 MG tablet  Take 1 tablet (81 mg total) by mouth daily.     atorvastatin 40 MG tablet  Commonly known as:  LIPITOR  Take 1 tablet (40 mg total) by mouth daily.     cetirizine 10 MG tablet  Commonly known as:  ZYRTEC  Take 1 tablet (10 mg total) by mouth daily.     doxycycline 100 MG tablet  Commonly known as:  VIBRA-TABS  Take 1 tablet (100 mg total) by mouth 2 (two) times daily.     Fluticasone-Salmeterol 250-50 MCG/DOSE Aepb  Commonly known as:  ADVAIR DISKUS  Inhale 1 puff into the lungs every 12 hours.     hydrochlorothiazide 25 MG tablet  Commonly known as:  HYDRODIURIL  Take 1 tablet (25 mg total) by mouth daily.     ibuprofen 200 MG tablet  Commonly known as:  ADVIL,MOTRIN  Take 400 mg by mouth every 6 (six) hours as needed for moderate pain.     levofloxacin 750 MG tablet  Commonly known as:  LEVAQUIN  Take 1 tablet (750 mg total) by mouth daily.     losartan 100 MG tablet  Commonly known as:  COZAAR  Take 1 tablet (100 mg total) by mouth daily.     mometasone 50 MCG/ACT nasal spray  Commonly known as:  NASONEX  Place 2 sprays into the nose daily.     montelukast 10 MG tablet  Commonly known as:  SINGULAIR  Take 1 tablet (10  mg total) by mouth at bedtime.     oxyCODONE-acetaminophen 10-325 MG per tablet  Commonly known as:  PERCOCET  Take 1 tablet by mouth every 6 (six) hours as needed for pain.     pantoprazole sodium 40 mg/20 mL Pack  Commonly known as:  PROTONIX  Place 20 mLs (40 mg total) into feeding tube daily.     tadalafil 10 MG tablet  Commonly known as:  CIALIS  Take 1 tablet (10 mg total) by mouth as needed for erectile dysfunction.     Vitamin D (Cholecalciferol) 1000 UNITS Tabs  Take 1 tablet by mouth daily.        Disposition and follow-up:   CollinCollin Lopez was discharged from [facility] in Good condition.  At the hospital follow up visit please address:  1.  Left foot cellulitis with abscess in plantar region   2.  Labs / imaging needed at time of follow-up: None  3.  Pending labs/ test needing follow-up: None  Follow-up Appointments: Follow-up Information    Follow up with Juluis Mire, MD.   Specialty:  Internal Medicine   Why:  The office will call you to schedule an appointment.    Contact information:   [place] [address] [phone]       Discharge Instructions:  -Levaquin 750 mg: take 1 tablet by mouth daily for a total of 10 days. Start tomorrow (12/15/14).  -Doxycyline 100 mg: 1 tablet twice daily. Start tomorrow (12/15/14). Continue taking until finished.   -Percocet 10-325 mg: take 1 tablet by mouth every 6 hours as needed for pain.  -Our Internal Medicine clinic will call you to schedule a follow up appointment with Dr. Naaman Plummer after you are done taking antibiotics.   -Continue to wear the boot given to you by the orthopedic doctors.    Consultations:   None  Procedures Performed:  No results found.  Admission HPI: Collin Lopez is a 49 yo man with history of NIDDM just off of metformin last month due to the excellent A1c of 6.1, HTN, asthma, and sarcoidosis who came in for pain and swelling in the mid-plantar  area of the left foot.  He said that he had a callus there for a long time, but he stepped in on a plastic spike in the shower mat 5 days back,and since then the swelling and pain got worse and said there was a pocket of pus there which the wife wanted to drain it, but they did not do that. He says his pain level was about a 9 out of 10. He saw podiatrist yesterday placed him on doxycycline, but since his swelling and pain increased, they came in the ER for I&D and antibiotics. Last night, the patient had subjective fever, no n/v/ or other symptoms.  In the ER, the site was drained and had purulent material, he received a dose of vanc and zosyn. And we were called to admit for overnight observation.   Hospital Course by problem list: Principal Problem:   Abscess of left foot Active Problems:   Diabetes mellitus type II, controlled   Cellulitis of left foot   Left foot cellulitis with abscess in plantar region : s/p I&D in ER. Most likely cellulitis with MRSA abscess as it was purulent drainage when I&D was done. Pseudomonal coverage was be needed because pt is a diabetic and had a puncture wound. Given IV antibiotics (Vancomycin and Piperacillin-Tazobactam). Patient was afebrile on admission and no leukocytosis. Wound culture did not show any growth in 1 day. ESR and CRP normal. K was 3.4 and repleted. On the day of discharge, patient's pain was well controlled and he did not have any fevers, chills, nausea, or vomiting. Vital signs were stable. Would culture final results are still pending and patient will be informed when they come back. Patient transitioned from IV to oral antibiotics upon discharge. He is to take a 10 day course of Doxycycline 100 mg BID and Levofloxacin 750 mg QD. Follow up appointment with PCP after completion of antibiotic therapy  T2DM: last A1c 6.2 and metformin was d/c last month. Repeat A1c  6.1,Diabetes is well controlled.   GERD: Pantoprazole given  HTN: stable.  Home meds losartan and hctz continued.   HLD: home med atorvastatin continued.   Asthma: Home meds Dulera and singulair continued.   Discharge Vitals:   BP 120/87 mmHg  Pulse 76  Temp(Src) 98.4 F (36.9 C) (Oral)  Resp 17  SpO2 100%  Discharge Labs:  No results found for this or any previous visit (from the past 24 hour(s)).  Signed: Shela Leff, MD 12/15/2014,  11:25 PM    Services Ordered on Discharge: None Equipment Ordered on Discharge: None

## 2014-12-14 NOTE — ED Notes (Signed)
CBG 78. 

## 2014-12-14 NOTE — Progress Notes (Signed)
Subjective: Patient see and examined this AM. States his pain is well controlled and he does not have any fevers, chills, nausea, or vomiting. No other complaints.   Objective: Vital signs in last 24 hours: Filed Vitals:   12/14/14 0430 12/14/14 0707 12/14/14 0800 12/14/14 0900  BP: 111/74 128/84 132/87 119/86  Pulse: 67 80 65 67  Temp:      TempSrc:      Resp: 16 17    SpO2: 97% 100% 100% 100%   Weight change:  No intake or output data in the 24 hours ending 12/14/14 1021  General: A&O, in NAD, resting in bed  HEENT: EOMI Neck: supple, midline trachea,  CV: RRR, normal s1, s2, no m/r/g,  Resp: equal and symmetric breath sounds, no wheezing heard Abdomen: soft, nontender, nondistended Extremities: pulses intact b/l,  Left foot plantar region - mild edema and erythema around the site of abscess. No purulent drainage noted.   Lab Results: Basic Metabolic Panel:  Recent Labs Lab 12/13/14 2200 12/14/14 0442  NA 143 142  K 3.6 3.4*  CL 104 107  CO2 31 29  GLUCOSE 117* 96  BUN 16 14  CREATININE 1.11 1.12  CALCIUM 9.6 9.0   CBC:  Recent Labs Lab 12/13/14 2200  WBC 7.5  NEUTROABS 3.6  HGB 13.4  HCT 40.7  MCV 90.8  PLT 312   CBG:  Recent Labs Lab 12/11/14 0942 12/14/14 0858  GLUCAP 92 78    Micro Results: Recent Results (from the past 240 hour(s))  Wound culture     Status: None (Preliminary result)   Collection Time: 12/13/14 10:00 PM  Result Value Ref Range Status   Specimen Description WOUND LEFT FOOT  Final   Special Requests NONE  Final   Gram Stain PENDING  Incomplete   Culture   Final    NO GROWTH 1 DAY Performed at [facility]    Report Status PENDING  Incomplete   Studies/Results: No results found. Medications: I have reviewed the patient's current medications. Scheduled Meds: . aspirin  81 mg Oral Daily  . atorvastatin  40 mg Oral Daily  . enoxaparin (LOVENOX) injection  40 mg Subcutaneous Q24H  .  hydrochlorothiazide  25 mg Oral Daily  . losartan  100 mg Oral Daily  . mometasone-formoterol  2 puff Inhalation BID  . montelukast  10 mg Oral QHS  . pantoprazole  40 mg Oral Daily  . Vitamin D (Cholecalciferol)  1 tablet Oral Daily   Continuous Infusions: . piperacillin-tazobactam (ZOSYN)  IV    . vancomycin     PRN Meds:.ibuprofen Assessment/Plan: Principal Problem:   Abscess of left foot Active Problems:   Diabetes mellitus type II, controlled   Cellulitis of left foot  49 yo with NIDDM, HTN, asthma, and history of sarcoidosis, here for a 5 day history of increasing left foot pain and swelling.  Left foot cellulitis with abscess in plantar region : s/p I&D in ER. Most likely cellulitis with MRSA abscess as it was purulent drainage when I&D was done. Pseudomonal coverage will be needed because pt is a diabetic and had a puncture wound. Patient was afebrile on admission and does not have an elevated WBC count. Wound culture did not show any growth in 1 day. ESR and CRP normal. K was 3.4 and repleted. Vital signs stable today and patient will be discharged.  -Would culture final result pending. Patient will be informed when the result come back.  -Give one more  dose of IV antibiotics today (Vancomycin and Piperacillin-Tazobactam). Patient is to take a 10 day course of Doxycycline 100 mg BID and Levofloxacin 750 mg QD.  -Oxycodone-Acetaminophen 10-325 1 tab q6 prn pain -Follow up appointment with PCP after completion of antibiotic therapy  T2DM: last A1c 6.2 and metformin was d/c last month -Repeat HbA1c pending. Will make suggestion to PCP to follow up results.   GERD: stable -protonix  HTN: stable Continue losartan and hctz  HLD: -on atorvastatin  Asthma: On Dulera and singulair  #FEN:  -Diet: HH  #DVT prophylaxis: lovenox  #CODE STATUS: full code  Dispo: Disposition is deferred at this time, awaiting improvement of current medical problems.  Anticipated discharge  in approximately 0 day(s).   The patient does have a current PCP (Juluis Mire, MD) and does need an [facility] hospital follow-up appointment after discharge.  The patient does not have transportation limitations that hinder transportation to clinic appointments.  .Services Needed at time of discharge: Y = Yes, Blank = No PT:   OT:   RN:   Equipment:   Other:       Shela Leff, MD 12/14/2014, 10:21 AM

## 2014-12-14 NOTE — Progress Notes (Signed)
Pt does not want to take Dulera MDI, pt states he doesn't need it.

## 2014-12-15 LAB — HEMOGLOBIN A1C
Hgb A1c MFr Bld: 6.7 % — ABNORMAL HIGH (ref 4.8–5.6)
MEAN PLASMA GLUCOSE: 146 mg/dL

## 2014-12-16 ENCOUNTER — Telehealth: Payer: Self-pay | Admitting: Podiatry

## 2014-12-16 LAB — WOUND CULTURE

## 2014-12-16 NOTE — Telephone Encounter (Signed)
[name] called at 4:35pm 12/13/14. He said his foot was developing rings around the area that was worked on yesterday. He says his foot swelling is about the same. He has taken antibiotics for 2 days and d/c tramadol. He is concerned about expansion of the rings. I then told him to go to ER for evaluation. His pain is actually decreasing. Told pt to keep me informed. Finally, he made need an I&D  And the hospital is a better setting for this treatment.

## 2014-12-16 NOTE — Telephone Encounter (Signed)
Called Collin Lopez at 2:30pm on 12/14/14. He said he just returned from the ER. He said after his Friday morning visit the cyst enlarged and he was told to go to the ER by myself. His cyst was drained and he was given antibiotics in the hospital. He was sent home with Levaquin. States that the foot is better and swelling has improved and also commented on bloody drainage that was removed from his foot.

## 2014-12-16 NOTE — Telephone Encounter (Signed)
Entered in error

## 2014-12-31 ENCOUNTER — Ambulatory Visit: Payer: Self-pay | Admitting: Sports Medicine

## 2015-03-27 ENCOUNTER — Telehealth: Payer: Self-pay | Admitting: Internal Medicine

## 2015-03-27 NOTE — Telephone Encounter (Signed)
No msg needed for call. Opened in error.

## 2015-04-17 ENCOUNTER — Ambulatory Visit (INDEPENDENT_AMBULATORY_CARE_PROVIDER_SITE_OTHER): Payer: 59 | Admitting: Internal Medicine

## 2015-04-17 ENCOUNTER — Encounter: Payer: Self-pay | Admitting: Internal Medicine

## 2015-04-17 VITALS — BP 130/80 | HR 84 | Ht 67.0 in | Wt 182.0 lb

## 2015-04-17 DIAGNOSIS — D869 Sarcoidosis, unspecified: Secondary | ICD-10-CM | POA: Diagnosis not present

## 2015-04-17 NOTE — Patient Instructions (Addendum)
Stop advair  Only use your albuterol (ventolin) as a rescue medication to be used if you can't catch your breath by resting or doing a relaxed purse lip breathing pattern.  - The less you use it, the better it will work when you need it. - Ok to use up to 2 puffs  every 4 hours if you must but call for immediate appointment if use goes up over your usual need - Don't leave home without it !!  (think of it like the spare tire for your car)   Please schedule a follow up visit in 3 months but call sooner if needed with pfts on return

## 2015-04-17 NOTE — Progress Notes (Signed)
Subjective:     Patient ID: Collin Lopez, male   DOB: January 15, 1966   MRN: [id number]  HPI   22 yobm quit smoking 1995 previously followed by Dr Joya Gaskins with remote dx sarcoidosis and rhinosinusitis/ ? Asthma.   04/17/2015 1st   office visit/ Wert   Chief Complaint  Patient presents with  . Follow-up    Former Dr. Joya Gaskins patient. Pt states breathing is overall doing well. No new co's today.   original problems cough and sob when dx with sarcoid around 2002  But no longterm need for prednisone in for yearly check up doing well   Not limited by breathing from desired activities  = fast walk fine and treadmill but no elevation  Using advair prn/ does not keep saba on hand  Using nasal steroids also rarely   No obvious day to day or daytime variability or assoc chronic cough or cp or chest tightness, subjective wheeze or overt sinus or hb symptoms. No unusual exp hx or h/o childhood pna/ asthma or knowledge of premature birth.  Sleeping ok without nocturnal  or early am exacerbation  of respiratory  c/o's or need for noct saba. Also denies any obvious fluctuation of symptoms with weather or environmental changes or other aggravating or alleviating factors except as outlined above   Current Medications, Allergies, Complete Past Medical History, Past Surgical History, Family History, and Social History were reviewed in [facility] record.  ROS  The following are not active complaints unless bolded sore throat, dysphagia, dental problems, itching, sneezing,  nasal congestion or excess/ purulent secretions, ear ache,   fever, chills, sweats, unintended wt loss, classically pleuritic or exertional cp, hemoptysis,  orthopnea pnd or leg swelling, presyncope, palpitations, abdominal pain, anorexia, nausea, vomiting, diarrhea  or change in bowel or bladder habits, change in stools or urine, dysuria,hematuria,  rash, arthralgias, visual complaints, headache, numbness, weakness or  ataxia or problems with walking or coordination,  change in mood/affect or memory.            Review of Systems     Objective:   Physical Exam    amb bm nad/ minimal pseudowheeze no advair this week   Wt Readings from Last 3 Encounters:  04/17/15 182 lb (82.555 kg)  12/11/14 180 lb 9.6 oz (81.92 kg)  11/19/14 176 lb (79.833 kg)    Vital signs reviewed   HEENT: nl dentition, turbinates, and oropharynx. Nl external ear canals without cough reflex   NECK :  without JVD/Nodes/TM/ nl carotid upstrokes bilaterally   LUNGS: no acc muscle use,  Nl contour chest which is clear to A and P bilaterally without cough on insp or exp maneuvers   CV:  RRR  no s3 or murmur or increase in P2, no edema   ABD:  soft and nontender with nl inspiratory excursion in the supine position. No bruits or organomegaly, bowel sounds nl  MS:  Nl gait/ ext warm without deformities, calf tenderness, cyanosis or clubbing No obvious joint restrictions   SKIN: warm and dry without lesions    NEURO:  alert, approp, nl sensorium with  no motor deficits     Assessment:

## 2015-04-17 NOTE — Assessment & Plan Note (Addendum)
Dx 2002 Dr Joya Gaskins - spirometry 06/22/09 wnl  A good rule of thumb is that >95% of pts with active sarcoid in any organ will have some plain cxr changes - on the other hand  if there are active pulmonary symptoms the cxr will look much worse than the patient:  No evidence of either scenario here/ strongly doubt active dz though could have unrelated asthma/ prior spirometry rules out copd or airways dz from remote sarcoid  Try off advair and just use saba prn at this point   Return for pfts in 3 months  I had an extended discussion with the patient reviewing all relevant studies completed to date and  lasting 15 to 20 minutes of a 25 minute transition of care office visit    Each maintenance medication was reviewed in detail including most importantly the difference between maintenance and prns and under what circumstances the prns are to be triggered using an action plan format that is not reflected in the computer generated alphabetically organized AVS.    Please see instructions for details which were reviewed in writing and the patient given a copy highlighting the part that I personally wrote and discussed at today's ov.

## 2015-04-29 ENCOUNTER — Telehealth: Payer: Self-pay | Admitting: *Deleted

## 2015-04-29 DIAGNOSIS — I10 Essential (primary) hypertension: Secondary | ICD-10-CM

## 2015-04-29 DIAGNOSIS — K219 Gastro-esophageal reflux disease without esophagitis: Secondary | ICD-10-CM

## 2015-04-29 MED ORDER — PANTOPRAZOLE SODIUM 40 MG PO TBEC: 40.0000 mg | DELAYED_RELEASE_TABLET | Freq: Every day | ORAL | Status: DC

## 2015-04-29 MED ORDER — HYDROCHLOROTHIAZIDE 25 MG PO TABS: 25.0000 mg | ORAL_TABLET | Freq: Every day | ORAL | Status: DC

## 2015-04-29 MED FILL — PANTOPRAZOLE SOD DR 40 MG T: 40 | 90 days supply | Qty: 90 | Fill #0

## 2015-04-29 MED FILL — HYDROCHLOROTHIAZIDE 25 MG T: 25 | 90 days supply | Qty: 90 | Fill #0

## 2015-04-29 NOTE — Telephone Encounter (Signed)
Thanks Ulis Rias -- yes I just signed the orders.   Dr. Naaman Plummer

## 2015-04-29 NOTE — Telephone Encounter (Signed)
Dr. Naaman Plummer,  I refilled Mr. Galambos's Hctz and protonix as he was completely out. His next appt is 05/15/2015.  Please co-sign order, thanks.Despina Hidden Cassady2/8/20179:53 AM

## 2015-05-15 ENCOUNTER — Ambulatory Visit (INDEPENDENT_AMBULATORY_CARE_PROVIDER_SITE_OTHER): Payer: 59 | Admitting: Internal Medicine

## 2015-05-15 ENCOUNTER — Encounter: Payer: Self-pay | Admitting: Internal Medicine

## 2015-05-15 VITALS — BP 130/83 | HR 87 | Temp 98.7°F | Wt 184.4 lb

## 2015-05-15 DIAGNOSIS — E119 Type 2 diabetes mellitus without complications: Secondary | ICD-10-CM

## 2015-05-15 DIAGNOSIS — E785 Hyperlipidemia, unspecified: Secondary | ICD-10-CM | POA: Diagnosis not present

## 2015-05-15 DIAGNOSIS — Z79899 Other long term (current) drug therapy: Secondary | ICD-10-CM | POA: Diagnosis not present

## 2015-05-15 DIAGNOSIS — I10 Essential (primary) hypertension: Secondary | ICD-10-CM | POA: Diagnosis not present

## 2015-05-15 LAB — HM DIABETES EYE EXAM

## 2015-05-15 LAB — POCT GLYCOSYLATED HEMOGLOBIN (HGB A1C): HEMOGLOBIN A1C: 6.3

## 2015-05-15 LAB — GLUCOSE, CAPILLARY: Glucose-Capillary: 103 mg/dL — ABNORMAL HIGH (ref 65–99)

## 2015-05-15 MED ORDER — ATORVASTATIN CALCIUM 40 MG PO TABS: 40.0000 mg | ORAL_TABLET | Freq: Every day | ORAL | Status: DC

## 2015-05-15 MED FILL — ATORVASTATIN 40 MG TABLET: 40 | 90 days supply | Qty: 90 | Fill #0

## 2015-05-15 NOTE — Progress Notes (Signed)
Patient ID: Collin Lopez, male   DOB: Oct 15, 1965, 50 y.o.   MRN: [id number]    Subjective:   Patient ID: Collin Lopez male   DOB: [birth year] 50 y.o.   MRN: [id number]  HPI: [name] is a 50 y.o. [name] is a 50 y.o. very pleasant man with past medical history of pulmonary sarcoidosis, hypertension, hyperlipidemia, non-insulin dependent Type 2 DM, allergic rhinitis, GERD, and OSA who presents for follow-up of diabetes.   His last A1c was 6.7 on 12/14/14. He is no longer on metformin. He does not monitor his blood glucose. He has chronic polyuria (in setting of diuretic use) but denies symptomatic hypoglycemia, polydipsia, polyphagia, blurry vision, neuropathy, or foot injury/ulcer. He follows a healthy diet and exercises regularly.He has gained 8 lb since last visit 6 months ago.   He is compliant with taking lipitor for hyperlipidemia. He denies myalgias or myositis.   He is compliant with taking losartan and HCTZ for hypertension. He denies headache, chest pain, LE edema, or lightheadedness.    Past Medical History  Diagnosis Date  . Sarcoidosis ([place])   . Esophageal reflux   . Unspecified essential hypertension   . Tear of medial cartilage or meniscus of knee, current   . Congenital pes planus   . Abnormality of gait   . Pain in joint, lower leg   . OSA on CPAP   . Sleep apnea     uses cpap  . Allergy     seasonal  . Asthma   . Diabetes mellitus without complication [facility])    Current Outpatient Prescriptions  Medication Sig Dispense Refill  . albuterol (PROAIR HFA) 108 (90 BASE) MCG/ACT inhaler Inhale 2 puffs into the lungs every 6 (six) hours as needed for wheezing or shortness of breath. For shortness of breath 1 Inhaler 11  . aspirin 81 MG tablet Take 1 tablet (81 mg total) by mouth daily. 30 tablet   . atorvastatin (LIPITOR) 40 MG tablet Take 1 tablet (40 mg total) by mouth daily. 90 tablet 3  . cetirizine (ZYRTEC) 10 MG tablet Take 1  tablet (10 mg total) by mouth daily. 90 tablet 3  . hydrochlorothiazide (HYDRODIURIL) 25 MG tablet Take 1 tablet (25 mg total) by mouth daily. 90 tablet 0  . ibuprofen (ADVIL,MOTRIN) 200 MG tablet Take 400 mg by mouth every 6 (six) hours as needed for moderate pain.    Marland Kitchen losartan (COZAAR) 100 MG tablet Take 1 tablet (100 mg total) by mouth daily. 90 tablet 3  . mometasone (NASONEX) 50 MCG/ACT nasal spray Place 2 sprays into the nose daily. 17 g 6  . montelukast (SINGULAIR) 10 MG tablet Take 1 tablet (10 mg total) by mouth at bedtime. 30 tablet 5  . oxyCODONE-acetaminophen (PERCOCET) 10-325 MG per tablet Take 1 tablet by mouth every 6 (six) hours as needed for pain. 20 tablet 0  . pantoprazole (PROTONIX) 40 MG tablet Take 1 tablet (40 mg total) by mouth daily. 90 tablet 0  . tadalafil (CIALIS) 10 MG tablet Take 1 tablet (10 mg total) by mouth as needed for erectile dysfunction. 20 tablet 1  . Vitamin D, Cholecalciferol, 1000 UNITS TABS Take 1 tablet by mouth daily. 90 tablet 3   No current facility-administered medications for this visit.   Family History  Problem Relation Age of Onset  . Heart disease Father     Died 3 years ago due to CHF and refused pacemaker.  . Cancer Mother  breast cancer passed away in 2011.  . Colon cancer Neg Hx   . Rectal cancer Neg Hx   . Stomach cancer Neg Hx    Social History   Social History  . Marital Status: Married    Spouse Name: Emrah Corriea  . Number of Children: N/A  . Years of Education: 10   Occupational History  . Environmental services   .  [employer]   Social History Main Topics  . Smoking status: Former Smoker -- 0.20 packs/day for 3 years    Types: Cigarettes    Quit date: 03/21/1993  . Smokeless tobacco: Never Used     Comment: 1ppd x 2 years  . Alcohol Use: No  . Drug Use: No  . Sexual Activity:    Partners: Female   Other Topics Concern  . 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         Review of Systems: Review of Systems  Constitutional: Negative for fever and chills.       Weight gain  Eyes: Negative for blurred vision.  Respiratory: Negative for cough, shortness of breath and wheezing.   Cardiovascular: Negative for chest pain and leg swelling.  Gastrointestinal: Negative for nausea, vomiting, abdominal pain, diarrhea and constipation.  Genitourinary: Negative for dysuria, urgency, frequency and hematuria.  Musculoskeletal: Negative for myalgias.  Neurological: Negative for dizziness, sensory change and headaches.  Endo/Heme/Allergies: Positive for environmental allergies.  Psychiatric/Behavioral: Negative for depression.     Objective:  Physical Exam: Filed Vitals:   05/15/15 1406  BP: 130/83  Pulse: 87  Temp: 98.7 F (37.1 C)  TempSrc: Oral  Weight: 184 lb 6.4 oz (83.643 kg)  SpO2: 100%    Physical Exam  Constitutional: He is oriented to person, place, and time. He appears well-developed and well-nourished. No distress.  HENT:  Head: Normocephalic and atraumatic.  Right Ear: External ear normal.  Left Ear: External ear normal.  Nose: Nose normal.  Mouth/Throat: Oropharynx is clear and moist. No oropharyngeal exudate.  Eyes: Conjunctivae and EOM are normal. Pupils are equal, round, and reactive to light. Right eye exhibits no discharge. Left eye exhibits no discharge. No scleral icterus.  Neck: Normal range of motion. Neck supple.  Cardiovascular: Normal rate, regular rhythm and normal heart sounds.   Pulmonary/Chest: Effort normal and breath sounds normal. No respiratory distress. He has no wheezes. He has no rales.  Abdominal: Soft. Bowel sounds are normal. He exhibits no distension. There is no tenderness. There is no rebound and no guarding.  Musculoskeletal: Normal range of motion. He exhibits no edema or tenderness.  Neurological: He is alert and oriented to person, place, and time.  Skin: Skin is warm and dry. No rash noted.  He is not diaphoretic. No erythema. No pallor.  Psychiatric: He has a normal mood and affect. His behavior is normal. Judgment and thought content normal.    Assessment & Plan:   Please see problem list for problem-based assessment and plan

## 2015-05-15 NOTE — Assessment & Plan Note (Addendum)
Assessment: Pt with well-controlled hypertension compliant with two-class (diuretic & ARB) anti-hypertensive therapy who presents with blood pressure of 130/83.  Plan:  -BP 130/83 at goal <140/90  -Continue losartan 100 mg daily and HCTZ 25 mg daily  -Obtain CMP

## 2015-05-15 NOTE — Assessment & Plan Note (Addendum)
Assessment: Pt with last lipid panel on 12/27/13 with LDL 67 compliant with high-intensity statin therapy with recommendations to continue therapy due to being in statin benefit group (DM).  Plan:  -Obtain annual lipid panel  -Refill atorvastatin 40 mg daily  -Obtain CMP to assess liver function -Continue to monitor for myalgias    ADDENDUM on 05/16/15: Pt with worsened LDL of 138, will increase atorvastatin from 40 mg daily to 80 mg daily.

## 2015-05-15 NOTE — Assessment & Plan Note (Addendum)
Assessment: Pt with last A1c of 6.7 on 12/14/14 not on medical therapy with no symptomatic hypoglycemia who presents with CBG of 103 and improved A1c of 6.3   Plan:  -A1c 6.3at goal <7, continue lifestyle modification   -BP 130/83at goal <140/90, continue losartan 100 mg daily and HCTZ 25 mg daily  -Obtain annual lipid panel, last LDL 67 at goal <100, refill atorvastatin 40 mg daily  -Perform annual eye and foot exams  -Obtain urine microalbumin in setting of proteinuria, continue losartan 100 mg daily  -BMI 28.87 not at goal <25, continue weight loss -Continue aspirin 81 mg daily for primary CVD prevention

## 2015-05-15 NOTE — Patient Instructions (Signed)
-  Great job on improving your A1c to 6.3 from 6.7 off the metformin. Keep dieting and exercising! -Will call you with the results of your bloodwork -Will check you feet and eyes today -Please come back in 6 months or sooner if you have any problems, glad you are doing so well!  General Instructions:   Thank you for bringing your medicines today. This helps [place] keep you safe from mistakes.   Progress Toward Treatment Goals:  Treatment Goal 04/22/2013  Blood pressure at goal    Self Care Goals & Plans:  Self Care Goal 12/11/2014  Manage my medications take my medicines as prescribed; bring my medications to every visit; refill my medications on time  Monitor my health bring my glucose meter and log to each visit; check my feet daily  Eat healthy foods eat more vegetables; eat foods that are low in salt; eat baked foods instead of fried foods  Be physically active find an activity I enjoy    No flowsheet data found.   Care Management & Community Referrals:  Referral 04/22/2013  Referrals made for care management support diabetes educator

## 2015-05-16 LAB — CMP14 + ANION GAP
ALBUMIN: 4.7 g/dL (ref 3.5–5.5)
ALT: 21 IU/L (ref 0–44)
ANION GAP: 18 mmol/L (ref 10.0–18.0)
AST: 17 IU/L (ref 0–40)
Albumin/Globulin Ratio: 1.9 (ref 1.1–2.5)
Alkaline Phosphatase: 88 IU/L (ref 39–117)
BUN / CREAT RATIO: 16 (ref 9–20)
BUN: 16 mg/dL (ref 6–24)
Bilirubin Total: 0.2 mg/dL (ref 0.0–1.2)
CALCIUM: 9.8 mg/dL (ref 8.7–10.2)
CO2: 24 mmol/L (ref 18–29)
CREATININE: 1.03 mg/dL (ref 0.76–1.27)
Chloride: 101 mmol/L (ref 96–106)
GFR calc Af Amer: 96 mL/min/{1.73_m2} (ref >59)
GFR calc non Af Amer: 83 mL/min/{1.73_m2} (ref >59)
Globulin, Total: 2.5 g/dL (ref 1.5–4.5)
Glucose: 106 mg/dL — ABNORMAL HIGH (ref 65–99)
Potassium: 3.8 mmol/L (ref 3.5–5.2)
Sodium: 143 mmol/L (ref 134–144)
Total Protein: 7.2 g/dL (ref 6.0–8.5)

## 2015-05-16 LAB — MICROALBUMIN / CREATININE URINE RATIO
CREATININE, UR: 188.6 mg/dL
MICROALB/CREAT RATIO: 48.4 mg/g creat — ABNORMAL HIGH (ref 0.0–30.0)
MICROALBUM., U, RANDOM: 91.2 ug/mL

## 2015-05-16 LAB — LIPID PANEL
CHOLESTEROL TOTAL: 208 mg/dL — AB (ref 100–199)
Chol/HDL Ratio: 5.9 ratio units — ABNORMAL HIGH (ref 0.0–5.0)
HDL: 35 mg/dL — ABNORMAL LOW (ref >39)
LDL Calculated: 138 mg/dL — ABNORMAL HIGH (ref 0–99)
Triglycerides: 175 mg/dL — ABNORMAL HIGH (ref 0–149)
VLDL Cholesterol Cal: 35 mg/dL (ref 5–40)

## 2015-05-16 MED ORDER — ATORVASTATIN CALCIUM 80 MG PO TABS: 80.0000 mg | ORAL_TABLET | Freq: Every day | ORAL | Status: DC

## 2015-05-16 NOTE — Addendum Note (Signed)
Addended byJuluis Mire on: 05/16/2015 02:29 PM   Modules accepted: Orders

## 2015-05-18 NOTE — Progress Notes (Signed)
Internal Medicine Clinic Attending  Case discussed with Dr. Rabbani soon after the resident saw the patient.  We reviewed the resident's history and exam and pertinent patient test results.  I agree with the assessment, diagnosis, and plan of care documented in the resident's note.  

## 2015-05-21 ENCOUNTER — Encounter: Payer: Self-pay | Admitting: Dietician

## 2015-06-30 MED FILL — ATORVASTATIN 80 MG TABLET: 80 | 90 days supply | Qty: 90 | Fill #0

## 2015-06-30 MED FILL — LOSARTAN POTASSIUM 100 MG T: 100 | 90 days supply | Qty: 90 | Fill #2

## 2015-07-02 MED FILL — ATOVAQUONE-PROGUANIL 250-10: 250-100 | 25 days supply | Qty: 25 | Fill #0

## 2015-07-02 MED FILL — CIPROFLOXACIN HCL 500 MG TA: 500 | 3 days supply | Qty: 6 | Fill #0

## 2015-07-27 ENCOUNTER — Other Ambulatory Visit: Payer: Self-pay | Admitting: Internal Medicine

## 2015-07-27 MED FILL — PANTOPRAZOLE SOD DR 40 MG T: 40 | 90 days supply | Qty: 90 | Fill #0

## 2015-07-27 MED FILL — HYDROCHLOROTHIAZIDE 25 MG T: 25 | 90 days supply | Qty: 90 | Fill #0

## 2015-07-27 NOTE — Telephone Encounter (Signed)
Pt going out of town- needs to pick up today from [facility] Sending to PCP and attending pool just in case.  Patient is aware he waited to last minute

## 2015-07-31 ENCOUNTER — Ambulatory Visit: Payer: Self-pay | Admitting: Internal Medicine

## 2015-09-15 ENCOUNTER — Encounter: Payer: Self-pay | Admitting: *Deleted

## 2015-10-01 ENCOUNTER — Ambulatory Visit: Payer: Self-pay | Admitting: Internal Medicine

## 2015-10-07 ENCOUNTER — Ambulatory Visit (INDEPENDENT_AMBULATORY_CARE_PROVIDER_SITE_OTHER): Payer: 59 | Admitting: Podiatry

## 2015-10-07 DIAGNOSIS — M779 Enthesopathy, unspecified: Secondary | ICD-10-CM

## 2015-10-07 DIAGNOSIS — L03116 Cellulitis of left lower limb: Secondary | ICD-10-CM

## 2015-10-07 NOTE — Progress Notes (Signed)
Subjective:     Patient ID: Collin Lopez, male   DOB: 1965-06-28, 50 y.o.   MRN: [id number]  HPI patient presents stating that he was concerned about depression of the arch and he had a lesion on the left foot and he had questions concerning this   Review of Systems  All other systems reviewed and are negative.      Objective:   Physical Exam  Constitutional: He is oriented to person, place, and time.  Cardiovascular: Intact distal pulses.   Musculoskeletal: Normal range of motion.  Neurological: He is oriented to person, place, and time.  Skin: Skin is warm.  Nursing note and vitals reviewed.  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 If symptoms were to worsen work and the need to consider other treatments and I did discuss long-term orthotics for him

## 2015-10-15 MED FILL — LOSARTAN POTASSIUM 100 MG T: 100 | 90 days supply | Qty: 90 | Fill #3

## 2015-10-15 MED FILL — ATORVASTATIN 80 MG TABLET: 80 | 90 days supply | Qty: 90 | Fill #1

## 2015-11-06 ENCOUNTER — Encounter: Payer: Self-pay | Admitting: *Deleted

## 2015-11-12 ENCOUNTER — Other Ambulatory Visit: Payer: Self-pay | Admitting: Internal Medicine

## 2015-11-12 DIAGNOSIS — R0600 Dyspnea, unspecified: Secondary | ICD-10-CM

## 2015-11-13 ENCOUNTER — Ambulatory Visit (INDEPENDENT_AMBULATORY_CARE_PROVIDER_SITE_OTHER): Payer: 59 | Admitting: Internal Medicine

## 2015-11-13 ENCOUNTER — Ambulatory Visit (INDEPENDENT_AMBULATORY_CARE_PROVIDER_SITE_OTHER)
Admission: RE | Admit: 2015-11-13 | Discharge: 2015-11-13 | Disposition: A | Discharge status: Home / Self Care | Payer: 59 | Source: Ambulatory Visit | Attending: Internal Medicine | Admitting: Internal Medicine

## 2015-11-13 ENCOUNTER — Encounter: Payer: Self-pay | Admitting: Internal Medicine

## 2015-11-13 ENCOUNTER — Encounter (INDEPENDENT_AMBULATORY_CARE_PROVIDER_SITE_OTHER): Payer: 59 | Admitting: Internal Medicine

## 2015-11-13 VITALS — BP 130/84 | HR 67 | Ht 66.5 in | Wt 190.0 lb

## 2015-11-13 DIAGNOSIS — D869 Sarcoidosis, unspecified: Secondary | ICD-10-CM

## 2015-11-13 DIAGNOSIS — R0600 Dyspnea, unspecified: Secondary | ICD-10-CM | POA: Diagnosis not present

## 2015-11-13 LAB — PULMONARY FUNCTION TEST
DL/VA % pred: 119 %
DL/VA: 5.28 ml/min/mmHg/L
DLCO COR % PRED: 81 %
DLCO UNC: 21.65 ml/min/mmHg
DLCO cor: 22.52 ml/min/mmHg
DLCO unc % pred: 78 %
FEF 25-75 POST: 2.92 L/s
FEF 25-75 Pre: 3.14 L/sec
FEF2575-%Change-Post: -7 %
FEF2575-%PRED-POST: 100 %
FEF2575-%PRED-PRE: 107 %
FEV1-%Change-Post: -2 %
FEV1-%Pred-Post: 87 %
FEV1-%Pred-Pre: 89 %
FEV1-POST: 2.54 L
FEV1-PRE: 2.6 L
FEV1FVC-%CHANGE-POST: 1 %
FEV1FVC-%PRED-PRE: 105 %
FEV6-%CHANGE-POST: -2 %
FEV6-%PRED-PRE: 87 %
FEV6-%Pred-Post: 84 %
FEV6-Post: 2.98 L
FEV6-Pre: 3.07 L
FEV6FVC-%Pred-Post: 103 %
FEV6FVC-%Pred-Pre: 103 %
FVC-%CHANGE-POST: -3 %
FVC-%PRED-POST: 81 %
FVC-%PRED-PRE: 84 %
FVC-POST: 2.98 L
FVC-PRE: 3.09 L
POST FEV6/FVC RATIO: 100 %
PRE FEV1/FVC RATIO: 84 %
Post FEV1/FVC ratio: 85 %
Pre FEV6/FVC Ratio: 100 %

## 2015-11-13 NOTE — Patient Instructions (Signed)
Please remember to go to the   x-ray department downstairs for your tests - we will call you with the results when they are available.   If you are satisfied with your treatment plan,  let your doctor know and he/she can either refill your medications or you can return here when your prescription runs out.     If in any way you are not 100% satisfied,  please tell us.  If 100% better, tell your friends!  Pulmonary follow up is as needed        

## 2015-11-13 NOTE — Progress Notes (Signed)
Subjective:     Patient ID: Collin Lopez, male   DOB: 03/04/1966    MRN: [id number]     Brief patient profile:  15 yobm quit smoking 1995 previously followed by Dr Joya Gaskins with remote dx sarcoidosis and rhinosinusitis/ ? Asthma.    History of Present Illness  04/17/2015 1st   office visit/ Maison Agrusa   Chief Complaint  Patient presents with  . Follow-up    Former Dr. Joya Gaskins patient. Pt states breathing is overall doing well. No new co's today.   original problems cough and sob when dx with sarcoid around 2002  But no longterm need for prednisone in for yearly check up doing well   Not limited by breathing from desired activities  = fast walk fine and treadmill but no elevation  Using advair prn/ does not keep saba on hand  Using nasal steroids also rarely  rec Stop advair Only use your albuterol (ventolin)     11/13/2015  f/u ov/Pearley Millington re: remote sarcoidosis  Only rx is alb and rarely uses  Chief Complaint  Patient presents with  . Follow-up    PFT done today. Breathing is doing well and he denies any new co's today.   Not limited by breathing from desired activities       No obvious day to day or daytime variability or assoc chronic cough or cp or chest tightness, subjective wheeze or overt sinus or hb symptoms. No unusual exp hx or h/o childhood pna/ asthma or knowledge of premature birth.  Sleeping ok without nocturnal  or early am exacerbation  of respiratory  c/o's or need for noct saba. Also denies any obvious fluctuation of symptoms with weather or environmental changes or other aggravating or alleviating factors except as outlined above   Current Medications, Allergies, Complete Past Medical History, Past Surgical History, Family History, and Social History were reviewed in [employer] record.  ROS  The following are not active complaints unless bolded sore throat, dysphagia, dental problems, itching, sneezing,  nasal congestion or excess/ purulent  secretions, ear ache,   fever, chills, sweats, unintended wt loss, classically pleuritic or exertional cp, hemoptysis,  orthopnea pnd or leg swelling, presyncope, palpitations, abdominal pain, anorexia, nausea, vomiting, diarrhea  or change in bowel or bladder habits, change in stools or urine, dysuria,hematuria,  rash, arthralgias, visual complaints, headache, numbness, weakness or ataxia or problems with walking or coordination,  change in mood/affect or memory.               Objective:   Physical Exam    amb bm nad     11/13/2015       190   04/17/15 182 lb (82.555 kg)  12/11/14 180 lb 9.6 oz (81.92 kg)  11/19/14 176 lb (79.833 kg)    Vital signs reviewed   HEENT: nl dentition, turbinates, and oropharynx. Nl external ear canals without cough reflex   NECK :  without JVD/Nodes/TM/ nl carotid upstrokes bilaterally   LUNGS: no acc muscle use,  Nl contour chest which is clear to A and P bilaterally without cough on insp or exp maneuvers   CV:  RRR  no s3 or murmur or increase in P2, no edema   ABD:  soft and nontender with nl inspiratory excursion in the supine position. No bruits or organomegaly, bowel sounds nl  MS:  Nl gait/ ext warm without deformities, calf tenderness, cyanosis or clubbing No obvious joint restrictions   SKIN: warm and dry without lesions  NEURO:  alert, approp, nl sensorium with  no motor deficits   CXR PA and Lateral:   11/13/2015 :    I personally reviewed images and agree with radiology impression as follows:    There is no active cardiopulmonary disease.     Assessment:     Outpatient Encounter Prescriptions as of 11/13/2015  Medication Sig  . albuterol (PROAIR HFA) 108 (90 BASE) MCG/ACT inhaler Inhale 2 puffs into the lungs every 6 (six) hours as needed for wheezing or shortness of breath. For shortness of breath  . aspirin 81 MG tablet Take 1 tablet (81 mg total) by mouth daily.  Marland Kitchen atorvastatin (LIPITOR) 80 MG tablet Take 1 tablet (80  mg total) by mouth daily.  . cetirizine (ZYRTEC) 10 MG tablet Take 1 tablet (10 mg total) by mouth daily.  . hydrochlorothiazide (HYDRODIURIL) 25 MG tablet TAKE 1 TABLET (25 MG TOTAL) BY MOUTH DAILY.  Marland Kitchen ibuprofen (ADVIL,MOTRIN) 200 MG tablet Take 400 mg by mouth every 6 (six) hours as needed for moderate pain. Reported on 10/07/2015  . losartan (COZAAR) 100 MG tablet Take 1 tablet (100 mg total) by mouth daily.  . mometasone (NASONEX) 50 MCG/ACT nasal spray Place 2 sprays into the nose daily. (Patient taking differently: Place 2 sprays into the nose daily as needed. )  . pantoprazole (PROTONIX) 40 MG tablet TAKE 1 TABLET (40 MG TOTAL) BY MOUTH DAILY.  . [DISCONTINUED] montelukast (SINGULAIR) 10 MG tablet Take 1 tablet (10 mg total) by mouth at bedtime. (Patient not taking: Reported on 11/13/2015)  . [DISCONTINUED] tadalafil (CIALIS) 10 MG tablet Take 1 tablet (10 mg total) by mouth as needed for erectile dysfunction. (Patient not taking: Reported on 11/13/2015)  . [DISCONTINUED] Vitamin D, Cholecalciferol, 1000 UNITS TABS Take 1 tablet by mouth daily. (Patient not taking: Reported on 11/13/2015)   No facility-administered encounter medications on file as of 11/13/2015.

## 2015-11-15 ENCOUNTER — Encounter: Payer: Self-pay | Admitting: Internal Medicine

## 2015-11-15 NOTE — Assessment & Plan Note (Addendum)
Dx 2002 Dr Joya Gaskins - spirometry 06/22/09 wnl - PFT's  11/13/2015  wnl > pulmonary follow up is as needed   I had an extended final summary discussion with the patient reviewing all relevant studies completed to date and  lasting 15 to 20 minutes of a 25 minute visit on the following issues:    1) after this many years with no flare of ILD it is very unlikey sarcoid will recur   2) He may have very mild related airways dz but this also seems unlikely to need anything but occ saba s need for any from of maint rx   3) pulmonary f/u can be prn - reviewed policy of seeing pts when they have acute flares either by me or NP rather than using UC or ER if possible   4) Each maintenance medication was reviewed in detail including most importantly the difference between maintenance and as needed and under what circumstances the prns are to be used.  Please see instructions for details which were reviewed in writing and the patient given a copy.

## 2015-11-16 NOTE — Progress Notes (Signed)
Spoke with pt and notified of results per Dr. Wert. Pt verbalized understanding and denied any questions. 

## 2015-11-24 MED FILL — HYDROCHLOROTHIAZIDE 25 MG T: 25 | 90 days supply | Qty: 90 | Fill #1

## 2015-11-24 MED FILL — PANTOPRAZOLE SOD DR 40 MG T: 40 | 90 days supply | Qty: 90 | Fill #1

## 2015-12-16 ENCOUNTER — Encounter: Payer: Self-pay | Admitting: Internal Medicine

## 2015-12-16 ENCOUNTER — Ambulatory Visit (INDEPENDENT_AMBULATORY_CARE_PROVIDER_SITE_OTHER): Payer: 59 | Admitting: Internal Medicine

## 2015-12-16 VITALS — BP 131/81 | HR 87 | Temp 98.1°F | Ht 66.5 in | Wt 192.4 lb

## 2015-12-16 DIAGNOSIS — Z7982 Long term (current) use of aspirin: Secondary | ICD-10-CM

## 2015-12-16 DIAGNOSIS — E559 Vitamin D deficiency, unspecified: Secondary | ICD-10-CM

## 2015-12-16 DIAGNOSIS — I10 Essential (primary) hypertension: Secondary | ICD-10-CM

## 2015-12-16 DIAGNOSIS — Z0000 Encounter for general adult medical examination without abnormal findings: Secondary | ICD-10-CM

## 2015-12-16 DIAGNOSIS — E785 Hyperlipidemia, unspecified: Secondary | ICD-10-CM

## 2015-12-16 DIAGNOSIS — Z79899 Other long term (current) drug therapy: Secondary | ICD-10-CM

## 2015-12-16 DIAGNOSIS — Z87891 Personal history of nicotine dependence: Secondary | ICD-10-CM

## 2015-12-16 DIAGNOSIS — E119 Type 2 diabetes mellitus without complications: Secondary | ICD-10-CM | POA: Diagnosis not present

## 2015-12-16 DIAGNOSIS — Z6830 Body mass index (BMI) 30.0-30.9, adult: Secondary | ICD-10-CM

## 2015-12-16 DIAGNOSIS — Z23 Encounter for immunization: Secondary | ICD-10-CM

## 2015-12-16 LAB — GLUCOSE, CAPILLARY: Glucose-Capillary: 76 mg/dL (ref 65–99)

## 2015-12-16 LAB — POCT GLYCOSYLATED HEMOGLOBIN (HGB A1C): HEMOGLOBIN A1C: 6.5

## 2015-12-16 MED ORDER — LOSARTAN POTASSIUM 100 MG PO TABS: 100.0000 mg | ORAL_TABLET | Freq: Every day | ORAL | 3 refills | Status: DC

## 2015-12-16 MED ORDER — TETANUS-DIPHTH-ACELL PERTUSSIS 5-2.5-18.5 LF-MCG/0.5 IM SUSP
0.5000 mL | Freq: Once | INTRAMUSCULAR | Status: AC
  Administered 2015-12-16: 0.5 mL via INTRAMUSCULAR

## 2015-12-16 NOTE — Assessment & Plan Note (Signed)
On 05/16/15, pt with worsened LDL of 138, and atorvastatin was increaed from 40 mg daily to 80 mg daily. Denies any side effects.  Plan -Re-check lipid panel today -Refill atorvastatin 80 mg daily  -Obtain CMP to assess liver function -Continue to monitor for myalgias

## 2015-12-16 NOTE — Assessment & Plan Note (Addendum)
BP Readings from Last 3 Encounters:  12/16/15 131/81  11/13/15 130/84  05/15/15 130/83    Lab Results  Component Value Date   NA 143 05/15/2015   K 3.8 05/15/2015   CREATININE 1.03 05/15/2015    Assessment: Currently on Losartan 100 mg daily and HCTZ 25 mg daily. Well controlled, 131/81 today. Compliant with medications. Denies any side effects.  Plan: -BP 130/83 at goal <140/90  -Continue losartan 100 mg daily and HCTZ 25 mg daily

## 2015-12-16 NOTE — Assessment & Plan Note (Addendum)
Lab Results  Component Value Date   HGBA1C 6.3 05/15/2015   HGBA1C 6.7 (H) 12/14/2014   HGBA1C 6.1 10/24/2014     Assessment: Pt with last A1c of 6.3 on 05/15/15. Not on medical therapy with no symptomatic hypoglycemia who presents with CBG of 76 and stable A1c of 6.5.  Plan: -A1c 6.5at goal <7, continue lifestyle modification   -BP 131/81at goal <140/90, continue losartan 100 mg daily and HCTZ 25 mg daily  -Annual lipid panel obtained in Februrary, LDL worsened 67 to 138 at goal <100. Atorvastatin increased from 40 mg to 80 mg at that time. Re-check panel today. -annual eye and foot exams done in February -urine microalbumin obtained in February (improved 90>48), continue losartan 100 mg daily  -BMI 30 not at goal <25, continue weight loss -Continue aspirin 81 mg daily for primary CVD prevention

## 2015-12-16 NOTE — Progress Notes (Signed)
   CC: DM and HTN follow up  HPI:  [name] is a 50 y.o. male with a past medical history listed below here today for follow up of his HTN and DM.  For details of today's visit and the status of his chronic medical issues please refer to the assessment and plan.   Past Medical History:  Diagnosis Date  . Abnormality of gait   . Allergy    seasonal  . Asthma   . Congenital pes planus   . Diabetes mellitus without complication ([place])    diagnosed 2015  . Esophageal reflux   . OSA on CPAP   . Pain in joint, lower leg   . Sarcoidosis ([place])   . Sleep apnea    uses cpap  . Tear of medial cartilage or meniscus of knee, current   . Unspecified essential hypertension     Review of Systems:  Review of Systems  Constitutional: Negative for malaise/fatigue and weight loss.  Eyes: Negative for blurred vision.  Respiratory: Negative for shortness of breath.   Neurological: Negative for dizziness and headaches.   Physical Exam:  Vitals:   12/16/15 1431  BP: 131/81  Pulse: 87  Temp: 98.1 F (36.7 C)  TempSrc: Oral  Weight: 192 lb 6.4 oz (87.3 kg)   Constitutional: He is oriented to person, place, and time. He appears well-developed and well-nourished. No distress.  Cardiovascular: Normal rate, regular rhythm and normal heart sounds.   Pulmonary/Chest: Effort normal and breath sounds normal. No respiratory distress. He has no wheezes. He has no rales.  Abdominal: Soft. Bowel sounds are normal. He exhibits no distension. There is no tenderness. Musculoskeletal: Normal range of motion. He exhibits no edema or tenderness.  Neurological: He is alert and oriented to person, place, and time.  Skin: Skin is warm and dry. No rash noted. Psychiatric: He has a normal mood and affect.  Assessment & Plan:   See Encounters Tab for problem based charting.  Patient discussed with Dr. Daryll Drown

## 2015-12-16 NOTE — Assessment & Plan Note (Signed)
Patient already received flu shot this year Due for Tdap, received today

## 2015-12-16 NOTE — Assessment & Plan Note (Addendum)
Pt with vitamin D insufficiency with level of 28.7 on 10/24/14. Reports taking 600 Units PO for 6 months. Has not taken any in past 6 months. No falls or fractures.  Plan -Re-check level today

## 2015-12-16 NOTE — Patient Instructions (Signed)
Thank you for coming in today.  I am checking some blood work today. If there are any problems or we need to change any medications I will give you a call.  Your blood pressure is doing good. Continue your current medications.   You are due for your Eye Exam in February.  I would like to see you back in a year for follow up.

## 2015-12-17 LAB — CMP14 + ANION GAP
A/G RATIO: 1.6 (ref 1.2–2.2)
ALBUMIN: 4.3 g/dL (ref 3.5–5.5)
ALK PHOS: 103 IU/L (ref 39–117)
ALT: 20 IU/L (ref 0–44)
AST: 19 IU/L (ref 0–40)
Anion Gap: 14 mmol/L (ref 10.0–18.0)
BILIRUBIN TOTAL: 0.4 mg/dL (ref 0.0–1.2)
BUN / CREAT RATIO: 12 (ref 9–20)
BUN: 14 mg/dL (ref 6–24)
CHLORIDE: 102 mmol/L (ref 96–106)
CO2: 25 mmol/L (ref 18–29)
Calcium: 9.9 mg/dL (ref 8.7–10.2)
Creatinine, Ser: 1.19 mg/dL (ref 0.76–1.27)
GFR calc non Af Amer: 70 mL/min/{1.73_m2} (ref >59)
GFR, EST AFRICAN AMERICAN: 81 mL/min/{1.73_m2} (ref >59)
GLOBULIN, TOTAL: 2.7 g/dL (ref 1.5–4.5)
Glucose: 77 mg/dL (ref 65–99)
POTASSIUM: 3.9 mmol/L (ref 3.5–5.2)
SODIUM: 141 mmol/L (ref 134–144)
TOTAL PROTEIN: 7 g/dL (ref 6.0–8.5)

## 2015-12-17 LAB — LIPID PANEL
CHOLESTEROL TOTAL: 115 mg/dL (ref 100–199)
Chol/HDL Ratio: 3.7 ratio units (ref 0.0–5.0)
HDL: 31 mg/dL — ABNORMAL LOW (ref >39)
LDL Calculated: 60 mg/dL (ref 0–99)
Triglycerides: 122 mg/dL (ref 0–149)
VLDL Cholesterol Cal: 24 mg/dL (ref 5–40)

## 2015-12-17 LAB — VITAMIN D 25 HYDROXY (VIT D DEFICIENCY, FRACTURES): VIT D 25 HYDROXY: 23.4 ng/mL — AB (ref 30.0–100.0)

## 2015-12-29 NOTE — Progress Notes (Signed)
Internal Medicine Clinic Attending  Case discussed with Dr. Boswell soon after the resident saw the patient.  We reviewed the resident's history and exam and pertinent patient test results.  I agree with the assessment, diagnosis, and plan of care documented in the resident's note. 

## 2016-01-20 ENCOUNTER — Encounter: Payer: Self-pay | Admitting: *Deleted

## 2016-02-08 MED FILL — ATORVASTATIN 80 MG TABLET: 80 | 90 days supply | Qty: 90 | Fill #2

## 2016-02-08 MED FILL — LOSARTAN POTASSIUM 100 MG T: 100 | 90 days supply | Qty: 90 | Fill #0

## 2016-03-17 MED FILL — HYDROCHLOROTHIAZIDE 25 MG T: 25 | 90 days supply | Qty: 90 | Fill #2

## 2016-03-17 MED FILL — PANTOPRAZOLE SOD DR 40 MG T: 40 | 90 days supply | Qty: 90 | Fill #2

## 2016-05-20 ENCOUNTER — Ambulatory Visit (HOSPITAL_COMMUNITY)
Admission: EM | Admit: 2016-05-20 | Discharge: 2016-05-20 | Disposition: A | Discharge status: Home / Self Care | Payer: PRIVATE HEALTH INSURANCE | Attending: Emergency Medicine | Admitting: Emergency Medicine

## 2016-05-20 ENCOUNTER — Encounter (HOSPITAL_COMMUNITY): Payer: Self-pay

## 2016-05-20 DIAGNOSIS — J40 Bronchitis, not specified as acute or chronic: Secondary | ICD-10-CM | POA: Diagnosis not present

## 2016-05-20 MED ORDER — AZITHROMYCIN 250 MG PO TABS: 250.0000 mg | ORAL_TABLET | Freq: Every day | ORAL | 0 refills | Status: DC

## 2016-05-20 MED ORDER — BENZONATATE 100 MG PO CAPS: 100.0000 mg | ORAL_CAPSULE | Freq: Three times a day (TID) | ORAL | 0 refills | Status: DC

## 2016-05-20 NOTE — ED Provider Notes (Signed)
CSN: [id number]     Arrival date & time 05/20/16  1259 History   First MD Initiated Contact with Patient 05/20/16 1334     Chief Complaint  Patient presents with  . URI   (Consider location/radiation/quality/duration/timing/severity/associated sxs/prior Treatment) 51 year old male presents to clinic with a one week history of cough, and congestion. States his symptoms started out as being cold like, with fever, congestion, sore throat, etc. States most of his symptoms have resolved, except for his cough. Describes his dry hacking, productive, with green sputum. He has no shortness of breath, no wheezing, he does have a history of asthma, diabetes has not had to use his albuterol inhaler.   The history is provided by the patient.  URI    Past Medical History:  Diagnosis Date  . Abnormality of gait   . Allergy    seasonal  . Asthma   . Congenital pes planus   . Diabetes mellitus without complication ([place])    diagnosed July 15, 2013  . Esophageal reflux   . OSA on CPAP   . Pain in joint, lower leg   . Sarcoidosis ([place])   . Sleep apnea    uses cpap  . Tear of medial cartilage or meniscus of knee, current   . Unspecified essential hypertension    Past Surgical History:  Procedure Laterality Date  . KNEE SURGERY     left knee repair for medical meniscus tear   Family History  Problem Relation Age of Onset  . Heart disease Father     Died 3 years ago due to CHF and refused pacemaker.  . Cancer Mother     breast cancer passed away in 2009-07-15.  . Colon cancer Neg Hx   . Rectal cancer Neg Hx   . Stomach cancer Neg Hx    Social History  Substance Use Topics  . Smoking status: Former Smoker    Packs/day: 0.20    Years: 3.00    Types: Cigarettes    Quit date: 03/21/1993  . Smokeless tobacco: Never Used     Comment: 1ppd x 2 years  . Alcohol use No    Review of Systems  Reason unable to perform ROS: as covered in HPI.  All other systems reviewed and are negative.   Allergies   Tramadol  Home Medications   Prior to Admission medications   Medication Sig Start Date End Date Taking? Authorizing Provider  albuterol (PROAIR HFA) 108 (90 BASE) MCG/ACT inhaler Inhale 2 puffs into the lungs every 6 (six) hours as needed for wheezing or shortness of breath. For shortness of breath 04/16/14  Yes [name], MD  aspirin 81 MG tablet Take 1 tablet (81 mg total) by mouth daily. 07/27/12  Yes [name], MD  atorvastatin (LIPITOR) 80 MG tablet Take 1 tablet (80 mg total) by mouth daily. 05/16/15  Yes Juluis Mire, MD  cetirizine (ZYRTEC) 10 MG tablet Take 1 tablet (10 mg total) by mouth daily. 10/21/13  Yes Marjan Rabbani, MD  hydrochlorothiazide (HYDRODIURIL) 25 MG tablet TAKE 1 TABLET (25 MG TOTAL) BY MOUTH DAILY. 07/27/15  Yes Juluis Mire, MD  ibuprofen (ADVIL,MOTRIN) 200 MG tablet Take 400 mg by mouth every 6 (six) hours as needed for moderate pain. Reported on 10/07/2015   Yes Historical Provider, MD  losartan (COZAAR) 100 MG tablet Take 1 tablet (100 mg total) by mouth daily. 12/16/15  Yes Maryellen Pile, MD  mometasone (NASONEX) 50 MCG/ACT nasal spray Place 2 sprays into the nose daily. Patient  taking differently: Place 2 sprays into the nose daily as needed.  04/16/14  Yes [name], MD  pantoprazole (PROTONIX) 40 MG tablet TAKE 1 TABLET (40 MG TOTAL) BY MOUTH DAILY. 07/27/15  Yes Marjan Rabbani, MD  azithromycin (ZITHROMAX) 250 MG tablet Take 1 tablet (250 mg total) by mouth daily. Take first 2 tablets together, then 1 every day until finished. 05/20/16   Barnet Glasgow, NP  benzonatate (TESSALON) 100 MG capsule Take 1 capsule (100 mg total) by mouth every 8 (eight) hours. 05/20/16   Barnet Glasgow, NP   Meds Ordered and Administered this Visit  Medications - No data to display  BP 132/96 (BP Location: Right Arm)   Pulse 86   Temp 98.4 F (36.9 C) (Oral)   Resp 20   SpO2 99%  No data found.   Physical Exam  Constitutional: He is oriented to person,  place, and time. He appears well-developed and well-nourished. No distress.  HENT:  Head: Normocephalic and atraumatic.  Right Ear: Tympanic membrane and external ear normal.  Left Ear: Tympanic membrane and external ear normal.  Nose: Nose normal. Right sinus exhibits no maxillary sinus tenderness and no frontal sinus tenderness. Left sinus exhibits no maxillary sinus tenderness and no frontal sinus tenderness.  Mouth/Throat: Uvula is midline and oropharynx is clear and moist. No oropharyngeal exudate.  Eyes: Pupils are equal, round, and reactive to light.  Neck: Normal range of motion. Neck supple. No JVD present.  Cardiovascular: Normal rate and regular rhythm.   Pulmonary/Chest: Effort normal and breath sounds normal. No respiratory distress. He has no wheezes.  Abdominal: Soft. Bowel sounds are normal.  Lymphadenopathy:       Head (right side): Tonsillar adenopathy present. No submental, no submandibular and no preauricular adenopathy present.       Head (left side): Tonsillar adenopathy present. No submental, no submandibular and no preauricular adenopathy present.    He has no cervical adenopathy.  Neurological: He is alert and oriented to person, place, and time.  Skin: Skin is warm and dry. Capillary refill takes less than 2 seconds. No rash noted. He is not diaphoretic. No erythema.  Psychiatric: He has a normal mood and affect.  Nursing note and vitals reviewed.   Urgent Care Course     Procedures (including critical care time)  Labs Review Labs Reviewed - No data to display  Imaging Review No results found.      MDM   1. Bronchitis    I'm treating you for bronchitis. I prescribed azithromycin, take 2 tablets today, then one daily until finished. For cough I prescribed Tessalon, take one tablet every 8 hours as needed. Continue to take over-the-counter Mucinex twice a day with a full glass of water, if her symptoms fail to improve within one week follow up with  your primary care provider or return to clinic as needed.     Barnet Glasgow, NP 05/20/16 1354

## 2016-05-20 NOTE — Discharge Instructions (Signed)
I'm treating you for bronchitis. I prescribed azithromycin, take 2 tablets today, then one daily until finished. For cough I prescribed Tessalon, take one tablet every 8 hours as needed. Continue to take over-the-counter Mucinex twice a day with a full glass of water, if her symptoms fail to improve within one week follow up with your primary care provider or return to clinic as needed.

## 2016-05-20 NOTE — ED Triage Notes (Signed)
For 10 days. Having headache, green mucus, productive cough and chest congestion. But thinks he is getting better. Taking mucinex.

## 2016-05-30 MED FILL — LOSARTAN POTASSIUM 100 MG T: 100 | 90 days supply | Qty: 90 | Fill #1

## 2016-06-06 ENCOUNTER — Other Ambulatory Visit: Payer: Self-pay | Admitting: Internal Medicine

## 2016-06-06 DIAGNOSIS — E785 Hyperlipidemia, unspecified: Secondary | ICD-10-CM

## 2016-06-07 MED FILL — ATORVASTATIN 80 MG TABLET: 80 | 90 days supply | Qty: 90 | Fill #0

## 2016-07-20 MED FILL — HYDROCHLOROTHIAZIDE 25 MG T: 25 | 90 days supply | Qty: 90 | Fill #3

## 2016-07-20 MED FILL — PANTOPRAZOLE SOD DR 40 MG T: 40 | 90 days supply | Qty: 90 | Fill #3

## 2016-09-19 MED FILL — ATORVASTATIN 80 MG TABLET: 80 | 90 days supply | Qty: 90 | Fill #1

## 2016-09-19 MED FILL — LOSARTAN POTASSIUM 100 MG T: 100 | 90 days supply | Qty: 90 | Fill #2

## 2016-11-16 ENCOUNTER — Encounter: Payer: Self-pay | Admitting: Internal Medicine

## 2016-11-16 ENCOUNTER — Ambulatory Visit (INDEPENDENT_AMBULATORY_CARE_PROVIDER_SITE_OTHER): Payer: PRIVATE HEALTH INSURANCE | Admitting: Internal Medicine

## 2016-11-16 VITALS — BP 131/79 | HR 82 | Temp 98.2°F | Ht 66.5 in | Wt 191.9 lb

## 2016-11-16 DIAGNOSIS — I10 Essential (primary) hypertension: Secondary | ICD-10-CM | POA: Diagnosis not present

## 2016-11-16 DIAGNOSIS — E119 Type 2 diabetes mellitus without complications: Secondary | ICD-10-CM

## 2016-11-16 DIAGNOSIS — Z23 Encounter for immunization: Secondary | ICD-10-CM

## 2016-11-16 DIAGNOSIS — R7989 Other specified abnormal findings of blood chemistry: Secondary | ICD-10-CM | POA: Diagnosis not present

## 2016-11-16 DIAGNOSIS — Z87891 Personal history of nicotine dependence: Secondary | ICD-10-CM

## 2016-11-16 DIAGNOSIS — K219 Gastro-esophageal reflux disease without esophagitis: Secondary | ICD-10-CM

## 2016-11-16 DIAGNOSIS — Z0000 Encounter for general adult medical examination without abnormal findings: Secondary | ICD-10-CM

## 2016-11-16 DIAGNOSIS — Z79899 Other long term (current) drug therapy: Secondary | ICD-10-CM

## 2016-11-16 DIAGNOSIS — E559 Vitamin D deficiency, unspecified: Secondary | ICD-10-CM

## 2016-11-16 LAB — GLUCOSE, CAPILLARY: GLUCOSE-CAPILLARY: 102 mg/dL — AB (ref 65–99)

## 2016-11-16 LAB — POCT GLYCOSYLATED HEMOGLOBIN (HGB A1C): Hemoglobin A1C: 6.5

## 2016-11-16 MED ORDER — VITAMIN D 1000 UNITS PO TABS: 1000.0000 [IU] | ORAL_TABLET | Freq: Every day | ORAL | 2 refills | Status: DC

## 2016-11-16 MED ORDER — HYDROCHLOROTHIAZIDE 25 MG PO TABS: 25.0000 mg | ORAL_TABLET | Freq: Every day | ORAL | 3 refills | Status: DC

## 2016-11-16 MED FILL — HYDROCHLOROTHIAZIDE 25 MG T: 25 | 90 days supply | Qty: 90 | Fill #0

## 2016-11-16 NOTE — Patient Instructions (Addendum)
Collin Lopez,   It was great seeing you again today. Your DM is doing great, keep up the good work. Your A1c today was 6.5.    Follow up with me in 1 year

## 2016-11-16 NOTE — Progress Notes (Signed)
   CC: DM follow up  HPI:  Collin Lopez is a 51 y.o. male with a past medical history listed below here today for follow up of his DM.  For details of today's visit and the status of his chronic medical issues please refer to the assessment and plan.   Past Medical History:  Diagnosis Date  . Abnormality of gait   . Allergy    seasonal  . Asthma   . Congenital pes planus   . Diabetes mellitus without complication ([place])    diagnosed 2015  . Esophageal reflux   . OSA on CPAP   . Pain in joint, lower leg   . Sarcoidosis   . Sleep apnea    uses cpap  . Tear of medial cartilage or meniscus of knee, current   . Unspecified essential hypertension    Review of Systems:   No chest pain or shortness of breath  Physical Exam:  Vitals:   11/16/16 1412  BP: 131/79  Pulse: 82  Temp: 98.2 F (36.8 C)  TempSrc: Oral  SpO2: 100%  Weight: 191 lb 14.4 oz (87 kg)  Height: 5' 6.5" (1.689 m)   GENERAL- alert, co-operative, appears as stated age, not in any distress. CARDIAC- RRR, no murmurs, rubs or gallops. RESP- Moving equal volumes of air, and clear to auscultation bilaterally ABDOMEN- Soft, nontender, bowel sounds present. EXTREMITIES- pulse 2+, symmetric, no pedal edema. SKIN- Warm, dry, No rash or lesion. PSYCH- Normal mood and affect, appropriate thought content and speech.   Assessment & Plan:   See Encounters Tab for problem based charting.  Patient discussed with Dr. Lynnae January

## 2016-11-17 ENCOUNTER — Other Ambulatory Visit (INDEPENDENT_AMBULATORY_CARE_PROVIDER_SITE_OTHER): Payer: PRIVATE HEALTH INSURANCE

## 2016-11-17 DIAGNOSIS — E559 Vitamin D deficiency, unspecified: Secondary | ICD-10-CM | POA: Diagnosis not present

## 2016-11-17 DIAGNOSIS — E119 Type 2 diabetes mellitus without complications: Secondary | ICD-10-CM

## 2016-11-17 DIAGNOSIS — R7989 Other specified abnormal findings of blood chemistry: Secondary | ICD-10-CM | POA: Insufficient documentation

## 2016-11-17 LAB — BASIC METABOLIC PANEL
BUN/Creatinine Ratio: 11 (ref 9–20)
BUN: 18 mg/dL (ref 6–24)
CALCIUM: 10.5 mg/dL — AB (ref 8.7–10.2)
CHLORIDE: 101 mmol/L (ref 96–106)
CO2: 31 mmol/L — AB (ref 20–29)
Creatinine, Ser: 1.57 mg/dL — ABNORMAL HIGH (ref 0.76–1.27)
GFR calc Af Amer: 57 mL/min/{1.73_m2} — ABNORMAL LOW (ref >59)
GFR calc non Af Amer: 50 mL/min/{1.73_m2} — ABNORMAL LOW (ref >59)
GLUCOSE: 89 mg/dL (ref 65–99)
POTASSIUM: 3.6 mmol/L (ref 3.5–5.2)
SODIUM: 144 mmol/L (ref 134–144)

## 2016-11-17 LAB — CBC
HEMOGLOBIN: 13.5 g/dL (ref 13.0–17.7)
Hematocrit: 40.2 % (ref 37.5–51.0)
MCH: 29.3 pg (ref 26.6–33.0)
MCHC: 33.6 g/dL (ref 31.5–35.7)
MCV: 87 fL (ref 79–97)
PLATELETS: 341 10*3/uL (ref 150–379)
RBC: 4.61 x10E6/uL (ref 4.14–5.80)
RDW: 14.6 % (ref 12.3–15.4)
WBC: 5.9 10*3/uL (ref 3.4–10.8)

## 2016-11-17 NOTE — Assessment & Plan Note (Signed)
Patient has not been taking any Vit D supplementation. Vit D low at last check. Will re-start today.

## 2016-11-17 NOTE — Assessment & Plan Note (Signed)
Lab Results  Component Value Date   HGBA1C 6.5 11/16/2016   HGBA1C 6.5 12/16/2015   HGBA1C 6.3 05/15/2015    A1c stable at 6.5 today. Patient currently diet controlled. No complaints today. Denies any episodes of hypoglycemia.   Assessment: Well controlled DM  Plan: DM foot exam today Ordered for annual DM eye exam

## 2016-11-17 NOTE — Assessment & Plan Note (Addendum)
BP Readings from Last 3 Encounters:  11/16/16 131/79  05/20/16 132/96  12/16/15 131/81    Lab Results  Component Value Date   NA 144 11/16/2016   K 3.6 11/16/2016   CREATININE 1.57 (H) 11/16/2016   BP today 131/79. Currently on Losartan 100 mg daily and HCTZ 25 mg daily. No complaints. Denies any dizziness/lightehadedness, chest pain, shortness of breath.   Assessment: well controlled HTN  Plan: Continue current medications BMET today, Cr elevated from baseline 1.1-1.2 > 1.57. Will repeat to verify, see elevated Cr problem for details.

## 2016-11-17 NOTE — Assessment & Plan Note (Signed)
Patient with history of GERD. Currently on Protonix 40 mg daily. Reports being out of Protonix for the past week. Denies any GERD symptoms since coming off the Protonix. Reports that he has made lifestyle and dietary changes and has not had any issues with GERD for several years. He also notes losing a significant amount of weight since his GERD was an issue for him.  A/P Will do trial off Protonix and monitor for recurrent symptoms.

## 2016-11-17 NOTE — Assessment & Plan Note (Signed)
Patient received flu shot today 

## 2016-11-17 NOTE — Assessment & Plan Note (Addendum)
BMET today with elevated Cr from previous. Cr has been 1.1-1.2 with GFR > 60 in the past. Today, Cr is 1.57 with GFR 57.  Called and discussed with patient over the phone. He has no urinary symptoms or symptoms of BPH. Will repeat BMET today to verify, check UA and microalbuminuria. Will follow up on results and discuss with patient tomorrow.   Addendum: Cr remains elevated at 1.41 with GFR 65. UA is unremarkable. Microalbuminuria/Cr ratio still pending.  Given his new worsened renal function, will arrange for renal US. If worsened proteinuria when labs come back, can consider d/c hctz and starting Verapamil or Diltiazem for additional potential benefit in reducing proteinuria. Will have patient follow up with me in clinic at my next available appointment in October and reassess renal function at that time. Discussed with patient.

## 2016-11-18 LAB — BASIC METABOLIC PANEL
BUN/Creatinine Ratio: 11 (ref 9–20)
BUN: 15 mg/dL (ref 6–24)
CALCIUM: 10.3 mg/dL — AB (ref 8.7–10.2)
CO2: 25 mmol/L (ref 20–29)
Chloride: 102 mmol/L (ref 96–106)
Creatinine, Ser: 1.41 mg/dL — ABNORMAL HIGH (ref 0.76–1.27)
GFR, EST AFRICAN AMERICAN: 65 mL/min/{1.73_m2} (ref >59)
GFR, EST NON AFRICAN AMERICAN: 56 mL/min/{1.73_m2} — AB (ref >59)
Glucose: 99 mg/dL (ref 65–99)
Potassium: 3.5 mmol/L (ref 3.5–5.2)
Sodium: 143 mmol/L (ref 134–144)

## 2016-11-18 LAB — URINALYSIS, COMPLETE
BILIRUBIN UA: NEGATIVE
Glucose, UA: NEGATIVE
KETONES UA: NEGATIVE
Leukocytes, UA: NEGATIVE
Nitrite, UA: NEGATIVE
RBC, UA: NEGATIVE
Specific Gravity, UA: 1.019 (ref 1.005–1.030)
Urobilinogen, Ur: 0.2 mg/dL (ref 0.2–1.0)
pH, UA: 5 (ref 5.0–7.5)

## 2016-11-18 LAB — MICROSCOPIC EXAMINATION
Bacteria, UA: NONE SEEN
Casts: NONE SEEN /lpf

## 2016-11-18 LAB — MICROALBUMIN / CREATININE URINE RATIO
Creatinine, Urine: 225.5 mg/dL
Microalb/Creat Ratio: 48.9 mg/g creat — ABNORMAL HIGH (ref 0.0–30.0)
Microalbumin, Urine: 110.3 ug/mL

## 2016-11-18 NOTE — Progress Notes (Signed)
Internal Medicine Clinic Attending  Case discussed with Dr. Boswell at the time of the visit.  We reviewed the resident's history and exam and pertinent patient test results.  I agree with the assessment, diagnosis, and plan of care documented in the resident's note.  

## 2016-11-18 NOTE — Addendum Note (Signed)
Addended by: [place] on: 11/18/2016 08:27 AM   Modules accepted: Orders

## 2016-11-28 ENCOUNTER — Ambulatory Visit (HOSPITAL_COMMUNITY): Payer: PRIVATE HEALTH INSURANCE

## 2016-11-30 ENCOUNTER — Ambulatory Visit (HOSPITAL_COMMUNITY)
Admission: RE | Admit: 2016-11-30 | Discharge: 2016-11-30 | Disposition: A | Discharge status: Home / Self Care | Payer: No Typology Code available for payment source | Source: Ambulatory Visit | Attending: Internal Medicine | Admitting: Internal Medicine

## 2016-11-30 DIAGNOSIS — N281 Cyst of kidney, acquired: Secondary | ICD-10-CM | POA: Insufficient documentation

## 2016-11-30 DIAGNOSIS — R7989 Other specified abnormal findings of blood chemistry: Secondary | ICD-10-CM | POA: Diagnosis present

## 2016-12-02 ENCOUNTER — Encounter: Payer: Self-pay | Admitting: Internal Medicine

## 2016-12-05 ENCOUNTER — Other Ambulatory Visit: Payer: Self-pay | Admitting: Internal Medicine

## 2016-12-05 DIAGNOSIS — R7989 Other specified abnormal findings of blood chemistry: Secondary | ICD-10-CM

## 2016-12-09 ENCOUNTER — Encounter: Payer: Self-pay | Admitting: Dietician

## 2016-12-12 ENCOUNTER — Encounter: Payer: Self-pay | Admitting: Dietician

## 2017-02-02 ENCOUNTER — Other Ambulatory Visit: Payer: Self-pay | Admitting: Internal Medicine

## 2017-02-02 DIAGNOSIS — I10 Essential (primary) hypertension: Secondary | ICD-10-CM

## 2017-02-02 MED FILL — ATORVASTATIN 80 MG TABLET: 80 | 90 days supply | Qty: 90 | Fill #2

## 2017-02-03 MED FILL — LOSARTAN POTASSIUM 100 MG T: 100 | 90 days supply | Qty: 90 | Fill #0

## 2017-02-20 IMAGING — DX DG CHEST 2V
2 series · 2 of 2 positions shown · non-contrast
Comparison: PA and lateral chest x-ray December 28, 2013.

CLINICAL DATA: Follow-up sarcoidosis

EXAM:
CHEST  2 VIEW

[chest pa]
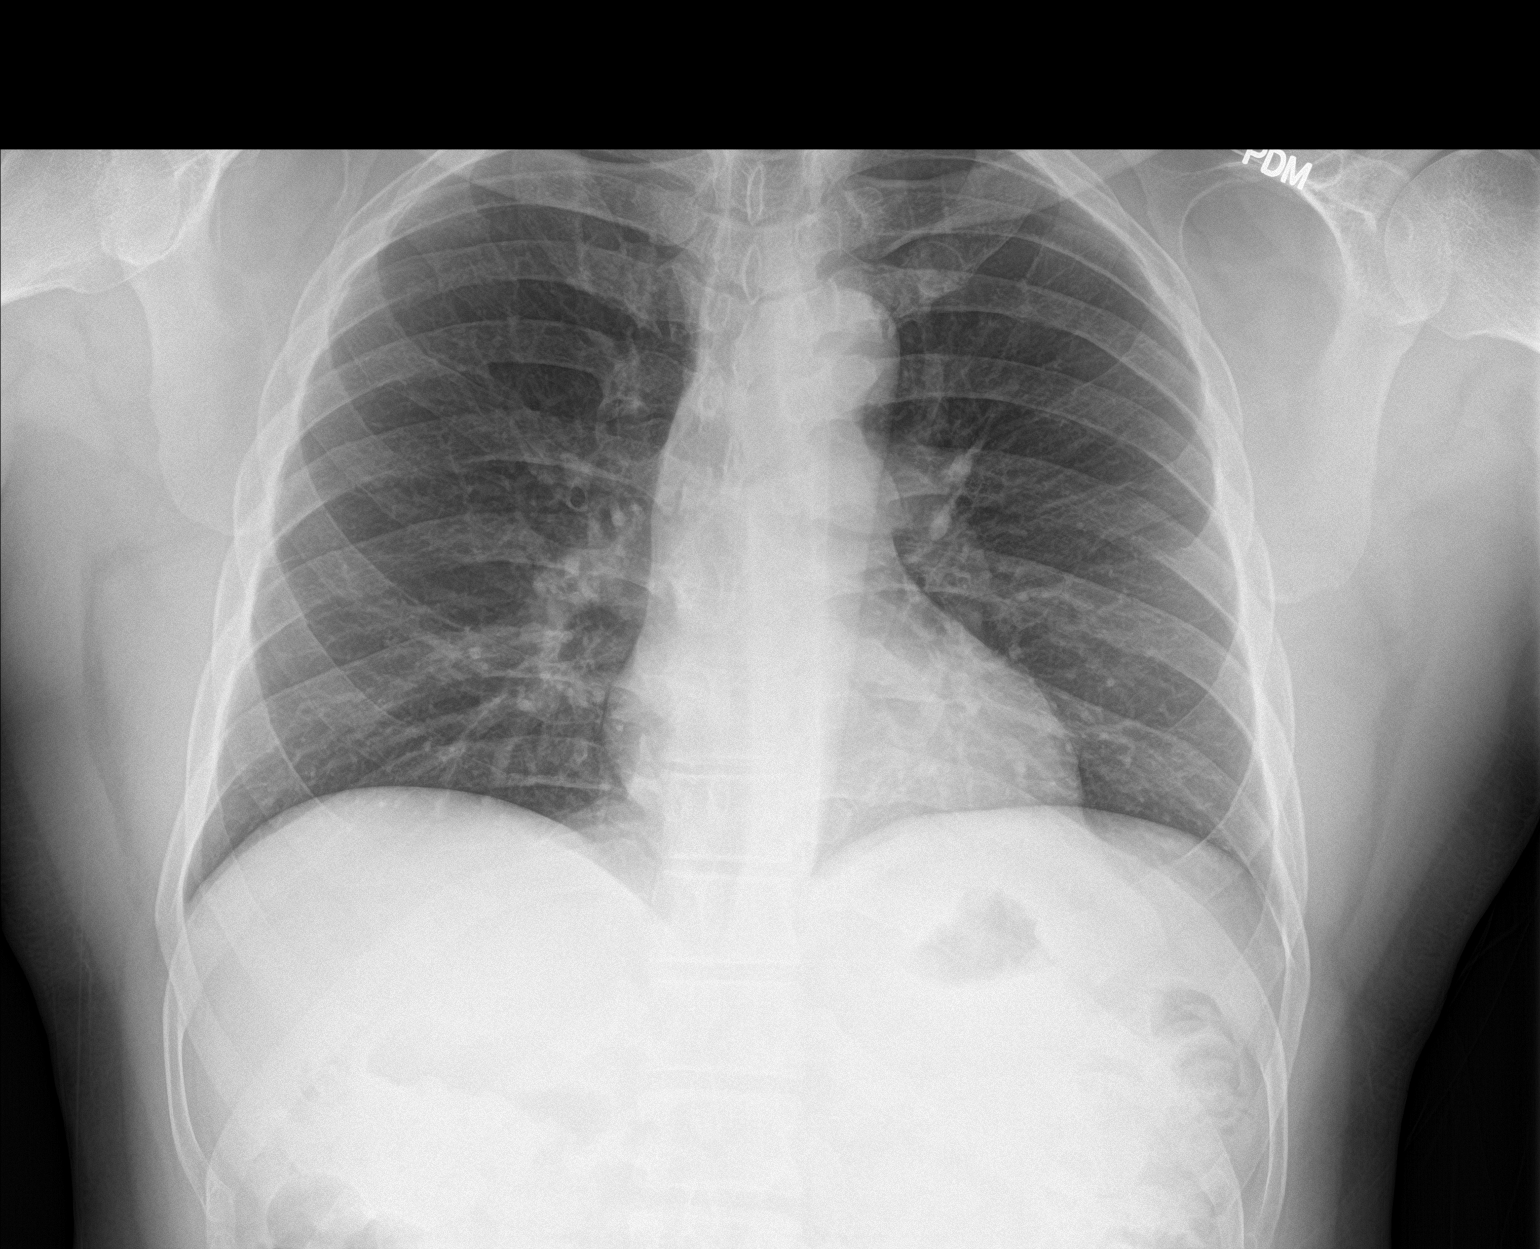

[chest lat]
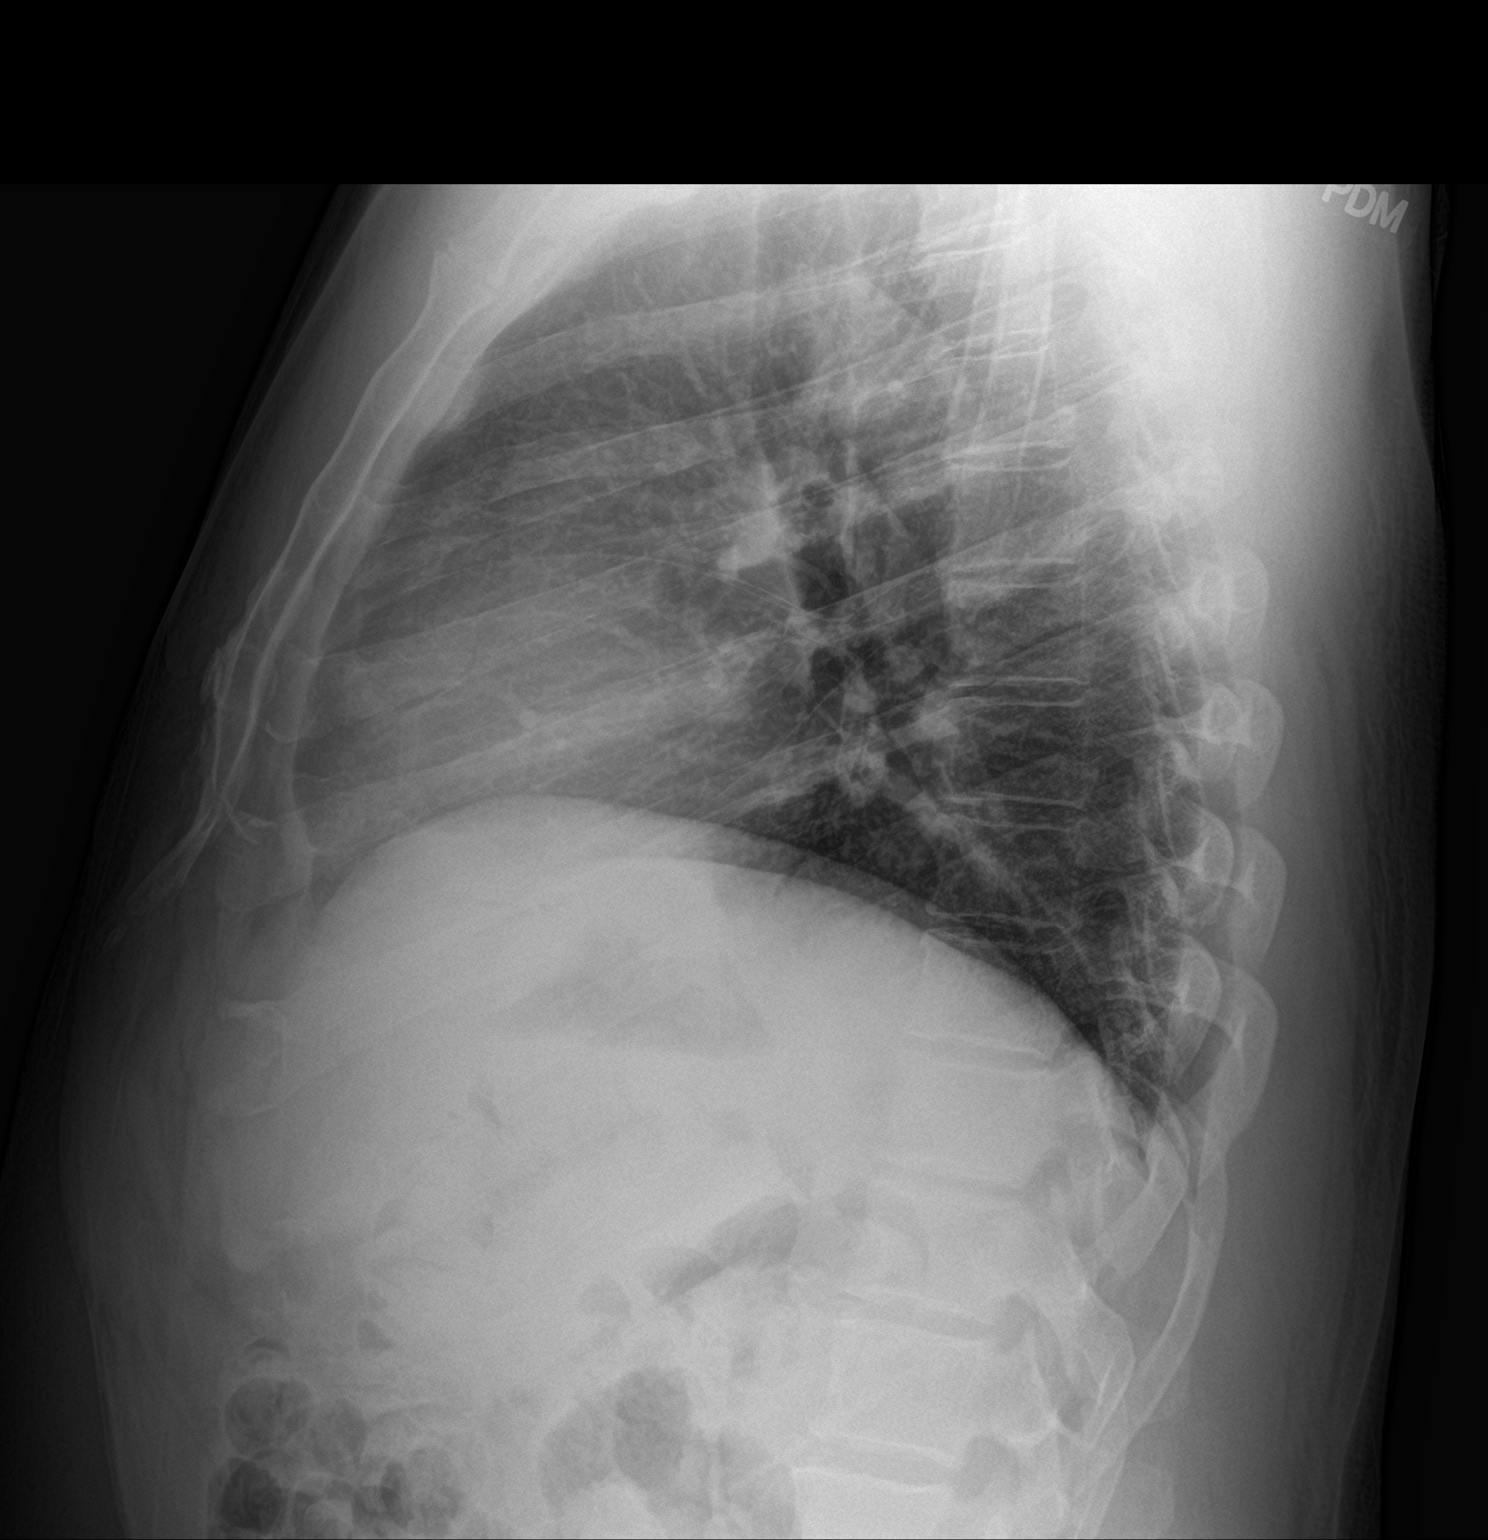

[2 of 2 positions shown; findings below may reference images not displayed]

FINDINGS: The lungs are adequately inflated. There is no focal infiltrate.
There is no pleural effusion. The heart and pulmonary vascularity
are normal. The mediastinum is normal in width. The bony thorax
exhibits no acute abnormality.
IMPRESSION: There is no active cardiopulmonary disease.

## 2017-03-16 LAB — HM DIABETES EYE EXAM

## 2017-03-22 MED FILL — HYDROCHLOROTHIAZIDE 25 MG T: 25 | 90 days supply | Qty: 90 | Fill #1

## 2017-05-01 ENCOUNTER — Encounter: Payer: Self-pay | Admitting: Internal Medicine

## 2017-05-05 NOTE — Telephone Encounter (Signed)
Called pt - no answer; left message to call us back or send another email of his request.

## 2017-05-09 ENCOUNTER — Encounter: Payer: Self-pay | Admitting: *Deleted

## 2017-05-17 ENCOUNTER — Encounter: Payer: Self-pay | Admitting: Internal Medicine

## 2017-05-18 ENCOUNTER — Encounter: Payer: Self-pay | Admitting: Internal Medicine

## 2017-05-18 ENCOUNTER — Ambulatory Visit (INDEPENDENT_AMBULATORY_CARE_PROVIDER_SITE_OTHER): Payer: BLUE CROSS/BLUE SHIELD | Admitting: Internal Medicine

## 2017-05-18 ENCOUNTER — Other Ambulatory Visit: Payer: Self-pay

## 2017-05-18 VITALS — BP 138/84 | HR 78 | Temp 98.2°F | Ht 67.0 in | Wt 193.3 lb

## 2017-05-18 DIAGNOSIS — E559 Vitamin D deficiency, unspecified: Secondary | ICD-10-CM

## 2017-05-18 DIAGNOSIS — M25551 Pain in right hip: Secondary | ICD-10-CM

## 2017-05-18 DIAGNOSIS — M545 Low back pain: Secondary | ICD-10-CM

## 2017-05-18 DIAGNOSIS — I10 Essential (primary) hypertension: Secondary | ICD-10-CM | POA: Diagnosis not present

## 2017-05-18 DIAGNOSIS — N179 Acute kidney failure, unspecified: Secondary | ICD-10-CM | POA: Diagnosis not present

## 2017-05-18 DIAGNOSIS — E119 Type 2 diabetes mellitus without complications: Secondary | ICD-10-CM | POA: Diagnosis not present

## 2017-05-18 DIAGNOSIS — K219 Gastro-esophageal reflux disease without esophagitis: Secondary | ICD-10-CM | POA: Diagnosis not present

## 2017-05-18 DIAGNOSIS — M25559 Pain in unspecified hip: Secondary | ICD-10-CM | POA: Insufficient documentation

## 2017-05-18 DIAGNOSIS — R7989 Other specified abnormal findings of blood chemistry: Secondary | ICD-10-CM

## 2017-05-18 DIAGNOSIS — Z87828 Personal history of other (healed) physical injury and trauma: Secondary | ICD-10-CM

## 2017-05-18 DIAGNOSIS — D860 Sarcoidosis of lung: Secondary | ICD-10-CM | POA: Diagnosis not present

## 2017-05-18 MED ORDER — ACETAMINOPHEN 500 MG PO TABS: 1000.0000 mg | ORAL_TABLET | Freq: Three times a day (TID) | ORAL | 0 refills | Status: DC | PRN

## 2017-05-18 NOTE — Assessment & Plan Note (Addendum)
He was noticed to have bump in crt on prior BMP.  He says that he has been eating healthier and drinking more water.  He is due for follow up BMP today. He is not hypertensive or volume overloaded.  - follow up BMP today

## 2017-05-18 NOTE — Assessment & Plan Note (Signed)
Two weeks of right lower back pain radiating down to the lateral thigh then down to the ankle. The pain feels like a pressure. The pain is associated with pain in the right lateral hip. There is no shooting pain or numbness. The pain has improved with recent weight losses. Tiger balm and ibuprofen have helped to improve the pain. He denies recent trauma or lifting but there has been a change to his job requirements and now he has had to walk 10 flights of stairs daily. Hes had this in the past in the setting of injury to the right leg.  Xray of the lumbar spine in 2014 was without evidence of degenerative disease or intervertebral disk space narrowing. On exam the most prominent piece is tenderness to palpation at the greater trochanter, this may be from trochanteric bursitis from the recent increase in activity and compensation is likely causing the other less severe right sided musculoskeletal pains.  - stop NSAIDS because he has had kidney injury  - start tylenol and continue tiger balm  - referral to PT  - return to clinic in 2 weeks for ultrasound exam, cancel the apt if conservative management has worked to improve the pain by that point

## 2017-05-18 NOTE — Patient Instructions (Signed)
Thank you for coming to the clinic today. It was a pleasure to see you.   For your pain use Tylenol and continue using the Tiger balm clear.  Please not use any NSAID medications including ibuprofen until we are sure that your kidneys are doing better.  FOLLOW-UP INSTRUCTIONS When: 2 weeks in the acute care clinic  For: back and hip pain, if your pain has improved with the treatments by this time he should cancel this visit What to bring: all of your medication bottles   Please call our clinic if you have any questions or concerns, we may be able to help and keep you from a long and expensive emergency room wait. Our clinic and after hours phone number is [phone], there is always someone available.

## 2017-05-18 NOTE — Progress Notes (Signed)
   CC: hip pain    HPI:  Collin Lopez is a 52 y.o. with PMH type 2 diabetes, hypertension, GERD, vitamin D insufficiency, pulmonary sarcoidosis who presents for hip pain. Please see the assessment and plans for the status of the patient chronic medical problems.    Past Medical History:  Diagnosis Date  . Abnormality of gait   . Allergy    seasonal  . Asthma   . Congenital pes planus   . Diabetes mellitus without complication ([place])    diagnosed 2015  . Esophageal reflux   . OSA on CPAP   . Pain in joint, lower leg   . Sarcoidosis   . Sleep apnea    uses cpap  . Tear of medial cartilage or meniscus of knee, current   . Unspecified essential hypertension    Review of Systems:  Refer to history of present illness and assessment and plans for pertinent review of systems, all others reviewed and negative  Physical Exam:  Vitals:   05/18/17 1322  BP: 138/84  Pulse: 78  Temp: 98.2 F (36.8 C)  TempSrc: Oral  SpO2: 99%  Weight: 193 lb 4.8 oz (87.7 kg)  Height: 5\' 7"  (1.702 m)   General: Well-appearing, no acute distress MSK: tenderness to palpation of the right greater trochanter, no spinal tenderness, muscle spasm and mild tenderness to palpation of the right lower back paraspinal muscles, right leg raise does not produce pain up to 45 degrees, the right knee is nonswollen, non-warm to touch, and mildly tender over the joint line  Assessment & Plan:   Hip pain  Two weeks of right lower back pain radiating down to the lateral thigh then down to the ankle. The pain feels like a pressure. The pain is associated with pain in the right lateral hip. There is no shooting pain or numbness. The pain has improved with recent weight losses. Tiger balm and ibuprofen have helped to improve the pain. He denies recent trauma or lifting but there has been a change to his job requirements and now he has had to walk 10 flights of stairs daily. Hes had this in the past in the setting of  injury to the right leg.  Xray of the lumbar spine in 2014 was without evidence of degenerative disease or intervertebral disk space narrowing. On exam the most prominent piece is tenderness to palpation at the greater trochanter, this may be from trochanteric bursitis from the recent increase in activity and compensation is likely causing the other less severe right sided musculoskeletal pains.  - stop NSAIDS because he has had kidney injury  - start tylenol and continue tiger balm  - referral to PT  - return to clinic in 2 weeks for ultrasound exam, cancel the apt if conservative management has worked to improve the pain by that point  Acute kidney injury  He was noticed to have bump in crt on prior BMP.  He says that he has been eating healthier and drinking more water.  He is due for follow up BMP today. He is not hypertensive or volume overloaded.  - follow up BMP today   See Encounters Tab for problem based charting.  Patient discussed with Dr. Dareen Piano

## 2017-05-19 LAB — BMP8+ANION GAP
Anion Gap: 17 mmol/L (ref 10.0–18.0)
BUN / CREAT RATIO: 12 (ref 9–20)
BUN: 14 mg/dL (ref 6–24)
CO2: 25 mmol/L (ref 20–29)
CREATININE: 1.21 mg/dL (ref 0.76–1.27)
Calcium: 9.8 mg/dL (ref 8.7–10.2)
Chloride: 101 mmol/L (ref 96–106)
GFR calc Af Amer: 78 mL/min/{1.73_m2} (ref >59)
GFR, EST NON AFRICAN AMERICAN: 67 mL/min/{1.73_m2} (ref >59)
Glucose: 89 mg/dL (ref 65–99)
Potassium: 3.7 mmol/L (ref 3.5–5.2)
Sodium: 143 mmol/L (ref 134–144)

## 2017-05-22 NOTE — Addendum Note (Signed)
Addended by: Truddie Crumble on: 05/22/2017 04:43 PM   Modules accepted: Orders

## 2017-05-22 NOTE — Progress Notes (Signed)
Internal Medicine Clinic Attending  Case discussed with Dr. Blum at the time of the visit.  We reviewed the resident's history and exam and pertinent patient test results.  I agree with the assessment, diagnosis, and plan of care documented in the resident's note. 

## 2017-06-13 ENCOUNTER — Other Ambulatory Visit: Payer: Self-pay | Admitting: Internal Medicine

## 2017-06-13 DIAGNOSIS — E785 Hyperlipidemia, unspecified: Secondary | ICD-10-CM

## 2017-06-13 MED FILL — ATORVASTATIN 80 MG TABLET: 80 | 90 days supply | Qty: 90 | Fill #0

## 2017-06-13 MED FILL — LOSARTAN POTASSIUM 100 MG T: 100 | 90 days supply | Qty: 90 | Fill #1

## 2017-06-21 MED FILL — PENICILLIN VK 500 MG TABLET: 500 | 10 days supply | Qty: 30 | Fill #0

## 2017-06-21 MED FILL — IBUPROFEN 600 MG TABLET: 600 | 4 days supply | Qty: 10 | Fill #0

## 2017-07-26 ENCOUNTER — Encounter (HOSPITAL_COMMUNITY): Payer: Self-pay | Admitting: Emergency Medicine

## 2017-07-26 ENCOUNTER — Ambulatory Visit (HOSPITAL_COMMUNITY)
Admission: EM | Admit: 2017-07-26 | Discharge: 2017-07-26 | Disposition: A | Discharge status: Home / Self Care | Payer: BLUE CROSS/BLUE SHIELD | Attending: Family Medicine | Admitting: Family Medicine

## 2017-07-26 DIAGNOSIS — H9202 Otalgia, left ear: Secondary | ICD-10-CM

## 2017-07-26 MED ORDER — CETIRIZINE HCL 10 MG PO CAPS: 10.0000 mg | ORAL_CAPSULE | Freq: Every day | ORAL | 0 refills | Status: DC

## 2017-07-26 MED ORDER — FLUTICASONE PROPIONATE 50 MCG/ACT NA SUSP: 1.0000 | Freq: Every day | NASAL | 0 refills | Status: DC

## 2017-07-26 MED ORDER — OXYMETAZOLINE HCL 0.05 % NA SOLN: 1.0000 | Freq: Two times a day (BID) | NASAL | 0 refills | Status: AC

## 2017-07-26 MED FILL — 12 HOUR NASAL DECONGESTANT: 0.05 | 15 days supply | Qty: 30 | Fill #0

## 2017-07-26 MED FILL — HYDROCHLOROTHIAZIDE 25 MG T: 25 | 90 days supply | Qty: 90 | Fill #2

## 2017-07-26 MED FILL — FLUTICASONE PROP 50 MCG SPR: 50 | 30 days supply | Qty: 16 | Fill #0

## 2017-07-26 NOTE — ED Provider Notes (Signed)
[facility]    CSN: [id number] Arrival date & time: 07/26/17  1118     History   Chief Complaint Chief Complaint  Patient presents with  . Otalgia    HPI Collin Lopez is a 52 y.o. male history of allergic rhinitis, asthma, OSA presenting today for evaluation of left ear pain.  Patient states that he has had ear pain off and on for the past 10 days.  He denies any drainage.  He does note that his allergies have been worsening recently, taking a allergy pill with some relief, occasionally using a sinus rinse.  Taking Tylenol and ibuprofen.  Does note that he had a tooth pulled on his right lower jaw approximately 1 month ago, but denies any right ear pain.  HPI  Past Medical History:  Diagnosis Date  . Abnormality of gait   . Allergy    seasonal  . Asthma   . Congenital pes planus   . Diabetes mellitus without complication ([place])    diagnosed 2015  . Esophageal reflux   . OSA on CPAP   . Pain in joint, lower leg   . Sarcoidosis   . Sleep apnea    uses cpap  . Tear of medial cartilage or meniscus of knee, current   . Unspecified essential hypertension     Patient Active Problem List   Diagnosis Date Noted  . Hip pain 05/18/2017  . Elevated serum creatinine 11/17/2016  . Vitamin D insufficiency 11/03/2014  . Erectile dysfunction 05/09/2014  . Healthcare maintenance 12/28/2013  . Diabetes mellitus type II, controlled ([place]) 02/22/2013  . Hyperlipidemia 07/30/2012  . Allergic rhinitis 07/27/2012  . Pulmonary Sarcoidosis 06/22/2009  . Essential hypertension 06/22/2009  . GERD 06/22/2009  . Knee pain, chronic 01/15/2009  . PES PLANUS, CONGENITAL 01/15/2009    Past Surgical History:  Procedure Laterality Date  . KNEE SURGERY     left knee repair for medical meniscus tear       Home Medications    Prior to Admission medications   Medication Sig Start Date End Date Taking? Authorizing Provider  acetaminophen (TYLENOL) 500 MG tablet Take 2 tablets  (1,000 mg total) by mouth every 8 (eight) hours as needed. 05/18/17   [name], MD  albuterol (PROAIR HFA) 108 (90 BASE) MCG/ACT inhaler Inhale 2 puffs into the lungs every 6 (six) hours as needed for wheezing or shortness of breath. For shortness of breath 04/16/14   [name], MD  aspirin 81 MG tablet Take 1 tablet (81 mg total) by mouth daily. 07/27/12   [name], MD  atorvastatin (LIPITOR) 80 MG tablet TAKE 1 TABLET BY MOUTH DAILY. 06/13/17   Maryellen Pile, MD  Cetirizine HCl 10 MG CAPS Take 1 capsule (10 mg total) by mouth daily. 07/26/17 08/25/17  Karmella Bouvier C, PA-C  cholecalciferol (VITAMIN D) 1000 units tablet Take 1 tablet (1,000 Units total) by mouth daily. 11/16/16   Maryellen Pile, MD  fluticasone (FLONASE) 50 MCG/ACT nasal spray Place 1-2 sprays into both nostrils daily for 7 days. 07/26/17 08/02/17  Malasia Torain C, PA-C  hydrochlorothiazide (HYDRODIURIL) 25 MG tablet Take 1 tablet (25 mg total) by mouth daily. 11/16/16   Maryellen Pile, MD  ibuprofen (ADVIL,MOTRIN) 200 MG tablet Take 400 mg by mouth every 6 (six) hours as needed for moderate pain. Reported on 10/07/2015    [provider]  losartan (COZAAR) 100 MG tablet TAKE 1 TABLET (100 MG TOTAL) BY MOUTH DAILY. 02/03/17  Maryellen Pile, MD  mometasone (NASONEX) 50 MCG/ACT nasal spray Place 2 sprays into the nose daily. Patient taking differently: Place 2 sprays into the nose daily as needed.  04/16/14   [name], MD  oxymetazoline (AFRIN) 0.05 % nasal spray Place 1 spray into both nostrils 2 (two) times daily. 07/26/17   Sabreen Kitchen, Elesa Hacker, PA-C    Family History Family History  Problem Relation Age of Onset  . Heart disease Father        Died 3 years ago due to CHF and refused pacemaker.  . Cancer Mother        breast cancer passed away in June 24, 2009.  . Colon cancer Neg Hx   . Rectal cancer Neg Hx   . Stomach cancer Neg Hx     Social History Social History   Tobacco Use  . Smoking status:  Former Smoker    Packs/day: 0.20    Years: 3.00    Pack years: 0.60    Types: Cigarettes    Last attempt to quit: 03/21/1993    Years since quitting: 24.3  . Smokeless tobacco: Never Used  . Tobacco comment: 1ppd x 2 years  Substance Use Topics  . Alcohol use: No    Alcohol/week: 0.0 oz  . Drug use: No     Allergies   Tramadol   Review of Systems Review of Systems  Constitutional: Negative for activity change, appetite change, fatigue and fever.  HENT: Positive for congestion, ear pain, facial swelling, rhinorrhea and sore throat. Negative for ear discharge, postnasal drip and sinus pressure.   Eyes: Negative for pain and itching.  Respiratory: Negative for cough and shortness of breath.   Cardiovascular: Negative for chest pain.  Gastrointestinal: Negative for abdominal pain, diarrhea, nausea and vomiting.  Musculoskeletal: Negative for myalgias.  Skin: Negative for rash.  Neurological: Negative for dizziness, light-headedness and headaches.     Physical Exam Triage Vital Signs ED Triage Vitals [07/26/17 1134]  Enc Vitals Group     BP (!) 142/88     Pulse Rate 82     Resp 16     Temp 98.1 F (36.7 C)     Temp Source Oral     SpO2 95 %     Weight      Height      Head Circumference      Peak Flow      Pain Score      Pain Loc      Pain Edu?      Excl. in [place]?    No data found.  Updated Vital Signs BP (!) 142/88 (BP Location: Right Arm)   Pulse 82   Temp 98.1 F (36.7 C) (Oral)   Resp 16   SpO2 95%   Visual Acuity Right Eye Distance:   Left Eye Distance:   Bilateral Distance:    Right Eye Near:   Left Eye Near:    Bilateral Near:     Physical Exam  Constitutional: He appears well-developed and well-nourished.  HENT:  Head: Normocephalic and atraumatic.  Nontender to palpation of external auricle, tragus and mastoid of left ear, mild swelling to area just anterior to ear and inferior.  Mild tenderness to palpation.  Bilateral TMs  nonerythematous, nasal mucosa nonerythematous with swollen turbinates, posterior oropharynx erythematous, mild tonsillar enlargement, no exudate    Eyes: Conjunctivae are normal.  Neck: Neck supple.  No cervical lymphadenopathy  Cardiovascular: Normal rate and regular rhythm.  No murmur heard.  Pulmonary/Chest: Effort normal and breath sounds normal. No respiratory distress.  Abdominal: Soft. There is no tenderness.  Musculoskeletal: He exhibits no edema.  Neurological: He is alert.  Skin: Skin is warm and dry.  Psychiatric: He has a normal mood and affect.  Nursing note and vitals reviewed.    UC Treatments / Results  Labs (all labs ordered are listed, but only abnormal results are displayed) Labs Reviewed - No data to display  EKG None  Radiology No results found.  Procedures Procedures (including critical care time)  Medications Ordered in UC Medications - No data to display  Initial Impression / Assessment and Plan / UC Course  I have reviewed the triage vital signs and the nursing notes.  Pertinent labs & imaging results that were available during my care of the patient were reviewed by me and considered in my medical decision making (see chart for details).     Patient with left ear pain, possible eustachian tube dysfunction versus related to swelling around the ear, does not have neck stiffness or pain.  At this time will treat with Zyrtec, Flonase and Afrin.  Tylenol and ibuprofen.  Continue to monitor.  If symptoms worsening please return, if not follow-up with ENT. Discussed strict return precautions. Patient verbalized understanding and is agreeable with plan.  Final Clinical Impressions(s) / UC Diagnoses   Final diagnoses:  Left ear pain     Discharge Instructions     Continue Tylenol and Ibuprofen Begin daily Zyrtec Flonase nasal spray- 2 sprays each nostril in morning Afrin nasal spray in evening- do not use more than 3 days  Monitor swelling/ear  pain, if worsening please return, if persisting follow up with ENT    ED Prescriptions    Medication Sig Dispense Auth. Provider   Cetirizine HCl 10 MG CAPS Take 1 capsule (10 mg total) by mouth daily. 30 capsule Tesneem Dufrane C, PA-C   fluticasone (FLONASE) 50 MCG/ACT nasal spray Place 1-2 sprays into both nostrils daily for 7 days. 1 g Willella Harding C, PA-C   oxymetazoline (AFRIN) 0.05 % nasal spray Place 1 spray into both nostrils 2 (two) times daily. 30 mL Shan Padgett C, PA-C     Controlled Substance Prescriptions [place] Controlled Substance Registry consulted? Not Applicable   Janith Lima, [place] 07/26/17 1219

## 2017-07-26 NOTE — Discharge Instructions (Addendum)
Continue Tylenol and Ibuprofen Begin daily Zyrtec Flonase nasal spray- 2 sprays each nostril in morning Afrin nasal spray in evening- do not use more than 3 days  Monitor swelling/ear pain, if worsening please return, if persisting follow up with ENT

## 2017-07-26 NOTE — ED Triage Notes (Signed)
Pt sts left ear ache

## 2017-09-17 ENCOUNTER — Encounter: Payer: Self-pay | Admitting: *Deleted

## 2017-10-13 MED FILL — LOSARTAN POTASSIUM 100 MG T: 100 | 90 days supply | Qty: 90 | Fill #2

## 2017-10-13 MED FILL — ATORVASTATIN 80 MG TABLET: 80 | 90 days supply | Qty: 90 | Fill #1

## 2017-12-22 ENCOUNTER — Other Ambulatory Visit: Payer: Self-pay | Admitting: Internal Medicine

## 2017-12-22 DIAGNOSIS — I10 Essential (primary) hypertension: Secondary | ICD-10-CM

## 2017-12-22 MED FILL — HYDROCHLOROTHIAZIDE 25 MG T: 25 | 90 days supply | Qty: 90 | Fill #0

## 2017-12-22 NOTE — Telephone Encounter (Signed)
Next appt scheduled 10/9 with PCP.

## 2017-12-26 NOTE — Progress Notes (Signed)
   CC: HTN  HPI:  Collin Lopez is a 52 y.o. with a PMHx of HTN, GERD, type II DM, vitamind D insufficiency, pulmonary sarcoidosis presenting for his yearly visit. He has been working hard on his diet and to exercise to meet his goal weight of ~170 lb. He wanted to try to cut back on losartan.  Please see the assessment and plans for the status of the patient chronic medical problems.    Past Medical History:  Diagnosis Date  . Abnormality of gait   . Allergy    seasonal  . Asthma   . Congenital pes planus   . Diabetes mellitus without complication ([place])    diagnosed 2015  . Esophageal reflux   . OSA on CPAP   . Pain in joint, lower leg   . Sarcoidosis   . Sleep apnea    uses cpap  . Tear of medial cartilage or meniscus of knee, current   . Unspecified essential hypertension    Review of Systems:   Review of Systems  Constitutional: Negative for chills and fever.  Respiratory: Negative for cough and shortness of breath.   Cardiovascular: Negative for chest pain and leg swelling.  Musculoskeletal: Positive for back pain.  Neurological: Positive for tingling.    Physical Exam:  Vitals:   12/27/17 1522  BP: 135/78  Pulse: 84  Temp: 98.5 F (36.9 C)  TempSrc: Oral  SpO2: 99%  Weight: 184 lb 6.4 oz (83.6 kg)  Height: 5\' 7"  (1.702 m)   Physical Exam  Constitutional: He is oriented to person, place, and time and well-developed, well-nourished, and in no distress.  Cardiovascular: Normal rate, regular rhythm and normal heart sounds.  No murmur heard. Pulmonary/Chest: Effort normal and breath sounds normal. No respiratory distress. He has no wheezes.  Musculoskeletal: He exhibits no edema.  Neurological: He is alert and oriented to person, place, and time.  Skin: Skin is warm and dry.    Assessment & Plan:   See Encounters Tab for problem based charting.  Patient seen with Dr. Eppie Gibson

## 2017-12-27 ENCOUNTER — Ambulatory Visit (INDEPENDENT_AMBULATORY_CARE_PROVIDER_SITE_OTHER): Payer: BLUE CROSS/BLUE SHIELD | Admitting: Internal Medicine

## 2017-12-27 ENCOUNTER — Other Ambulatory Visit: Payer: Self-pay

## 2017-12-27 ENCOUNTER — Encounter: Payer: Self-pay | Admitting: Internal Medicine

## 2017-12-27 VITALS — BP 135/78 | HR 84 | Temp 98.5°F | Ht 67.0 in | Wt 184.4 lb

## 2017-12-27 DIAGNOSIS — E119 Type 2 diabetes mellitus without complications: Secondary | ICD-10-CM | POA: Diagnosis not present

## 2017-12-27 DIAGNOSIS — M5431 Sciatica, right side: Secondary | ICD-10-CM

## 2017-12-27 DIAGNOSIS — I10 Essential (primary) hypertension: Secondary | ICD-10-CM

## 2017-12-27 DIAGNOSIS — D860 Sarcoidosis of lung: Secondary | ICD-10-CM

## 2017-12-27 DIAGNOSIS — K219 Gastro-esophageal reflux disease without esophagitis: Secondary | ICD-10-CM

## 2017-12-27 DIAGNOSIS — M549 Dorsalgia, unspecified: Secondary | ICD-10-CM

## 2017-12-27 DIAGNOSIS — Z23 Encounter for immunization: Secondary | ICD-10-CM | POA: Diagnosis not present

## 2017-12-27 DIAGNOSIS — Z79899 Other long term (current) drug therapy: Secondary | ICD-10-CM

## 2017-12-27 DIAGNOSIS — E559 Vitamin D deficiency, unspecified: Secondary | ICD-10-CM

## 2017-12-27 LAB — POCT GLYCOSYLATED HEMOGLOBIN (HGB A1C): HEMOGLOBIN A1C: 6.2 % — AB (ref 4.0–5.6)

## 2017-12-27 LAB — GLUCOSE, CAPILLARY: Glucose-Capillary: 83 mg/dL (ref 70–99)

## 2017-12-27 MED ORDER — LOSARTAN POTASSIUM 100 MG PO TABS: 50.0000 mg | ORAL_TABLET | Freq: Every day | ORAL | 3 refills | Status: DC

## 2017-12-27 NOTE — Patient Instructions (Addendum)
Collin Lopez,  It was a pleasure meeting you today! We will cut back on your losartan as discussed to 50 mg once a day! We will plan to follow up in 1 year unless you need to be seen sooner! You are doing great with your diet and exercise! Keep up the great work, I have no doubt you will meet your goal weight!    Sciatica Sciatica is pain, numbness, weakness, or tingling along your sciatic nerve. The sciatic nerve starts in the lower back and goes down the back of each leg. Sciatica happens when this nerve is pinched or has pressure put on it. Sciatica usually goes away on its own or with treatment. Sometimes, sciatica may keep coming back (recur). Follow these instructions at home: Medicines  Take over-the-counter and prescription medicines only as told by your doctor.  Do not drive or use heavy machinery while taking prescription pain medicine. Managing pain  If directed, put ice on the affected area. ? Put ice in a plastic bag. ? Place a towel between your skin and the bag. ? Leave the ice on for 20 minutes, 2-3 times a day.  After icing, apply heat to the affected area before you exercise or as often as told by your doctor. Use the heat source that your doctor tells you to use, such as a moist heat pack or a heating pad. ? Place a towel between your skin and the heat source. ? Leave the heat on for 20-30 minutes. ? Remove the heat if your skin turns bright red. This is especially important if you are unable to feel pain, heat, or cold. You may have a greater risk of getting burned. Activity  Return to your normal activities as told by your doctor. Ask your doctor what activities are safe for you. ? Avoid activities that make your sciatica worse.  Take short rests during the day. Rest in a lying or standing position. This is usually better than sitting to rest. ? When you rest for a long time, do some physical activity or stretching between periods of rest. ? Avoid sitting for a  long time without moving. Get up and move around at least one time each hour.  Exercise and stretch regularly, as told by your doctor.  Do not lift anything that is heavier than 10 lb (4.5 kg) while you have symptoms of sciatica. ? Avoid lifting heavy things even when you do not have symptoms. ? Avoid lifting heavy things over and over.  When you lift objects, always lift in a way that is safe for your body. To do this, you should: ? Bend your knees. ? Keep the object close to your body. ? Avoid twisting. General instructions  Use good posture. ? Avoid leaning forward when you are sitting. ? Avoid hunching over when you are standing.  Stay at a healthy weight.  Wear comfortable shoes that support your feet. Avoid wearing high heels.  Avoid sleeping on a mattress that is too soft or too hard. You might have less pain if you sleep on a mattress that is firm enough to support your back.  Keep all follow-up visits as told by your doctor. This is important. Contact a doctor if:  You have pain that: ? Wakes you up when you are sleeping. ? Gets worse when you lie down. ? Is worse than the pain you have had in the past. ? Lasts longer than 4 weeks.  You lose weight for without trying. Get  help right away if:  You cannot control when you pee (urinate) or poop (have a bowel movement).  You have weakness in any of these areas and it gets worse. ? Lower back. ? Lower belly (pelvis). ? Butt (buttocks). ? Legs.  You have redness or swelling of your back.  You have a burning feeling when you pee. This information is not intended to replace advice given to you by your health care provider. Make sure you discuss any questions you have with your health care provider. Document Released: 12/15/2007 Document Revised: 08/13/2015 Document Reviewed: 11/14/2014 Elsevier Interactive Patient Education  [employer].

## 2017-12-27 NOTE — Assessment & Plan Note (Signed)
Patient has worked really hard to improve his diet and exercise. Eats mostly fresh fruits and vegetables. His Hgb A1c today was 6.2.   - well controlled DM  - Foot exam today

## 2017-12-27 NOTE — Assessment & Plan Note (Addendum)
Patient complained of right sided back pain that radiates down his leg. He says it is intermittent in nature and goes away with use of ice on his back. He notices it come on when he over exerts himself. He was informed this sounded like sciatica and to proceed with conservative management but to come in if this gets worse  - continue with conservative management with ice and/or heating pads

## 2017-12-27 NOTE — Assessment & Plan Note (Addendum)
Patient's BP today 135/76. He was interested in cutting back on his losartan because he has lost ~8 pounds over the last 9 months and working on his diet and exercise. His most recent Cr in 02/19 was 1.21. Will reduce his losartan to 50 mg qd.  - continue HCTZ 25 mg qd  - decrease losartan to 50 mg qd

## 2017-12-28 NOTE — Progress Notes (Signed)
Patient ID: Collin Lopez, male   DOB: 02-Apr-1965, 52 y.o.   MRN: [id number]  I saw and evaluated the patient.  I personally confirmed the key portions of Dr. Olevia Perches history and exam and reviewed pertinent patient test results.  The assessment, diagnosis, and plan were formulated together and I agree with the documentation in the resident's note.  He very much wanted to start weaning his medications.  We discussed the importance of continued weight loss if we were likely to successfully wean off his antihypertensives.  We also discussed the newer guidelines for systolic blood pressure so he was aware of the more aggressive targets moving forward.  I know Collin Lopez and have seen him make significant lifestyle changes to improve his health.  I believe he has the knowledge and motivation to successful meet his goals.  Will reassess success of lifestyle modifications in achieving further weight loss and blood pressure control.

## 2017-12-28 NOTE — Addendum Note (Signed)
Addended by: Oval Linsey D on: 12/28/2017 06:00 AM   Modules accepted: Level of Service

## 2018-01-03 ENCOUNTER — Encounter: Payer: Self-pay | Admitting: Internal Medicine

## 2018-01-18 ENCOUNTER — Encounter: Payer: Self-pay | Admitting: Internal Medicine

## 2018-01-18 NOTE — Telephone Encounter (Signed)
Spoke with the patient.  He sch an app with the [facility] on 02/01/2018 @ 9:15am.

## 2018-01-25 ENCOUNTER — Ambulatory Visit: Payer: Self-pay

## 2018-02-01 ENCOUNTER — Ambulatory Visit (INDEPENDENT_AMBULATORY_CARE_PROVIDER_SITE_OTHER): Payer: BLUE CROSS/BLUE SHIELD | Admitting: Internal Medicine

## 2018-02-01 ENCOUNTER — Other Ambulatory Visit: Payer: Self-pay

## 2018-02-01 ENCOUNTER — Encounter: Payer: Self-pay | Admitting: Internal Medicine

## 2018-02-01 DIAGNOSIS — M5416 Radiculopathy, lumbar region: Secondary | ICD-10-CM

## 2018-02-01 DIAGNOSIS — M5441 Lumbago with sciatica, right side: Secondary | ICD-10-CM

## 2018-02-01 DIAGNOSIS — M5431 Sciatica, right side: Secondary | ICD-10-CM

## 2018-02-01 MED ORDER — ACETAMINOPHEN 500 MG PO TABS: 1000.0000 mg | ORAL_TABLET | Freq: Three times a day (TID) | ORAL | 0 refills | Status: AC | PRN

## 2018-02-01 NOTE — Progress Notes (Signed)
   CC: follow up of low back pain with radiculopathy   HPI:  Collin Lopez is a 52 y.o. with PMH as listed below who presents for follow up of low back pain with radiculopathy. Please see the assessment and plans for the status of the patient chronic medical problems.   Past Medical History:  Diagnosis Date  . Abnormality of gait   . Allergy    seasonal  . Asthma   . Congenital pes planus   . Diabetes mellitus without complication ([place])    diagnosed 2015  . Esophageal reflux   . OSA on CPAP   . Pain in joint, lower leg   . Sarcoidosis   . Sleep apnea    uses cpap  . Tear of medial cartilage or meniscus of knee, current   . Unspecified essential hypertension    Review of Systems:  Refer to history of present illness and assessment and plans for pertinent review of systems, all others reviewed and negative  Physical Exam:  Vitals:   02/01/18 0912  BP: 134/82  Pulse: 74  Temp: 98 F (36.7 C)  TempSrc: Oral  SpO2: 100%  Weight: 186 lb 12.8 oz (84.7 kg)  Height: 5\' 7"  (1.702 m)     General: well appearing, no acute distress  MSK: no vertebral tenderness, there is right lumbar paraspinal muscle tenderness, right straight leg positive, right gluteal FABER test positive, strength of quadriceps and achilles is 5/5  Assessment & Plan:   Lower back pain with radiculopathy  Patient is here for lower back pain with radiculopathy follow up. He first described symptoms concerning for sciatic nerve compression in his office visit with his primary care provider one month ago. He was asked to use ice and heating pads for conservative management. He has had some relief with using alternating ice and heat at night, he also bought a capsicum cream which has provided some relief. The pain is in his right lower back, right hip, and right gluteus, and radiates over the anterior lateral aspect of the thigh to his knee. He has used ibuprofen intermittently but is cautious because. He has  been driving more for work and believes this has been related to the worsening of his pain. The pain becomes worse when he bends, it is not made worse by lying down. The pain is worse in the evening. He sleeps with a pillow under his leg. He has had weight loss but says that this has been intentional. The pain is not worsened when he lies down. At this time he does not have history, signs or symptoms consistent with neoplasm, epidural abscess. He does not have bilateral symptoms, urinary retention, saddle anesthesia or progressive/severe motor deficit. He has had some response to the four week trial of this conservative management so we should proceed with physical therapy and continue the conservative approaches he has been using. We also discussed how his goal of weight loss will be beneficial. Presentation concerning for piriformis syndrome with sciatic compression.  - start scheduled tylenol 1000 mg every 6 hours with continued as needed NSAID  - referral to physical therapy   See Encounters Tab for problem based charting.  Patient discussed with Dr. Daryll Drown

## 2018-02-01 NOTE — Assessment & Plan Note (Signed)
Patient is here for lower back pain with radiculopathy follow up. He first described symptoms concerning for sciatic nerve compression in his office visit with his primary care provider one month ago. He was asked to use ice and heating pads for conservative management. He has had some relief with using alternating ice and heat at night, he also bought a capsicum cream which has provided some relief. The pain is in his right lower back, right hip, and right gluteus, and radiates over the anterior lateral aspect of the thigh to his knee. He has used ibuprofen intermittently but is cautious because. He has been driving more for work and believes this has been related to the worsening of his pain. The pain becomes worse when he bends, it is not made worse by lying down. The pain is worse in the evening. He sleeps with a pillow under his leg. He has had weight loss but says that this has been intentional. The pain is not worsened when he lies down. At this time he does not have history, signs or symptoms consistent with neoplasm, epidural abscess. He does not have bilateral symptoms, urinary retention, saddle anesthesia or progressive/severe motor deficit. He has had some response to the four week trial of this conservative management so we should proceed with physical therapy and continue the conservative approaches he has been using. We also discussed how his goal of weight loss will be beneficial. Presentation concerning for piriformis syndrome with sciatic compression.  - start scheduled tylenol 1000 mg every 6 hours with continued as needed NSAID  - referral to physical therapy

## 2018-02-01 NOTE — Patient Instructions (Addendum)
Thank you for coming to the clinic today. It was a pleasure to see you.   For your lower back pain - please start taking tylenol 1000 mg every 6 hours on a scheduled basis for the next one to two weeks. You have been referred for physical therapy, if you are not able to do this work on the stretches that you were provided today.   FOLLOW-UP INSTRUCTIONS When: 1-3 months with Dr. Laural Golden  For: follow up of your back pain  What to bring: all of your medication bottles   Please call the internal medicine center clinic if you have any questions or concerns, we may be able to help and keep you from a long and expensive emergency room wait. Our clinic and after hours phone number is [phone], the best time to call is Monday through Friday 9 am to 4 pm but there is always someone available 24/7 if you have an emergency. If you need medication refills please notify your pharmacy one week in advance and they will send [place] a request.

## 2018-02-06 NOTE — Addendum Note (Signed)
Addended by: Gilles Chiquito B on: 02/06/2018 11:11 AM   Modules accepted: Level of Service

## 2018-02-06 NOTE — Progress Notes (Signed)
Internal Medicine Clinic Attending  Case discussed with Dr. Blum at the time of the visit.  We reviewed the resident's history and exam and pertinent patient test results.  I agree with the assessment, diagnosis, and plan of care documented in the resident's note. 

## 2018-05-02 ENCOUNTER — Encounter: Payer: Self-pay | Admitting: Internal Medicine

## 2018-06-20 ENCOUNTER — Other Ambulatory Visit: Payer: Self-pay

## 2018-06-20 ENCOUNTER — Ambulatory Visit (INDEPENDENT_AMBULATORY_CARE_PROVIDER_SITE_OTHER): Payer: BLUE CROSS/BLUE SHIELD | Admitting: Internal Medicine

## 2018-06-20 DIAGNOSIS — I10 Essential (primary) hypertension: Secondary | ICD-10-CM

## 2018-06-20 DIAGNOSIS — M79642 Pain in left hand: Secondary | ICD-10-CM

## 2018-06-20 DIAGNOSIS — E785 Hyperlipidemia, unspecified: Secondary | ICD-10-CM

## 2018-06-20 DIAGNOSIS — M5431 Sciatica, right side: Secondary | ICD-10-CM

## 2018-06-20 MED ORDER — LOSARTAN POTASSIUM 100 MG PO TABS: 50.0000 mg | ORAL_TABLET | Freq: Every day | ORAL | 3 refills | Status: DC

## 2018-06-20 MED ORDER — ATORVASTATIN CALCIUM 80 MG PO TABS: 80.0000 mg | ORAL_TABLET | Freq: Every day | ORAL | 3 refills | Status: DC

## 2018-06-20 NOTE — Assessment & Plan Note (Signed)
Patient states his lower back pain with right sided radiculopathy has been stable. He was started on tylenol 1000 mg q6h prn during his last visit in 01/2018. He states this has helped his pain. He has not had a chance to start PT and is currently staying at home due to the pandemic. He denies any numbness or tingling, difficulty walking or progressive/severe motor deficits.  Plan: - Continue tylenol 1000 mg q6h prn and start PT when able to after things calm down with COVID

## 2018-06-20 NOTE — Progress Notes (Signed)
  [facility] Telephone Encounter  Reason for call:   This telephone encounter was created for Collin Lopez on 06/20/2018 for the following purpose/cc sciatica and left UE pain.   Pertinent Data:   Left Shoulder xray 2016- no acute bony or joint abnormality. No evidence of fracture or dislocation   Review of Systems  Constitutional: Negative for chills, fever and malaise/fatigue.  Genitourinary: Negative for dysuria, frequency and urgency.  Musculoskeletal: Positive for myalgias. Negative for back pain and neck pain.  Neurological: Negative for tingling and sensory change.    Assessment / Plan / Recommendations:   A- Lower back pain with radiculopathy: Patient states his lower back pain with right sided radiculopathy has been stable. He was started on tylenol 1000 mg q6h prn during his last visit in 01/2018. He states this has helped his pain. He has not had a chance to start PT and is currently staying at home due to the pandemic. He denies any numbness or tingling, difficulty walking or progressive/severe motor deficits.  Plan- Continue tylenol 1000 mg q6h prn and start PT when able to after things calm down with COVID   A-Left Upper Extremity Pain: Patient states over the last few months he has noticed a burning pain in his left UE, mostly in his hand. He states he has to lift things at work and things this may be exacerbating the pain. Denies any numbness or tingling or decreased grip strength. Denies any trauma or injuries to the left upper extremity or to his neck. He states hot showers and tylenol help alleviate his pain. It was discussed that this could be neuropathy due to this diabetes; however, his last Hgb A1c was 6.2 and seems well controlled. Will continue to monitor for now and he states he will inform me if it gets worse.  P- continue conservative measures with tylenol and hot showers    Refilled Losartan and Lipitor. Patient states he  checked his blood pressure recently and systolic was in the 220'U.    As always, pt is advised that if symptoms worsen or new symptoms arise, they should go to an urgent care facility or to to ER for further evaluation.   Consent and Medical Decision Making:   Patient discussed with Dr. Lynnae January  This is a telephone encounter between Margarito Liner and Jameshia Hayashida N Callista Hoh on 06/20/2018 for sciatica and left upper extremity pain. The visit was conducted with the patient located at home and Laina Guerrieri N Winnifred Dufford at [facility]. The patient's identity was confirmed using their DOB and current address. The patient has consented to being evaluated through a telephone encounter and understands the associated risks (an examination cannot be done and the patient may need to come in for an appointment) / benefits (allows the patient to remain at home, decreasing exposure to coronavirus). I personally spent 12 minutes on medical discussion.

## 2018-06-20 NOTE — Assessment & Plan Note (Signed)
Patient states over the last few months he has noticed a burning pain in his left UE, mostly in his hand. He states he has to lift things at work and things this may be exacerbating the pain. Denies any numbness or tingling or decreased grip strength. Denies any trauma or injuries to the left upper extremity or to his neck. He states hot showers and tylenol help alleviate his pain. It was discussed that this could be neuropathy due to this diabetes; however, his last Hgb A1c was 6.2 and seems well controlled. Will continue to monitor for now and he states he will inform me if it gets worse.  Plan: - continue conservative measures with tylenol and hot showers

## 2018-06-22 NOTE — Progress Notes (Signed)
Internal Medicine Clinic Attending  Case discussed with Dr. Rehman at the time of the visit.  We reviewed the resident's history and exam and pertinent patient test results.  I agree with the assessment, diagnosis, and plan of care documented in the resident's note.  

## 2018-07-07 MED FILL — HYDROCHLOROTHIAZIDE 25 MG T: 25 | 30 days supply | Qty: 30 | Fill #0

## 2018-08-09 ENCOUNTER — Encounter: Payer: Self-pay | Admitting: Internal Medicine

## 2018-08-14 ENCOUNTER — Other Ambulatory Visit: Payer: Self-pay | Admitting: Internal Medicine

## 2018-08-14 ENCOUNTER — Encounter: Payer: Self-pay | Admitting: Internal Medicine

## 2018-08-14 DIAGNOSIS — I10 Essential (primary) hypertension: Secondary | ICD-10-CM

## 2018-08-14 MED ORDER — FLUTICASONE PROPIONATE 50 MCG/ACT NA SUSP: 2.0000 | Freq: Every day | NASAL | 0 refills | Status: DC

## 2018-08-14 MED ORDER — CETIRIZINE HCL 10 MG PO CAPS: 10.0000 mg | ORAL_CAPSULE | Freq: Every day | ORAL | 0 refills | Status: DC

## 2018-08-14 MED FILL — HYDROCHLOROTHIAZIDE 25 MG T: 25 | 90 days supply | Qty: 90 | Fill #0

## 2018-09-16 ENCOUNTER — Encounter: Payer: Self-pay | Admitting: *Deleted

## 2018-11-20 ENCOUNTER — Encounter: Payer: Self-pay | Admitting: Internal Medicine

## 2018-12-08 ENCOUNTER — Other Ambulatory Visit: Payer: Self-pay

## 2018-12-08 DIAGNOSIS — Z20822 Contact with and (suspected) exposure to covid-19: Secondary | ICD-10-CM

## 2018-12-09 LAB — NOVEL CORONAVIRUS, NAA: SARS-CoV-2, NAA: NOT DETECTED

## 2018-12-27 ENCOUNTER — Encounter: Payer: Self-pay | Admitting: Gastroenterology

## 2019-01-11 MED FILL — HYDROCHLOROTHIAZIDE 25 MG T: 25 | 30 days supply | Qty: 30 | Fill #1

## 2019-01-16 ENCOUNTER — Encounter: Payer: BLUE CROSS/BLUE SHIELD | Admitting: Internal Medicine

## 2019-02-12 ENCOUNTER — Ambulatory Visit: Payer: BC Managed Care – PPO | Admitting: Dietician

## 2019-02-12 ENCOUNTER — Other Ambulatory Visit: Payer: Self-pay

## 2019-02-12 ENCOUNTER — Ambulatory Visit (INDEPENDENT_AMBULATORY_CARE_PROVIDER_SITE_OTHER): Payer: BC Managed Care – PPO | Admitting: Internal Medicine

## 2019-02-12 ENCOUNTER — Encounter: Payer: Self-pay | Admitting: Internal Medicine

## 2019-02-12 VITALS — BP 149/85 | HR 92 | Temp 98.1°F | Ht 67.0 in | Wt 186.7 lb

## 2019-02-12 DIAGNOSIS — E1169 Type 2 diabetes mellitus with other specified complication: Secondary | ICD-10-CM

## 2019-02-12 DIAGNOSIS — R7989 Other specified abnormal findings of blood chemistry: Secondary | ICD-10-CM | POA: Diagnosis not present

## 2019-02-12 DIAGNOSIS — E559 Vitamin D deficiency, unspecified: Secondary | ICD-10-CM | POA: Diagnosis not present

## 2019-02-12 DIAGNOSIS — Z0000 Encounter for general adult medical examination without abnormal findings: Secondary | ICD-10-CM

## 2019-02-12 DIAGNOSIS — M25562 Pain in left knee: Secondary | ICD-10-CM | POA: Diagnosis not present

## 2019-02-12 DIAGNOSIS — M5431 Sciatica, right side: Secondary | ICD-10-CM

## 2019-02-12 DIAGNOSIS — M25561 Pain in right knee: Secondary | ICD-10-CM | POA: Diagnosis not present

## 2019-02-12 DIAGNOSIS — E119 Type 2 diabetes mellitus without complications: Secondary | ICD-10-CM | POA: Diagnosis not present

## 2019-02-12 DIAGNOSIS — E785 Hyperlipidemia, unspecified: Secondary | ICD-10-CM

## 2019-02-12 DIAGNOSIS — Z79899 Other long term (current) drug therapy: Secondary | ICD-10-CM

## 2019-02-12 DIAGNOSIS — I10 Essential (primary) hypertension: Secondary | ICD-10-CM

## 2019-02-12 DIAGNOSIS — G8929 Other chronic pain: Secondary | ICD-10-CM

## 2019-02-12 LAB — POCT GLYCOSYLATED HEMOGLOBIN (HGB A1C): Hemoglobin A1C: 6.4 % — AB (ref 4.0–5.6)

## 2019-02-12 LAB — HM DIABETES EYE EXAM

## 2019-02-12 LAB — GLUCOSE, CAPILLARY: Glucose-Capillary: 99 mg/dL (ref 70–99)

## 2019-02-12 NOTE — Assessment & Plan Note (Signed)
BP Readings from Last 3 Encounters:  02/12/19 (!) 149/85  02/01/18 134/82  12/27/17 135/78    Lab Results  Component Value Date   NA 143 05/18/2017   K 3.7 05/18/2017   CREATININE 1.21 05/18/2017    Assessment: Blood pressure control:  Fair Progress toward BP goal:   Deteriorated Comments: Patient states that his blood pressures at home have been well controlled but given the recent stress in his life it has been fluctuating a little bit.  He is compliant with losartan 50 mg daily as well as hydrochlorothiazide 25 mg daily  Plan: Medications:  continue current medications Educational resources provided:   Self management tools provided:   Other plans: We will check BMP today

## 2019-02-12 NOTE — Assessment & Plan Note (Signed)
-  This problem is chronic and stable -Patient is compliant with atorvastatin 80 mg daily -We will recheck a lipid panel today

## 2019-02-12 NOTE — Assessment & Plan Note (Signed)
Lab Results  Component Value Date   HGBA1C 6.4 (A) 02/12/2019   HGBA1C 6.2 (A) 12/27/2017   HGBA1C 6.5 11/16/2016     Assessment: Diabetes control:  Well-controlled Progress toward A1C goal:   At goal Comments: Patient is diet controlled and continues to follow a good diet and exercises as well  Plan: Medications:  Diet-controlled Home glucose monitoring: Frequency:   Timing:   Instruction/counseling given: reminded to get eye exam and discussed the need for weight loss Educational resources provided:   Self management tools provided:   Other plans: We will check BMP today

## 2019-02-12 NOTE — Assessment & Plan Note (Signed)
-  Patient is due for repeat colonoscopy -Referral to GI placed -Patient will follow up with them for this

## 2019-02-12 NOTE — Patient Instructions (Signed)
-  It was a pleasure meeting you -Please continue to work on diet and exercise -We will check some blood work on you today -We will also refer you back to GI for colonoscopy -Please get your eye exam done -Please call me if you have any questions or concerns -Have a great holiday season!

## 2019-02-12 NOTE — Assessment & Plan Note (Addendum)
-  Patient was noted to have a mildly elevated creatinine last year which normalized on recheck -Patient's last BMP was in February 2019 -We will recheck his BMP today to ensure that his creatinine has remained normal

## 2019-02-12 NOTE — Assessment & Plan Note (Signed)
-  This problem is chronic and stable -Patient was supposed to start PT once the Covid pandemic has improved -We will discussed the possibility of PT with him at his follow-up visit

## 2019-02-12 NOTE — Progress Notes (Signed)
Retinal images were done and transmitted today.  

## 2019-02-12 NOTE — Assessment & Plan Note (Signed)
-  This problem is chronic and stable -We will recheck vitamin D level today -Patient is compliant with daily vitamin D supplementation at home

## 2019-02-12 NOTE — Progress Notes (Signed)
   Subjective:    Patient ID: Collin Lopez, male    DOB: October 29, 1965, 53 y.o.   MRN: [id number]  HPI  I have seen and examined this patient.  Patient is here for routine follow-up of his hypertension and diabetes.  Patient states that his brother died last week and that his uncle is currently admitted in the hospital with Covid and that there is a lot of stress at home.  He states that he is compliant with all his medications and denies any other complaints at this time.   Review of Systems  Constitutional: Negative.   HENT: Negative.   Respiratory: Negative.   Cardiovascular: Negative.   Gastrointestinal: Negative.   Musculoskeletal: Positive for arthralgias.       Has chronic bilateral knee pain right greater than left  Neurological: Negative.   Psychiatric/Behavioral: Negative.        Objective:   Physical Exam Constitutional:      Appearance: Normal appearance.  HENT:     Head: Normocephalic and atraumatic.  Neck:     Musculoskeletal: Neck supple.  Cardiovascular:     Rate and Rhythm: Normal rate and regular rhythm.     Heart sounds: Normal heart sounds.  Pulmonary:     Effort: Pulmonary effort is normal.     Breath sounds: Normal breath sounds. No wheezing or rales.  Abdominal:     General: Bowel sounds are normal. There is no distension.     Palpations: Abdomen is soft.     Tenderness: There is no abdominal tenderness.  Musculoskeletal:        General: No swelling or tenderness.  Lymphadenopathy:     Cervical: No cervical adenopathy.  Neurological:     General: No focal deficit present.     Mental Status: He is alert and oriented to person, place, and time.  Psychiatric:        Mood and Affect: Mood normal.        Behavior: Behavior normal.           Assessment & Plan:  Please see problem based charting for assessment and plan:

## 2019-02-12 NOTE — Assessment & Plan Note (Signed)
-  Patient states that he has chronic bilateral knee pain worse in the right knee and occasionally hears this pop -Patient's knee pain is at baseline currently -We talked about weight loss and patient will continue to exercise and try to lose weight -States that he does not need anything currently for this pain

## 2019-02-13 ENCOUNTER — Telehealth: Payer: Self-pay | Admitting: Internal Medicine

## 2019-02-13 LAB — BMP8+ANION GAP
Anion Gap: 18 mmol/L (ref 10.0–18.0)
BUN/Creatinine Ratio: 15 (ref 9–20)
BUN: 15 mg/dL (ref 6–24)
CO2: 22 mmol/L (ref 20–29)
Calcium: 9.6 mg/dL (ref 8.7–10.2)
Chloride: 103 mmol/L (ref 96–106)
Creatinine, Ser: 1.03 mg/dL (ref 0.76–1.27)
GFR calc Af Amer: 94 mL/min/{1.73_m2} (ref >59)
GFR calc non Af Amer: 81 mL/min/{1.73_m2} (ref >59)
Glucose: 98 mg/dL (ref 65–99)
Potassium: 4 mmol/L (ref 3.5–5.2)
Sodium: 143 mmol/L (ref 134–144)

## 2019-02-13 LAB — LIPID PANEL
Chol/HDL Ratio: 3.6 ratio (ref 0.0–5.0)
Cholesterol, Total: 127 mg/dL (ref 100–199)
HDL: 35 mg/dL — ABNORMAL LOW (ref >39)
LDL Chol Calc (NIH): 73 mg/dL (ref 0–99)
Triglycerides: 100 mg/dL (ref 0–149)
VLDL Cholesterol Cal: 19 mg/dL (ref 5–40)

## 2019-02-13 LAB — VITAMIN D 25 HYDROXY (VIT D DEFICIENCY, FRACTURES): Vit D, 25-Hydroxy: 63.1 ng/mL (ref 30.0–100.0)

## 2019-02-13 LAB — MICROALBUMIN / CREATININE URINE RATIO
Creatinine, Urine: 77.9 mg/dL
Microalb/Creat Ratio: 166 mg/g creat — ABNORMAL HIGH (ref 0–29)
Microalbumin, Urine: 129.4 ug/mL

## 2019-02-13 NOTE — Telephone Encounter (Signed)
I called the patient to discuss the results of his blood work with him.  Patient's BMP was within normal limits.  Patient was noted to have a mildly low HDL (35).  I encouraged the patient to continue with his diet and exercise.  Patient will also continue with current dose of Lipitor 80 mg daily.  Patient vitamin D level was within normal limits.  Patient was told he could continue with current dose of vitamin D.  Patient states that he will take this every other day.  We will recheck his vitamin D level at his follow-up visit.  If this is still within normal limits may consider DC vitamin D.  Patient states that he received a call from the GI office who stated that he does not need a colonoscopy till 2022.  He will follow-up with them for this if he has any GI issues.  Patient voiced understanding and is in agreement with plan.  No further work-up at this time.

## 2019-02-18 NOTE — Addendum Note (Signed)
Addended by: Hulan Fray on: 02/18/2019 05:35 PM   Modules accepted: Orders

## 2019-02-21 ENCOUNTER — Encounter (INDEPENDENT_AMBULATORY_CARE_PROVIDER_SITE_OTHER): Payer: BC Managed Care – PPO | Admitting: Dietician

## 2019-02-21 DIAGNOSIS — E1169 Type 2 diabetes mellitus with other specified complication: Secondary | ICD-10-CM

## 2019-02-27 NOTE — Addendum Note (Signed)
Addended by: Orson Gear on: 02/27/2019 10:19 AM   Modules accepted: Orders

## 2019-03-23 ENCOUNTER — Encounter: Payer: Self-pay | Admitting: Internal Medicine

## 2019-03-23 DIAGNOSIS — I10 Essential (primary) hypertension: Secondary | ICD-10-CM

## 2019-03-25 MED ORDER — HYDROCHLOROTHIAZIDE 25 MG PO TABS: 25.0000 mg | ORAL_TABLET | Freq: Every day | ORAL | 2 refills | Status: DC

## 2019-05-18 ENCOUNTER — Ambulatory Visit: Payer: BC Managed Care – PPO | Attending: Internal Medicine

## 2019-05-18 ENCOUNTER — Other Ambulatory Visit: Payer: Self-pay

## 2019-05-18 DIAGNOSIS — Z23 Encounter for immunization: Secondary | ICD-10-CM | POA: Insufficient documentation

## 2019-05-18 NOTE — Progress Notes (Signed)
   Covid-19 Vaccination Clinic  Name:  Collin Lopez    MRN: [id number] DOB: Dec 25, 1965  05/18/2019  [name] was observed post Covid-19 immunization for 15 minutes without incidence. He was provided with Vaccine Information Sheet and instruction to access the V-Safe system.   [name] was instructed to call 911 with any severe reactions post vaccine: Marland Kitchen Difficulty breathing  . Swelling of your face and throat  . A fast heartbeat  . A bad rash all over your body  . Dizziness and weakness    Immunizations Administered    Name Date Dose VIS Date Route   Pfizer COVID-19 Vaccine 05/18/2019  2:13 PM 0.3 mL 03/01/2019 Intramuscular   Manufacturer: [place]   Lot: [id number]   [place]: [id number]

## 2019-06-08 ENCOUNTER — Ambulatory Visit: Payer: BC Managed Care – PPO | Attending: Internal Medicine

## 2019-06-08 DIAGNOSIS — Z23 Encounter for immunization: Secondary | ICD-10-CM

## 2019-06-08 NOTE — Progress Notes (Signed)
   Covid-19 Vaccination Clinic  Name:  EMMERICK ANDO    MRN: [id number] DOB: [birth year]  06/08/2019  Mr. Catapano was observed post Covid-19 immunization for 15 minutes without incident. He was provided with Vaccine Information Sheet and instruction to access the V-Safe system.   Mr. Mounteer was instructed to call 911 with any severe reactions post vaccine: Marland Kitchen Difficulty breathing  . Swelling of face and throat  . A fast heartbeat  . A bad rash all over body  . Dizziness and weakness   Immunizations Administered    Name Date Dose VIS Date Route   Pfizer COVID-19 Vaccine 06/08/2019 11:58 AM 0.3 mL 03/01/2019 Intramuscular   Manufacturer: [place]   Lot: [id number]   [place]: [id number]

## 2019-06-22 ENCOUNTER — Other Ambulatory Visit: Payer: Self-pay

## 2019-06-22 ENCOUNTER — Ambulatory Visit (HOSPITAL_COMMUNITY)
Admission: EM | Admit: 2019-06-22 | Discharge: 2019-06-22 | Disposition: A | Discharge status: Home / Self Care | Payer: BC Managed Care – PPO | Attending: Family Medicine | Admitting: Family Medicine

## 2019-06-22 ENCOUNTER — Ambulatory Visit (INDEPENDENT_AMBULATORY_CARE_PROVIDER_SITE_OTHER): Payer: BC Managed Care – PPO

## 2019-06-22 ENCOUNTER — Encounter (HOSPITAL_COMMUNITY): Payer: Self-pay

## 2019-06-22 DIAGNOSIS — E119 Type 2 diabetes mellitus without complications: Secondary | ICD-10-CM | POA: Insufficient documentation

## 2019-06-22 DIAGNOSIS — D869 Sarcoidosis, unspecified: Secondary | ICD-10-CM | POA: Diagnosis not present

## 2019-06-22 DIAGNOSIS — Z719 Counseling, unspecified: Secondary | ICD-10-CM | POA: Insufficient documentation

## 2019-06-22 DIAGNOSIS — I10 Essential (primary) hypertension: Secondary | ICD-10-CM | POA: Diagnosis present

## 2019-06-22 LAB — POCT URINALYSIS DIP (DEVICE)
Bilirubin Urine: NEGATIVE
Glucose, UA: NEGATIVE mg/dL
Hgb urine dipstick: NEGATIVE
Ketones, ur: NEGATIVE mg/dL
Leukocytes,Ua: NEGATIVE
Nitrite: NEGATIVE
Protein, ur: 100 mg/dL — AB
Specific Gravity, Urine: 1.02 (ref 1.005–1.030)
Urobilinogen, UA: 1 mg/dL (ref 0.0–1.0)
pH: 8.5 — ABNORMAL HIGH (ref 5.0–8.0)

## 2019-06-22 LAB — CBC WITH DIFFERENTIAL/PLATELET
Abs Immature Granulocytes: 0.01 10*3/uL (ref 0.00–0.07)
Basophils Absolute: 0.1 10*3/uL (ref 0.0–0.1)
Basophils Relative: 1 %
Eosinophils Absolute: 0.3 10*3/uL (ref 0.0–0.5)
Eosinophils Relative: 5 %
HCT: 42.7 % (ref 39.0–52.0)
Hemoglobin: 13.9 g/dL (ref 13.0–17.0)
Immature Granulocytes: 0 %
Lymphocytes Relative: 47 %
Lymphs Abs: 2.2 10*3/uL (ref 0.7–4.0)
MCH: 30.2 pg (ref 26.0–34.0)
MCHC: 32.6 g/dL (ref 30.0–36.0)
MCV: 92.8 fL (ref 80.0–100.0)
Monocytes Absolute: 0.5 10*3/uL (ref 0.1–1.0)
Monocytes Relative: 11 %
Neutro Abs: 1.7 10*3/uL (ref 1.7–7.7)
Neutrophils Relative %: 36 %
Platelets: 290 10*3/uL (ref 150–400)
RBC: 4.6 MIL/uL (ref 4.22–5.81)
RDW: 13 % (ref 11.5–15.5)
WBC: 4.8 10*3/uL (ref 4.0–10.5)
nRBC: 0 % (ref 0.0–0.2)

## 2019-06-22 NOTE — Discharge Instructions (Addendum)
Your chest xray was negative for cardiac or lung disease.  Your urine was negative for infection today.  Your blood test and stool test are pending.  Your information and results for those will show up in your MyChart account.  Follow-up with primary care for any other concerns or complaints.

## 2019-06-22 NOTE — ED Provider Notes (Signed)
[facility]    CSN: [id number] Arrival date & time: 06/22/19  1127      History   Chief Complaint Chief Complaint  Patient presents with  . chest xray for travel    HPI Collin Lopez is a 54 y.o. male.   Patient reports that he is here for chest x-ray for travel to the [place].  He has a travel visa application with him in the office today.  His primary care physician does not have an x-ray machine in their office, he reports.  Patient also needs UA, CBC, stool studies for travel.  Reports that he is healthy, has no issues going on today.  Per chart review, patient has medical history significant for diabetes, hypertension.  States that he has not taken any of his hypertensive medications today yet.  Denies headache, cough, shortness of breath, chills, body aches, nausea, vomiting, diarrhea, rash, fever, other symptoms.  ROS per HPI  The history is provided by the patient.    Past Medical History:  Diagnosis Date  . Abnormality of gait   . Allergy    seasonal  . Asthma   . Congenital pes planus   . Diabetes mellitus without complication ([place])    diagnosed 2015  . Esophageal reflux   . OSA on CPAP   . Pain in joint, lower leg   . Sarcoidosis   . Sleep apnea    uses cpap  . Tear of medial cartilage or meniscus of knee, current   . Unspecified essential hypertension     Patient Active Problem List   Diagnosis Date Noted  . Pain of left hand 06/20/2018  . Sciatica of right side 12/27/2017  . Hip pain 05/18/2017  . Elevated serum creatinine 11/17/2016  . Vitamin D insufficiency 11/03/2014  . Erectile dysfunction 05/09/2014  . Healthcare maintenance 12/28/2013  . Diabetes mellitus type II, controlled ([place]) 02/22/2013  . Hyperlipidemia 07/30/2012  . Allergic rhinitis 07/27/2012  . Pulmonary Sarcoidosis 06/22/2009  . Essential hypertension 06/22/2009  . GERD 06/22/2009  . Knee pain, chronic 01/15/2009  . 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 KNEE SURGERY     left knee repair for medical meniscus tear       Home Medications    Prior to Admission medications   Medication Sig Start Date End Date Taking? Authorizing Provider  acetaminophen (TYLENOL) 500 MG tablet Take 2 tablets (1,000 mg total) by mouth every 8 (eight) hours as needed. 02/01/18   [name], MD  albuterol (PROAIR HFA) 108 (90 BASE) MCG/ACT inhaler Inhale 2 puffs into the lungs every 6 (six) hours as needed for wheezing or shortness of breath. For shortness of breath 04/16/14   [name], MD  aspirin 81 MG tablet Take 1 tablet (81 mg total) by mouth daily. 07/27/12   [name], MD  atorvastatin (LIPITOR) 80 MG tablet Take 1 tablet (80 mg total) by mouth daily. 06/20/18   Rehman, Areeg N, DO  Cetirizine HCl 10 MG CAPS Take 1 capsule (10 mg total) by mouth daily for 30 days. 08/14/18 09/13/18  Rehman, Areeg N, DO  cholecalciferol (VITAMIN D) 1000 units tablet Take 1 tablet (1,000 Units total) by mouth daily. 11/16/16   Maryellen Pile, MD  fluticasone (FLONASE) 50 MCG/ACT nasal spray Place 2 sprays into both nostrils daily. 08/14/18   Rehman, Areeg N, DO  hydrochlorothiazide (HYDRODIURIL) 25 MG tablet Take 1 tablet (25 mg total)  by mouth daily. 03/25/19   [name], MD  ibuprofen (ADVIL,MOTRIN) 200 MG tablet Take 400 mg by mouth every 6 (six) hours as needed for moderate pain. Reported on 10/07/2015    [provider]  losartan (COZAAR) 100 MG tablet Take 0.5 tablets (50 mg total) by mouth daily. 06/20/18   Rehman, Areeg N, DO  mometasone (NASONEX) 50 MCG/ACT nasal spray Place 2 sprays into the nose daily. Patient taking differently: Place 2 sprays into the nose daily as needed.  04/16/14   [name], MD  oxymetazoline (AFRIN) 0.05 % nasal spray Place 1 spray into both nostrils 2 (two) times daily. 07/26/17   [name], Elesa Hacker, PA-C    Family History Family History  Problem Relation  Age of Onset  . Heart disease Father        Died 3 years ago due to CHF and refused pacemaker.  . Cancer Mother        breast cancer passed away in 06-24-09.  . Colon cancer Neg Hx   . Rectal cancer Neg Hx   . Stomach cancer Neg Hx     Social History Social History   Tobacco Use  . Smoking status: Former Smoker    Packs/day: 0.20    Years: 3.00    Pack years: 0.60    Types: Cigarettes    Quit date: 03/21/1993    Years since quitting: 26.2  . Smokeless tobacco: Never Used  . Tobacco comment: 1ppd x 2 years  Substance Use Topics  . Alcohol use: No    Alcohol/week: 0.0 standard drinks  . Drug use: No     Allergies   Tramadol   Review of Systems Review of Systems   Physical Exam Triage Vital Signs ED Triage Vitals [06/22/19 1216]  Enc Vitals Group     BP (!) 167/96     Pulse Rate 80     Resp 18     Temp 98.3 F (36.8 C)     Temp Source Oral     SpO2 98 %     Weight      Height      Head Circumference      Peak Flow      Pain Score 0     Pain Loc      Pain Edu?      Excl. in [place]?    No data found.  Updated Vital Signs BP (!) 164/87 (BP Location: Right Arm)   Pulse 80   Temp 98.3 F (36.8 C) (Oral)   Resp 18   SpO2 98%   Visual Acuity Right Eye Distance:   Left Eye Distance:   Bilateral Distance:    Right Eye Near:   Left Eye Near:    Bilateral Near:     Physical Exam Vitals and nursing note reviewed.  Constitutional:      General: He is not in acute distress.    Appearance: Normal appearance. He is well-developed and normal weight. He is not ill-appearing.  HENT:     Head: Normocephalic and atraumatic.     Right Ear: Tympanic membrane normal.     Left Ear: Tympanic membrane normal.     Nose: Nose normal.     Mouth/Throat:     Mouth: Mucous membranes are moist.     Pharynx: Oropharynx is clear.  Eyes:     Extraocular Movements: Extraocular movements intact.     Conjunctiva/sclera: Conjunctivae normal.     Pupils: Pupils are equal,  round,  and reactive to light.  Cardiovascular:     Rate and Rhythm: Normal rate and regular rhythm.     Heart sounds: Normal heart sounds. No murmur.  Pulmonary:     Effort: Pulmonary effort is normal. No respiratory distress.     Breath sounds: Normal breath sounds.  Abdominal:     General: Bowel sounds are normal. There is no distension.     Palpations: Abdomen is soft. There is no mass.     Tenderness: There is no abdominal tenderness. There is no right CVA tenderness, left CVA tenderness, guarding or rebound.     Hernia: No hernia is present.  Musculoskeletal:        General: Normal range of motion.     Cervical back: Normal range of motion and neck supple.  Skin:    General: Skin is warm and dry.     Capillary Refill: Capillary refill takes less than 2 seconds.  Neurological:     General: No focal deficit present.     Mental Status: He is alert and oriented to person, place, and time.  Psychiatric:        Mood and Affect: Mood normal.        Behavior: Behavior normal.        Thought Content: Thought content normal.      UC Treatments / Results  Labs (all labs ordered are listed, but only abnormal results are displayed) Labs Reviewed  POCT URINALYSIS DIP (DEVICE) - Abnormal; Notable for the following components:      Result Value   pH 8.5 (*)    Protein, ur 100 (*)    All other components within normal limits  GI PATHOGEN PANEL BY PCR, STOOL  CBC WITH DIFFERENTIAL/PLATELET    EKG   Radiology No results found.  Procedures Procedures (including critical care time)  Medications Ordered in UC Medications - No data to display  Initial Impression / Assessment and Plan / UC Course  I have reviewed the triage vital signs and the nursing notes.  Pertinent labs & imaging results that were available during my care of the patient were reviewed by me and considered in my medical decision making (see chart for details).  Clinical Course as of Jun 25 2230  Sat Jun 22, 2019   1336 POCT urinalysis dip (device)(!) [SM]    Clinical Course User Index [SM] Faustino Congress, NP    Encounter for consultation: Patient needs lab screenings, chest x-ray for long-term travel visa to the [place].  Chest x-ray shows no active cardiopulmonary disease.  Normal cardiac silhouette.  UA negative for infection in office today.  Will not culture urine this is not needed.  CBC with differential drawn in office today, stool sample left in office today as well.  Patient informed that these results will show up in his MyChart account unless there is a result that needs treatment, then we will contact him.  Instructed to follow-up with primary care as needed.  X-ray report, AP chest x-ray image, and UA results printed and given to patient today. Final Clinical Impressions(s) / UC Diagnoses   Final diagnoses:  Encounter for consultation  Type 2 diabetes mellitus without complication, without long-term current use of insulin ([place])  Essential hypertension     Discharge Instructions     Your chest xray was negative for cardiac or lung disease.  Your urine was negative for infection today.  Your blood test and stool test are pending.  Your information and  results for those will show up in your MyChart account.  Follow-up with primary care for any other concerns or complaints.    ED Prescriptions    None     PDMP not reviewed this encounter.   Faustino Congress, NP 06/25/19 2232

## 2019-06-22 NOTE — ED Triage Notes (Signed)
Pt requests cxr for travel to [place]. Pt states he has received both COVID injections. Denies any physical complaints.

## 2019-06-25 LAB — GI PATHOGEN PANEL BY PCR, STOOL

## 2019-07-17 ENCOUNTER — Encounter: Payer: Self-pay | Admitting: Internal Medicine

## 2019-07-18 ENCOUNTER — Encounter: Payer: Self-pay | Admitting: Internal Medicine

## 2019-07-18 ENCOUNTER — Other Ambulatory Visit: Payer: Self-pay

## 2019-07-18 ENCOUNTER — Ambulatory Visit (INDEPENDENT_AMBULATORY_CARE_PROVIDER_SITE_OTHER): Payer: BC Managed Care – PPO | Admitting: Internal Medicine

## 2019-07-18 VITALS — BP 142/85 | HR 70 | Temp 97.8°F | Ht 67.0 in | Wt 195.4 lb

## 2019-07-18 DIAGNOSIS — J3089 Other allergic rhinitis: Secondary | ICD-10-CM

## 2019-07-18 NOTE — Progress Notes (Signed)
CC: allergic rhinitis  HPI:  [name] is a 54 y.o. male with PMH below.  Today we will address allergic rhinitis     Please see A&P for status of the patient's chronic medical conditions  Past Medical History:  Diagnosis Date  . Abnormality of gait   . Allergy    seasonal  . Asthma   . Congenital pes planus   . Diabetes mellitus without complication ([place])    diagnosed 2015  . Esophageal reflux   . OSA on CPAP   . Pain in joint, lower leg   . Sarcoidosis   . Sleep apnea    uses cpap  . Tear of medial cartilage or meniscus of knee, current   . Unspecified essential hypertension    Review of Systems:  ROS: Pulmonary: pt denies increased work of breathing, shortness of breath,  Cardiac: pt denies palpitations, chest pain,  Abdominal: pt denies abdominal pain, nausea, vomiting, or diarrhea   Physical Exam:  Vitals:   07/18/19 0840  BP: (!) 157/80  Pulse: 77  Temp: 97.8 F (36.6 C)  TempSrc: Oral  SpO2: 100%  Weight: 195 lb 6.4 oz (88.6 kg)  Height: 5\' 7"  (1.702 m)   Physical Exam Constitutional:      Appearance: He is not diaphoretic.  HENT:     Right Ear: Tympanic membrane normal.     Left Ear: Tympanic membrane normal.     Nose: Congestion and rhinorrhea present.     Mouth/Throat:     Pharynx: No oropharyngeal exudate or posterior oropharyngeal erythema.  Eyes:     General: No scleral icterus.       Right eye: No discharge.        Left eye: No discharge.  Cardiovascular:     Rate and Rhythm: Normal rate and regular rhythm.     Heart sounds: Normal heart sounds. No murmur. No friction rub. No gallop.   Pulmonary:     Effort: Pulmonary effort is normal. No respiratory distress.     Breath sounds: Normal breath sounds. No wheezing or rales.  Neurological:     Mental Status: He is alert.      Social History   Socioeconomic History  . Marital status: Married    Spouse name: Collin Lopez  . Number of children: Not on file  . Years  of education: 67  . Highest education level: Not on file  Occupational History  . 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 Smoking status: Former Smoker    Packs/day: 0.20    Years: 3.00    Pack years: 0.60    Types: Cigarettes    Quit date: 03/21/1993    Years since quitting: 26.3  . Smokeless tobacco: Never Used  . Tobacco comment: 1ppd x 2 years  Substance and Sexual Activity  . Alcohol use: No    Alcohol/week: 0.0 standard drinks  . Drug use: No  . Sexual activity: Yes    Partners: Female  Other Topics Concern  . Not on file  Social History Narrative   Patient is worker at [employer] since 6 years.         Social Determinants of Health   Financial Resource Strain:   . Difficulty of Paying Living Expenses:   Food Insecurity:   . Worried About Charity fundraiser in the Last Year:   . Arboriculturist in  the Last Year:   Transportation Needs:   . Film/video editor (Medical):   Marland Kitchen Lack of Transportation (Non-Medical):   Physical Activity:   . Days of Exercise per Week:   . Minutes of Exercise per Session:   Stress:   . Feeling of Stress :   Social Connections:   . Frequency of Communication with Friends and Family:   . Frequency of Social Gatherings with Friends and Family:   . Attends Religious Services:   . Active Member of Clubs or Organizations:   . Attends Archivist Meetings:   Marland Kitchen Marital Status:   Intimate Partner Violence:   . Fear of Current or Ex-Partner:   . Emotionally Abused:   Marland Kitchen Physically Abused:   . Sexually Abused:     Family History  Problem Relation Age of Onset  . Heart disease Father        Died 3 years ago due to CHF and refused pacemaker.  . Cancer Mother        breast cancer passed away in July 07, 2009.  . Colon cancer Neg Hx   . Rectal cancer Neg Hx   . Stomach cancer Neg Hx     Assessment & Plan:   See Encounters Tab for problem based  charting.  Patient discussed with Dr. Philipp Ovens

## 2019-07-18 NOTE — Assessment & Plan Note (Addendum)
Worsening allergies starting on Tuesday.  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   He has documented allergies to several grass species.  He is taking flonase and ceterizine.    -went over correct usage of flonase -he will purchase a nasal irrigation device and use this -we also discussed wearing an N95 mask when he has to go out in grassy areas and to avoid if possible -offered allergy and immunology referral for consideration of sensitization but he declined at this time

## 2019-07-18 NOTE — Patient Instructions (Signed)
Please switch to N95 mask when working outside.  Use flonase daily as you are doing and your ceterizine.  Please purchase a nasal irrigation system system.

## 2019-07-19 ENCOUNTER — Ambulatory Visit: Payer: BC Managed Care – PPO

## 2019-07-19 NOTE — Progress Notes (Signed)
Internal Medicine Clinic Attending  Case discussed with Dr. Winfrey  at the time of the visit.  We reviewed the resident's history and exam and pertinent patient test results.  I agree with the assessment, diagnosis, and plan of care documented in the resident's note.  

## 2019-07-30 ENCOUNTER — Encounter: Payer: BC Managed Care – PPO | Admitting: Internal Medicine

## 2019-08-05 ENCOUNTER — Other Ambulatory Visit: Payer: Self-pay

## 2019-08-05 ENCOUNTER — Ambulatory Visit: Payer: BC Managed Care – PPO | Admitting: Internal Medicine

## 2019-08-05 VITALS — BP 131/89 | HR 90 | Temp 98.0°F | Ht 67.0 in | Wt 193.5 lb

## 2019-08-05 DIAGNOSIS — J309 Allergic rhinitis, unspecified: Secondary | ICD-10-CM | POA: Diagnosis not present

## 2019-08-05 DIAGNOSIS — J3089 Other allergic rhinitis: Secondary | ICD-10-CM

## 2019-08-05 MED ORDER — LORATADINE 10 MG PO CAPS: 10.0000 mg | ORAL_CAPSULE | Freq: Every day | ORAL | 0 refills | Status: AC

## 2019-08-05 NOTE — Assessment & Plan Note (Addendum)
Patient states that he has been having watery eyes, postnasal drip, sneezing, cough for the past few days. Denies dyspnea, chest pain, wheezing. He has a history of allergic rhinitis which has flared frequently. Per allergy panel on 03/20/15 the patient was allergic to grass pollen.  The patient has been taking flonase. He previously used to take cetirizine as well, but did not find it to be that helpful.   Assessment and plan  Patient seems to have frequent allergic rhinitis flares.   -switch to loratadine 10mg  qd  -allergy and immunology referral to investigate what is trigger  -wear face mask

## 2019-08-05 NOTE — Progress Notes (Signed)
ca  CC: Sneezing  HPI:  [name] is a 54 y.o. with essential hypertension, diabetes mellitus type 2, and allergic rhinitis who presents for follow up of allergic rhinitis. Please see problem based charting for evaluation, assessment, and plan.  Past Medical History:  Diagnosis Date  . Abnormality of gait   . Allergy    seasonal  . Asthma   . Congenital pes planus   . Diabetes mellitus without complication ([place])    diagnosed 2015  . Esophageal reflux   . OSA on CPAP   . Pain in joint, lower leg   . Sarcoidosis   . Sleep apnea    uses cpap  . Tear of medial cartilage or meniscus of knee, current   . Unspecified essential hypertension    Review of Systems:    Per hpi  Physical Exam:  Vitals:   08/05/19 1559 08/05/19 1604  BP: (!) 131/102 (!) 142/80  Pulse: 85 93  Temp: 98 F (36.7 C)   TempSrc: Oral   SpO2: 100%   Weight: 193 lb 8 oz (87.8 kg)   Height: 5\' 7"  (1.702 m)    Physical Exam  Constitutional: He is oriented to person, place, and time. He appears well-developed and well-nourished. No distress.  HENT:  Head: Normocephalic and atraumatic.  Erythematous ear canal bilaterally. Mild frontal and maxillary sinus tenderness  Eyes: Conjunctivae are normal.  Cardiovascular: Normal rate, regular rhythm and normal heart sounds.  Respiratory: Effort normal and breath sounds normal. No respiratory distress. He has no wheezes.  GI: Soft. Bowel sounds are normal. He exhibits no distension. There is no abdominal tenderness.  Musculoskeletal:        General: No edema.  Neurological: He is alert and oriented to person, place, and time.  Skin: He is not diaphoretic.    Assessment & Plan:   See Encounters Tab for problem based charting.  Patient discussed with Dr. Rebeca Alert

## 2019-08-05 NOTE — Patient Instructions (Signed)
It was a pleasure to see you today Mr. Giaimo. Please make the following changes:  -please continue using flonase  -start using loratadine instead of cetirizine  -you have been referred to allergy and immunology  If you have any questions or concerns, please call our clinic at [phone] between 9am-5pm and after hours call [phone] and ask for the internal medicine resident on call. If you feel you are having a medical emergency please call 911.   Thank you, we look forward to help you remain healthy!  [name], MD Internal Medicine PGY3

## 2019-08-06 NOTE — Progress Notes (Signed)
Internal Medicine Clinic Attending  Case discussed with Dr. Chundi at the time of the visit.  We reviewed the resident's history and exam and pertinent patient test results.  I agree with the assessment, diagnosis, and plan of care documented in the resident's note.  Cosme Jacob, M.D., Ph.D.  

## 2019-08-09 ENCOUNTER — Encounter: Payer: Self-pay | Admitting: Internal Medicine

## 2019-09-25 ENCOUNTER — Ambulatory Visit: Payer: BC Managed Care – PPO | Admitting: Allergy

## 2019-09-25 ENCOUNTER — Encounter: Payer: Self-pay | Admitting: Allergy

## 2019-09-25 ENCOUNTER — Other Ambulatory Visit: Payer: Self-pay

## 2019-09-25 VITALS — BP 122/88 | HR 87 | Temp 98.2°F | Resp 14 | Ht 67.0 in | Wt 192.2 lb

## 2019-09-25 DIAGNOSIS — R431 Parosmia: Secondary | ICD-10-CM | POA: Diagnosis not present

## 2019-09-25 DIAGNOSIS — J301 Allergic rhinitis due to pollen: Secondary | ICD-10-CM | POA: Diagnosis not present

## 2019-09-25 MED ORDER — AZELASTINE HCL 0.1 % NA SOLN: 2.0000 | Freq: Two times a day (BID) | NASAL | 5 refills | Status: AC

## 2019-09-25 MED FILL — AZELASTINE HCL 137 MCG SPRY: 0.1 | 25 days supply | Qty: 30 | Fill #0

## 2019-09-25 NOTE — Patient Instructions (Addendum)
-   environmental allergy testing today is positive to grass pollens - allergen avoidance measures discussed/handouts provided - continue Claritin 10mg  daily - use Flonase 2 sprays each nostril daily for 1-2 weeks at a time if having nasal congestion - start nasal antihistamine, Astelin 2 sprays twice a day as needed for nasal drainage and post-nasal drip - recommend wearing a N95 mask or equivalent with tight seal around mouth when mowing the grass - would also recommend wearing N95 mask or equivalent when performing work duties with odorous materials/fumes.  Odors/fumes can be irritants to the airway and can lead to nasal drainage, nasal congestion, cough, watery eyes.  This is not an allergen but an irritant effect.    Follow-up 6 months or sooner if needed

## 2019-09-25 NOTE — Progress Notes (Signed)
New Patient Note  RE: Collin Lopez MRN: [id number] DOB: 03-Feb-1966 Date of Office Visit: 09/25/2019  Referring provider: Bartholomew Crews, MD Primary care provider: [name], MD  Chief Complaint: allergies  History of present illness: Collin Lopez is a 54 y.o. male presenting today for consultation for allergies.  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 He states that clearing the dust, despite wearing a facemask, makes him have continued coughing fits. 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 He describes that the fumes typically make him feel nauseated, and lightheaded. 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   For his seasonal allergies, Collin Lopez states that he typically has allergies in the Spring and Summer. His symptoms are typically a runny nose and watery/itchy eyes. He takes Claritin, which provides much relief.  He also has Flonase which he does not use on a consistent basis.  Review of systems: Review of Systems  Constitutional: Negative.   HENT:       See HPI  Eyes: Negative.   Respiratory:       See HPI  Cardiovascular: Negative.   Gastrointestinal:       See HPI  Musculoskeletal: Negative.   Skin: Negative.   Neurological:       See HPI    All other systems negative unless noted above in HPI  Past medical history: Past Medical History:  Diagnosis Date  .  Abnormality of gait   . Allergy    seasonal  . Asthma   . Congenital pes planus   . Diabetes mellitus without complication ([place])    diagnosed 07/03/2013  . Esophageal reflux   . OSA on CPAP   . Pain in joint, lower leg   . Sarcoidosis   . Sleep apnea    uses cpap  . Tear of medial cartilage or meniscus of knee, current   . Unspecified essential hypertension     Past surgical history: Past Surgical History:  Procedure Laterality Date  . KNEE SURGERY     left knee repair for medical meniscus tear  . NOSE SURGERY  1985    Family history:  Family History  Problem Relation Age of Onset  . Heart disease Father        Died 3 years ago due to CHF and refused pacemaker.  . Cancer Mother        breast cancer passed away in 07-03-2009.  Marland Kitchen Heart attack Brother   . Colon cancer Neg Hx   . Rectal cancer Neg Hx   . Stomach cancer Neg Hx     Social history: Lives in a home with carpeting with electric heating and central cooling.  No pets in the home.  No concern for water damage, mildew or roaches in the home.  Tobacco Use  . Smoking status: Former Smoker    Packs/day: 0.20    Years: 3.00    Pack years:  0.60    Types: Cigarettes    Quit date: 03/21/1993    Years since quitting: 26.5  . Smokeless tobacco: Never Used  . Tobacco comment: 1ppd x 2 years     Medication List: Current Outpatient Medications  Medication Sig Dispense Refill  . acetaminophen (TYLENOL) 500 MG tablet Take 2 tablets (1,000 mg total) by mouth every 8 (eight) hours as needed. 30 tablet 0  . albuterol (PROAIR HFA) 108 (90 BASE) MCG/ACT inhaler Inhale 2 puffs into the lungs every 6 (six) hours as needed for wheezing or shortness of breath. For shortness of breath 1 Inhaler 11  . aspirin 81 MG tablet Take 1 tablet (81 mg total) by mouth daily. 30 tablet   . atorvastatin (LIPITOR) 80 MG tablet Take 1 tablet (80 mg total) by mouth daily. 90 tablet 3  . cholecalciferol (VITAMIN D) 1000 units tablet Take 1 tablet (1,000  Units total) by mouth daily. 30 tablet 2  . fluticasone (FLONASE) 50 MCG/ACT nasal spray Place 2 sprays into both nostrils daily. 16 g 0  . hydrochlorothiazide (HYDRODIURIL) 25 MG tablet Take 1 tablet (25 mg total) by mouth daily. 90 tablet 2  . ibuprofen (ADVIL,MOTRIN) 200 MG tablet Take 400 mg by mouth every 6 (six) hours as needed for moderate pain. Reported on 10/07/2015    . Loratadine 10 MG CAPS Take 1 capsule (10 mg total) by mouth daily. 30 capsule 0  . losartan (COZAAR) 100 MG tablet Take 0.5 tablets (50 mg total) by mouth daily. 90 tablet 3  . oxymetazoline (AFRIN) 0.05 % nasal spray Place 1 spray into both nostrils 2 (two) times daily. 30 mL 0  . mometasone (NASONEX) 50 MCG/ACT nasal spray Place 2 sprays into the nose daily. (Patient not taking: Reported on 09/25/2019) 17 g 6   No current facility-administered medications for this visit.    Known medication allergies: Allergies  Allergen Reactions  . Tramadol Nausea And Vomiting     Physical examination: Blood pressure 122/88, pulse 87, temperature 98.2 F (36.8 C), temperature source Temporal, resp. rate 14, height 5\' 7"  (1.702 m), weight 192 lb 3.2 oz (87.2 kg), SpO2 98 %.  General: Alert, interactive, in no acute distress. HEENT: PERRLA, TMs pearly gray, turbinates minimally edematous without discharge, post-pharynx non erythematous. Neck: Supple without lymphadenopathy. Lungs: Clear to auscultation without wheezing, rhonchi or rales. {no increased work of breathing. CV: Normal S1, S2 without murmurs. Abdomen: Nondistended, nontender. Skin: Warm and dry, without lesions or rashes. Extremities:  No clubbing, cyanosis or edema. Neuro:   Grossly intact.  Diagnositics/Labs:  Allergy testing: Environmental allergy skin prick testing is positive to [place], [place], [place], [place], perennial rye, [place]. Intradermal testing is negative. Allergy testing results were read and interpreted by provider, documented by clinical  staff.   Assessment and plan: Allergic rhinitis Irritant reaction related to noxious odors  - environmental allergy testing today is positive to grass pollens - allergen avoidance measures discussed/handouts provided - continue Claritin 10mg  daily - use Flonase 2 sprays each nostril daily for 1-2 weeks at a time if having nasal congestion - start nasal antihistamine, Astelin 2 sprays twice a day as needed for nasal drainage and post-nasal drip - recommend wearing a N95 mask or equivalent with tight seal around mouth when mowing the grass - would also recommend wearing N95 mask or equivalent when performing work duties with odorous materials/fumes.  Odors/fumes can be irritants to the airway and can lead to nasal drainage, nasal congestion, cough, watery  eyes.  This is not an allergen but an irritant effect.    Follow-up 6 months or sooner if needed  I appreciate the opportunity to take part in Marquest's care. Please do not hesitate to contact me with questions.  Sincerely,   Prudy Feeler, MD Allergy/Immunology [facility]

## 2019-10-22 ENCOUNTER — Other Ambulatory Visit: Payer: Self-pay | Admitting: *Deleted

## 2019-10-22 DIAGNOSIS — E785 Hyperlipidemia, unspecified: Secondary | ICD-10-CM

## 2019-10-23 MED ORDER — ATORVASTATIN CALCIUM 80 MG PO TABS: 80.0000 mg | ORAL_TABLET | Freq: Every day | ORAL | 3 refills | Status: DC

## 2019-11-27 ENCOUNTER — Other Ambulatory Visit: Payer: Self-pay | Admitting: *Deleted

## 2019-11-27 DIAGNOSIS — I10 Essential (primary) hypertension: Secondary | ICD-10-CM

## 2019-11-27 MED ORDER — LOSARTAN POTASSIUM 100 MG PO TABS: 50.0000 mg | ORAL_TABLET | Freq: Every day | ORAL | 3 refills | Status: DC

## 2019-12-11 ENCOUNTER — Other Ambulatory Visit: Payer: Self-pay | Admitting: Internal Medicine

## 2019-12-11 DIAGNOSIS — I10 Essential (primary) hypertension: Secondary | ICD-10-CM

## 2020-01-08 ENCOUNTER — Encounter: Payer: Self-pay | Admitting: Internal Medicine

## 2020-04-23 ENCOUNTER — Other Ambulatory Visit: Payer: Self-pay

## 2020-04-23 ENCOUNTER — Encounter: Payer: Self-pay | Admitting: Sports Medicine

## 2020-04-23 ENCOUNTER — Ambulatory Visit (INDEPENDENT_AMBULATORY_CARE_PROVIDER_SITE_OTHER): Payer: BC Managed Care – PPO | Admitting: Sports Medicine

## 2020-04-23 VITALS — BP 128/82 | Ht 67.0 in

## 2020-04-23 DIAGNOSIS — M25512 Pain in left shoulder: Secondary | ICD-10-CM | POA: Diagnosis not present

## 2020-04-23 DIAGNOSIS — G8929 Other chronic pain: Secondary | ICD-10-CM | POA: Diagnosis not present

## 2020-04-23 MED ORDER — METHYLPREDNISOLONE ACETATE 40 MG/ML IJ SUSP
40.0000 mg | Freq: Once | INTRAMUSCULAR | Status: AC
  Administered 2020-04-23: 40 mg via INTRA_ARTICULAR

## 2020-04-23 NOTE — Progress Notes (Signed)
   Subjective:    Patient ID: Collin Lopez, male    DOB: 1965-11-21, 55 y.o.   MRN: [id number]  HPI chief complaint: Left shoulder pain  Rad is a right-hand-dominant 56 year old male that comes in today complaining of some returning left shoulder pain.  He was last seen in the office in 2016 with a similar complaint.  Ultrasound at that time showed no evidence of rotator cuff tearing.  He was given 2 subacromial cortisone injections as well as a home exercise program.  Symptoms eventually resolved.  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  He endorses pain with lifting heavy objects overhead or having to lift and throw heavy items such as trash bags.  Eventually, he had to quit that job due to his pain.  He does endorse some nighttime pain as well.  He has not noticed any weakness.  He uses Tylenol, 81 mg of aspirin, topical Voltaren gel, and topical Aleve as needed.  He does note that the combination of a single Tylenol and 81 mg of aspirin do help.  Pain will radiate to the elbow but no associated numbness or tingling.  Past medical history reviewed Medications reviewed Allergies reviewed    Review of Systems As above    Objective:   Physical Exam  Well-developed, well-nourished.  No acute distress  Left shoulder: Patient has full shoulder range of motion.  No atrophy.  Positive painful arc.  Pain with internal rotation actively.  No tenderness over the acromioclavicular joint.  No tenderness over the bicipital groove.  Rotator cuff strength is 5/5 bilaterally but does reproduce pain with resisted supraspinatus and infraspinatus.  Positive empty can, positive [place].  Neurovascularly intact distally.      Assessment & Plan:   Returning left shoulder pain likely secondary to rotator cuff tendinopathy  Patient has had good success in the past with subacromial cortisone injections and home exercises.  So, a subacromial  cortisone injection was repeated today.  Collin Lopez was reeducated on a [facility] home exercise program.  If symptoms persist then we will consider updated imaging.  Follow-up for ongoing or recalcitrant issues.

## 2020-05-06 ENCOUNTER — Encounter: Payer: Self-pay | Admitting: Internal Medicine

## 2020-05-07 LAB — HM DIABETES EYE EXAM

## 2020-06-01 ENCOUNTER — Encounter: Payer: Self-pay | Admitting: *Deleted

## 2020-07-28 ENCOUNTER — Encounter: Payer: BC Managed Care – PPO | Admitting: Internal Medicine

## 2020-08-18 ENCOUNTER — Encounter: Payer: Self-pay | Admitting: Internal Medicine

## 2020-08-18 ENCOUNTER — Telehealth: Payer: Self-pay | Admitting: *Deleted

## 2020-08-18 ENCOUNTER — Other Ambulatory Visit (HOSPITAL_COMMUNITY): Payer: Self-pay

## 2020-08-18 ENCOUNTER — Encounter: Payer: Self-pay | Admitting: Physician Assistant

## 2020-08-18 ENCOUNTER — Telehealth: Payer: BC Managed Care – PPO | Admitting: Physician Assistant

## 2020-08-18 ENCOUNTER — Encounter: Payer: BC Managed Care – PPO | Admitting: Internal Medicine

## 2020-08-18 DIAGNOSIS — U071 COVID-19: Secondary | ICD-10-CM

## 2020-08-18 MED ORDER — BENZONATATE 100 MG PO CAPS
100.0000 mg | ORAL_CAPSULE | Freq: Three times a day (TID) | ORAL | 0 refills | Status: DC | PRN
  Filled 2020-08-18: qty 20, 7d supply, fill #0

## 2020-08-18 MED ORDER — NIRMATRELVIR/RITONAVIR (PAXLOVID)TABLET
ORAL_TABLET | ORAL | 0 refills | Status: DC
  Filled 2020-08-18: qty 30, 5d supply, fill #0

## 2020-08-18 MED ORDER — MOLNUPIRAVIR EUA 200MG CAPSULE
4.0000 | ORAL_CAPSULE | Freq: Two times a day (BID) | ORAL | 0 refills | Status: AC
  Filled 2020-08-18: qty 40, 5d supply, fill #0

## 2020-08-18 NOTE — Telephone Encounter (Signed)
Thank you :)

## 2020-08-18 NOTE — Progress Notes (Signed)
[name], wohl are scheduled for a virtual visit with your provider today.    Just as we do with appointments in the office, we must obtain your consent to participate.  Your consent will be active for this visit and any virtual visit you may have with one of our providers in the next 365 days.    If you have a MyChart account, I can also send a copy of this consent to you electronically.  All virtual visits are billed to your insurance company just like a traditional visit in the office.  As this is a virtual visit, video technology does not allow for your provider to perform a traditional examination.  This may limit your provider's ability to fully assess your condition.  If your provider identifies any concerns that need to be evaluated in person or the need to arrange testing such as labs, EKG, etc, we will make arrangements to do so.    Although advances in technology are sophisticated, we cannot ensure that it will always work on either your end or our end.  If the connection with a video visit is poor, we may have to switch to a telephone visit.  With either a video or telephone visit, we are not always able to ensure that we have a secure connection.   I need to obtain your verbal consent now.   Are you willing to proceed with your visit today?   Collin Lopez has provided verbal consent on 08/18/2020 for a virtual visit (video or telephone).   Abigail Butts, PA-C 08/18/2020  8:59 AM   Date:  08/18/2020   ID:  Collin Lopez, DOB [birth year], MRN [id number]  Patient Location: Home Provider Location: Home Office   Participants: Patient and Provider for Visit and Wrap up  Method of visit: Video  Location of Patient: Home Location of Provider: Home Office Consent was obtain for visit over the video. Services rendered by provider: Visit was performed via video  A video enabled telemedicine application was used and I verified that I am speaking with the correct person using  two identifiers.  PCP:  [name], MD   Chief Complaint:  COVID +  History of Present Illness:    Collin Lopez is a 55 y.o. male with history as stated below. Presents video telehealth for an acute care visit  Onset of symptoms was 5/26 and symptoms have been persistent and include: fever, chills, cough, diarrhea  Pt reports he just returned from the [place] on May 25th and had a negative test on that date  Modifying factors include: tylenol and allergy medication without significant improvement.   No other aggravating or relieving factors.  No other c/o.  Medications: Losartan, HCTZ, vitamin C and D, cetirizine, tylenol  The patient does have symptoms concerning for COVID-19 infection (fever, chills, cough, or new shortness of breath).  Patient has been tested for COVID during this illness - result: positive  Past Medical, Surgical, Social History, Allergies, and Medications have been Reviewed.  Patient Active Problem List   Diagnosis Date Noted  . Pain of left hand 06/20/2018  . Sciatica of right side 12/27/2017  . Hip pain 05/18/2017  . Elevated serum creatinine 11/17/2016  . Vitamin D insufficiency 11/03/2014  . Erectile dysfunction 05/09/2014  . Healthcare maintenance 12/28/2013  . Diabetes mellitus type II, controlled ([place]) 02/22/2013  . Hyperlipidemia 07/30/2012  . Allergic rhinitis 07/27/2012  . Pulmonary Sarcoidosis 06/22/2009  . Essential hypertension 06/22/2009  . GERD 06/22/2009  .  Knee pain, chronic 01/15/2009  . 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 Smoking status: Former Smoker    Packs/day: 0.20    Years: 3.00    Pack years: 0.60    Types: Cigarettes    Quit date: 03/21/1993    Years since quitting: 27.4  . Smokeless tobacco: Never Used  . Tobacco comment: 1ppd x 2 years  Substance Use Topics  . Alcohol use: No    Alcohol/week: 0.0 standard drinks     Current Outpatient Medications:  .   benzonatate (TESSALON PERLES) 100 MG capsule, Take 1 capsule (100 mg total) by mouth 3 (three) times daily as needed for cough (cough)., Disp: 20 capsule, Rfl: 0 .  molnupiravir EUA 200 mg CAPS, Take 4 capsules (800 mg total) by mouth 2 (two) times daily for 5 days., Disp: 40 capsule, Rfl: 0 .  acetaminophen (TYLENOL) 500 MG tablet, Take 2 tablets (1,000 mg total) by mouth every 8 (eight) hours as needed., Disp: 30 tablet, Rfl: 0 .  albuterol (PROAIR HFA) 108 (90 BASE) MCG/ACT inhaler, Inhale 2 puffs into the lungs every 6 (six) hours as needed for wheezing or shortness of breath. For shortness of breath, Disp: 1 Inhaler, Rfl: 11 .  aspirin 81 MG tablet, Take 1 tablet (81 mg total) by mouth daily., Disp: 30 tablet, Rfl:  .  atorvastatin (LIPITOR) 80 MG tablet, Take 1 tablet (80 mg total) by mouth daily., Disp: 90 tablet, Rfl: 3 .  azelastine (ASTELIN) 0.1 % nasal spray, Place 2 sprays into both nostrils 2 (two) times daily., Disp: 30 mL, Rfl: 5 .  cholecalciferol (VITAMIN D) 1000 units tablet, Take 1 tablet (1,000 Units total) by mouth daily., Disp: 30 tablet, Rfl: 2 .  fluticasone (FLONASE) 50 MCG/ACT nasal spray, Place 2 sprays into both nostrils daily., Disp: 16 g, Rfl: 0 .  hydrochlorothiazide (HYDRODIURIL) 25 MG tablet, TAKE 1 TABLET BY MOUTH EVERY DAY, Disp: 90 tablet, Rfl: 2 .  ibuprofen (ADVIL,MOTRIN) 200 MG tablet, Take 400 mg by mouth every 6 (six) hours as needed for moderate pain. Reported on 10/07/2015, Disp: , Rfl:  .  Loratadine 10 MG CAPS, Take 1 capsule (10 mg total) by mouth daily., Disp: 30 capsule, Rfl: 0 .  losartan (COZAAR) 100 MG tablet, Take 0.5 tablets (50 mg total) by mouth daily., Disp: 90 tablet, Rfl: 3 .  mometasone (NASONEX) 50 MCG/ACT nasal spray, Place 2 sprays into the nose daily. (Patient not taking: Reported on 09/25/2019), Disp: 17 g, Rfl: 6 .  oxymetazoline (AFRIN) 0.05 % nasal spray, Place 1 spray into both nostrils 2 (two) times daily., Disp: 30 mL, Rfl: 0    Allergies  Allergen Reactions  . Tramadol Nausea And Vomiting     Review of Systems  Constitutional: Positive for chills, fever and malaise/fatigue.  HENT: Positive for congestion and sore throat. Negative for ear pain.   Eyes: Negative for blurred vision and double vision.  Respiratory: Positive for cough. Negative for shortness of breath and wheezing.   Cardiovascular: Negative for chest pain, palpitations and leg swelling.  Gastrointestinal: Positive for diarrhea. Negative for abdominal pain, nausea and vomiting.  Genitourinary: Negative for dysuria.  Musculoskeletal: Positive for myalgias.  Skin: Negative for rash.  Neurological: Positive for headaches. Negative for loss of consciousness and weakness.  Psychiatric/Behavioral: The patient is not nervous/anxious.    See HPI for history of present illness.  Physical Exam Constitutional:      General:  He is not in acute distress.    Appearance: Normal appearance. He is well-developed. He is not ill-appearing.  HENT:     Head: Normocephalic and atraumatic.     Nose: Nose normal.  Eyes:     General: No scleral icterus.    Conjunctiva/sclera: Conjunctivae normal.  Pulmonary:     Effort: Pulmonary effort is normal.     Comments: Dry cough Speaks in full sentences No accessory muscle usage Musculoskeletal:        General: Normal range of motion.     Cervical back: Normal range of motion.  Skin:    Coloration: Skin is not pale.  Neurological:     General: No focal deficit present.     Mental Status: He is alert.  Psychiatric:        Mood and Affect: Mood normal.               A&P  1. COVID-19  - continue home therapies  - Molnupiravir and tessalon  - f/u with PCP or in ER if symptoms worsen   Patient voiced understanding and agreement to plan.   Time:   Today, I have spent 15 minutes with the patient with telehealth technology discussing the above problems, reviewing the chart, previous notes, medications and  orders.    Tests Ordered: No orders of the defined types were placed in this encounter.   Medication Changes: Meds ordered this encounter  Medications  . DISCONTD: nirmatrelvir/ritonavir EUA (PAXLOVID) TABS    Sig: Take nirmatrelvir (150 mg) two tablets twice daily for 5 days and ritonavir (100 mg) one tablet twice daily for 5 days.    Dispense:  30 tablet    Refill:  0  . benzonatate (TESSALON PERLES) 100 MG capsule    Sig: Take 1 capsule (100 mg total) by mouth 3 (three) times daily as needed for cough (cough).    Dispense:  20 capsule    Refill:  0  . molnupiravir EUA 200 mg CAPS    Sig: Take 4 capsules (800 mg total) by mouth 2 (two) times daily for 5 days.    Dispense:  40 capsule    Refill:  0     Disposition:  Follow up with PCP as needed or in ER if symptoms worsen  Signed, Abigail Butts, PA-C  08/18/2020 9:16 AM

## 2020-08-18 NOTE — Patient Instructions (Addendum)
  1. COVID-19  - continue home therapies  - Molnupiravir (instead of Paxlovid because of your kidney problems) and tessalon  - f/u with PCP or in ER if symptoms worsen

## 2020-08-18 NOTE — Telephone Encounter (Signed)
Called pt's wife to inform them we have schedule telehealth appts for him and his wife on Thursday June 2 (tested covid positive) . Stated they need to be seen sooner; no appts available. Stated they will call  [facility]. Appts cancelled per their request.

## 2020-08-20 ENCOUNTER — Encounter: Payer: Self-pay | Admitting: Internal Medicine

## 2020-08-26 ENCOUNTER — Encounter: Payer: BC Managed Care – PPO | Admitting: Student

## 2020-08-27 ENCOUNTER — Encounter: Payer: Self-pay | Admitting: Student

## 2020-08-27 ENCOUNTER — Ambulatory Visit (INDEPENDENT_AMBULATORY_CARE_PROVIDER_SITE_OTHER): Payer: Self-pay | Admitting: Student

## 2020-08-27 ENCOUNTER — Other Ambulatory Visit (HOSPITAL_COMMUNITY): Payer: Self-pay

## 2020-08-27 ENCOUNTER — Other Ambulatory Visit: Payer: Self-pay

## 2020-08-27 VITALS — BP 150/103 | HR 102 | Temp 98.9°F | Ht 67.0 in | Wt 193.7 lb

## 2020-08-27 DIAGNOSIS — E1169 Type 2 diabetes mellitus with other specified complication: Secondary | ICD-10-CM

## 2020-08-27 DIAGNOSIS — E785 Hyperlipidemia, unspecified: Secondary | ICD-10-CM

## 2020-08-27 DIAGNOSIS — I10 Essential (primary) hypertension: Secondary | ICD-10-CM

## 2020-08-27 LAB — POCT GLYCOSYLATED HEMOGLOBIN (HGB A1C): Hemoglobin A1C: 6.4 % — AB (ref 4.0–5.6)

## 2020-08-27 LAB — GLUCOSE, CAPILLARY: Glucose-Capillary: 116 mg/dL — ABNORMAL HIGH (ref 70–99)

## 2020-08-27 MED ORDER — LOSARTAN POTASSIUM 100 MG PO TABS
100.0000 mg | ORAL_TABLET | Freq: Every day | ORAL | 3 refills | Status: AC
  Filled 2020-08-27: qty 30, 30d supply, fill #0
  Filled 2020-09-28: qty 30, 30d supply, fill #1
  Filled 2020-12-29: qty 30, 30d supply, fill #2

## 2020-08-27 MED ORDER — LOSARTAN POTASSIUM 100 MG PO TABS
100.0000 mg | ORAL_TABLET | Freq: Every day | ORAL | 3 refills | Status: DC
  Filled 2020-08-27: qty 90, 90d supply, fill #0

## 2020-08-27 NOTE — Progress Notes (Signed)
   CC: annual follow-up  HPI:  [name] is a 55 y.o. with past medical history of hypertension, hyperlipidemia and type 2 diabetes mellitus who presents to clinic for annual follow-up of chronic medical conditions and to discuss dual citizenship paperwork. Refer to problem list for further charting of this encounter.  Past Medical History:  Diagnosis Date   Abnormality of gait    Allergy    seasonal   Asthma    Congenital pes planus    Diabetes mellitus without complication ([place])    diagnosed 2015   Esophageal reflux    OSA on CPAP    Pain in joint, lower leg    Sarcoidosis    Sleep apnea    uses cpap   Tear of medial cartilage or meniscus of knee, current    Unspecified essential hypertension    Review of Systems: Denies chest pain, shortness of breath, cough, fevers, chills, abdominal pain, nausea, vomiting, diarrhea.  Physical Exam:  Vitals:   08/27/20 1351  BP: (!) 150/103  Pulse: (!) 102  Temp: 98.9 F (37.2 C)  TempSrc: Oral  SpO2: 100%  Weight: 193 lb 11.2 oz (87.9 kg)  Height: 5\' 7"  (1.702 m)   Physical Exam Constitutional:      Comments: Well-appearing man sitting in examination chair in no acute distress  Cardiovascular:     Comments: Mildly tachycardic rate with regular rhythm. No appreciable mumurs, rubs or gallops Pulmonary:     Comments: Patient breathing comfortably on room air with lungs clear to auscultation with no increased respiratory effort. No rales, wheezes, rhonci Abdominal:     Comments: Abdomen is soft, nondistended, nontender with normal bowel sounds  Musculoskeletal:     Comments: There is no appreciable swelling or edema of the bilateral lower extremities. Patient has full range of motion  Skin:    Comments: No obvious rashes or lesions of the skin  Neurological:     Comments: Patient is alert and oriented with intact sensation and strength with no gross neuro deficits  Psychiatric:     Comments: Patient's mood,  behavior, attention, thought content and judgment are normal    Assessment & Plan:   Collin Lopez is a pleasantm 55 year old man living with diet controlled type 2 diabetes mellitus, hypertension on two antihypertensive regimen, diet controlled hyperlipidemia, mild asthma and seasonal allergies who presents to clinic for annual visit as well as completing medical examination for citizenship in [place]. Patient's physical examination is unremarkable for any acute abnormality. Patient's current medical evaluation warrants evaluation with basic metabolic panel and lipid profile, although further urine, stool and serologic testing is not clinically indicated at this time. Patient does not require repeat chest x-ray at this time.  See Encounters Tab for problem based charting.  Patient discussed with Dr.  Jimmye Norman

## 2020-08-27 NOTE — Assessment & Plan Note (Addendum)
Patient states that he has discontinued his atorvastatin in favor of consuming olive oil with avocado as a means to control his cholesterol. Patient interested in checking lipid profile today to determine need for treatment -Obtain lipid profile -Will discuss with patient treatment options pending results  ADDENDUM: Patient's ASCVD risk calculator 32.9% -Contacted patient by telephone to recommend starting rosuvastatin 20mg  daily which he agrees

## 2020-08-27 NOTE — Patient Instructions (Signed)
Collin Lopez,  It was a pleasure seeing you in clinic today.  For your dual citizenship forms, I attempted to contact the embassy however nobody answered. I would recommend sending in the current documents that you have. If the embassy notifies you that they need additional documents, laboratory tests or imaging, we would be happy to order the necessary studies for you to complete.  For your hypertension (high blood pressure): Please take hydrochlorothiazide 25mg  daily and start taking a full tablet of losartan 100mg  daily.  For your diabetes (high blood sugars): Continue working on your diet, exercise and weight loss as this condition is stable without the need for medication.  For your hyperlipidemia (high cholesterol): We will collect a lipid profile today to determine whether or not you need medication to control this chronic condition.  Sincerely, Dr. Paulla Dolly, MD

## 2020-08-27 NOTE — Assessment & Plan Note (Signed)
Patient's HbA1c remains at 6.4 consistent with November of 2020. Patient has been controlling this chronic condition with lifestyle modifications. -Continue with diet control of this condition

## 2020-08-27 NOTE — Assessment & Plan Note (Addendum)
Patient's blood pressure elevated to 150/103. His current antihypertensive regimen includes HCTZ 25mg  daily and losartan 50mg  daily.  -Increase losartan 100mg  daily -Continue HCTZ 25mg  daily -BMP today

## 2020-08-28 ENCOUNTER — Other Ambulatory Visit (HOSPITAL_COMMUNITY): Payer: Self-pay

## 2020-08-28 LAB — LIPID PANEL
Chol/HDL Ratio: 7.6 ratio — ABNORMAL HIGH (ref 0.0–5.0)
Cholesterol, Total: 229 mg/dL — ABNORMAL HIGH (ref 100–199)
HDL: 30 mg/dL — ABNORMAL LOW (ref >39)
LDL Chol Calc (NIH): 129 mg/dL — ABNORMAL HIGH (ref 0–99)
Triglycerides: 391 mg/dL — ABNORMAL HIGH (ref 0–149)
VLDL Cholesterol Cal: 70 mg/dL — ABNORMAL HIGH (ref 5–40)

## 2020-08-28 LAB — BMP8+ANION GAP
Anion Gap: 17 mmol/L (ref 10.0–18.0)
BUN/Creatinine Ratio: 14 (ref 9–20)
BUN: 16 mg/dL (ref 6–24)
CO2: 25 mmol/L (ref 20–29)
Calcium: 10.3 mg/dL — ABNORMAL HIGH (ref 8.7–10.2)
Chloride: 101 mmol/L (ref 96–106)
Creatinine, Ser: 1.12 mg/dL (ref 0.76–1.27)
Glucose: 90 mg/dL (ref 65–99)
Potassium: 4 mmol/L (ref 3.5–5.2)
Sodium: 143 mmol/L (ref 134–144)
eGFR: 77 mL/min/{1.73_m2} (ref >59)

## 2020-08-28 MED ORDER — ROSUVASTATIN CALCIUM 20 MG PO TABS
20.0000 mg | ORAL_TABLET | Freq: Every day | ORAL | 11 refills | Status: AC
  Filled 2020-08-28: qty 30, 30d supply, fill #0
  Filled 2020-09-28: qty 30, 30d supply, fill #1
  Filled 2020-11-10: qty 30, 30d supply, fill #2
  Filled 2021-01-15: qty 30, 30d supply, fill #3
  Filled 2021-07-23: qty 30, 30d supply, fill #4

## 2020-08-28 NOTE — Progress Notes (Signed)
Internal Medicine Clinic Attending  Case discussed with Dr. Johnson  At the time of the visit.  We reviewed the resident's history and exam and pertinent patient test results.  I agree with the assessment, diagnosis, and plan of care documented in the resident's note.  

## 2020-08-28 NOTE — Addendum Note (Signed)
Addended by: Paulla Dolly on: 08/28/2020 01:14 PM   Modules accepted: Orders

## 2020-09-22 ENCOUNTER — Encounter: Payer: Self-pay | Admitting: *Deleted

## 2020-09-28 ENCOUNTER — Ambulatory Visit (HOSPITAL_COMMUNITY)
Admission: RE | Admit: 2020-09-28 | Discharge: 2020-09-28 | Disposition: A | Discharge status: Home / Self Care | Payer: Self-pay | Source: Ambulatory Visit | Attending: Internal Medicine | Admitting: Internal Medicine

## 2020-09-28 ENCOUNTER — Ambulatory Visit: Payer: Self-pay | Admitting: Student

## 2020-09-28 ENCOUNTER — Other Ambulatory Visit: Payer: Self-pay

## 2020-09-28 ENCOUNTER — Other Ambulatory Visit (HOSPITAL_COMMUNITY): Payer: Self-pay

## 2020-09-28 VITALS — BP 142/79 | HR 87 | Temp 98.2°F | Ht 67.0 in | Wt 194.3 lb

## 2020-09-28 DIAGNOSIS — Z0289 Encounter for other administrative examinations: Secondary | ICD-10-CM

## 2020-09-28 DIAGNOSIS — E1169 Type 2 diabetes mellitus with other specified complication: Secondary | ICD-10-CM

## 2020-09-28 DIAGNOSIS — Z0000 Encounter for general adult medical examination without abnormal findings: Secondary | ICD-10-CM

## 2020-09-28 NOTE — Patient Instructions (Signed)
Thank you, [name] for allowing [place] to provide your care today. Today we discussed   Immigration requirements Today we discussed her immigration requirements.  We will be collecting stool sample as well as urine sample.  When you return the stool sample we will take blood samples to send off.  Please also stop by radiology to get a chest x-ray.  When you stop by and see them please ask for a disc copy.  We will also assist you with any other paperwork you need.  Just please call our office and we will help you as we can.     I have ordered the following labs for you:   Lab Orders  Ova and parasite examination  Fecal occult blood, imunochemical  Urinalysis, Reflex Microscopic  ToxAssure Select,+Antidepr,UR     Referrals ordered today:   Referral Orders  No referral(s) requested today     I have ordered the following medication/changed the following medications:   Stop the following medications: There are no discontinued medications.   Start the following medications: No orders of the defined types were placed in this encounter.    Follow up: For blood work when you bring in your stool samples   Should you have any questions or concerns please call the internal medicine clinic at [phone].     Collin Lopez, D.O. [place]

## 2020-09-28 NOTE — Progress Notes (Signed)
CC: HTN, DM  HPI:  [name] is a 55 y.o. male with a past medical history stated below and presents today for discussion of his immigration paperwork. Patient is currently attempting to move to the [place] where his wife is from.  Please see problem based assessment and plan for additional details.  Past Medical History:  Diagnosis Date   Abnormality of gait    Allergy    seasonal   Asthma    Congenital pes planus    Diabetes mellitus without complication ([place])    diagnosed 07-06-13   Esophageal reflux    OSA on CPAP    Pain in joint, lower leg    Sarcoidosis    Sleep apnea    uses cpap   Tear of medial cartilage or meniscus of knee, current    Unspecified essential hypertension     Current Outpatient Medications on File Prior to Visit  Medication Sig Dispense Refill   acetaminophen (TYLENOL) 500 MG tablet Take 2 tablets (1,000 mg total) by mouth every 8 (eight) hours as needed. 30 tablet 0   albuterol (PROAIR HFA) 108 (90 BASE) MCG/ACT inhaler Inhale 2 puffs into the lungs every 6 (six) hours as needed for wheezing or shortness of breath. For shortness of breath 1 Inhaler 11   aspirin 81 MG tablet Take 1 tablet (81 mg total) by mouth daily. 30 tablet    azelastine (ASTELIN) 0.1 % nasal spray Place 2 sprays into both nostrils 2 (two) times daily. 30 mL 5   benzonatate (TESSALON PERLES) 100 MG capsule Take 1 capsule (100 mg total) by mouth 3 (three) times daily as needed for cough (cough). 20 capsule 0   hydrochlorothiazide (HYDRODIURIL) 25 MG tablet TAKE 1 TABLET BY MOUTH EVERY DAY 90 tablet 2   ibuprofen (ADVIL,MOTRIN) 200 MG tablet Take 400 mg by mouth every 6 (six) hours as needed for moderate pain. Reported on 10/07/2015     Loratadine 10 MG CAPS Take 1 capsule (10 mg total) by mouth daily. 30 capsule 0   losartan (COZAAR) 100 MG tablet Take 1 tablet (100 mg total) by mouth daily. 90 tablet 3   oxymetazoline (AFRIN) 0.05 % nasal spray Place 1 spray into both  nostrils 2 (two) times daily. 30 mL 0   rosuvastatin (CRESTOR) 20 MG tablet Take 1 tablet (20 mg total) by mouth daily. 30 tablet 11   No current facility-administered medications on file prior to visit.    Family History  Problem Relation Age of Onset   Heart disease Father        Died 3 years ago due to CHF and refused pacemaker.   Cancer Mother        breast cancer passed away in July 06, 2009.   Heart attack Brother    Colon cancer Neg Hx    Rectal cancer Neg Hx    Stomach cancer Neg Hx     Social History   Socioeconomic History   Marital status: Married    Spouse name: Navin Dogan   Number of children: Not on file   Years of education: 10   Highest education level: Not on file  Occupational History   Occupation: Child psychotherapist: [employer]    Employer: [employer]  Tobacco Use   Smoking status: Former    Packs/day: 0.20    Years: 3.00    Pack years: 0.60    Types: Cigarettes    Quit date: 03/21/1993  Years since quitting: 27.5   Smokeless tobacco: Never   Tobacco comments:    1ppd x 2 years  Vaping Use   Vaping Use: Never used  Substance and Sexual Activity   Alcohol use: No    Alcohol/week: 0.0 standard drinks   Drug use: No   Sexual activity: Yes    Partners: Female  Other Topics Concern   Not on file  Social History Narrative   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         Social Determinants of Health   Financial Resource Strain: Not on file  Food Insecurity: Not on file  Transportation Needs: Not on file  Physical Activity: Not on file  Stress: Not on file  Social Connections: Not on file  Intimate Partner Violence: Not on file    Review of Systems: ROS negative except for what is noted on the assessment and plan.  Vitals:   09/28/20 1049  BP: (!) 142/79  Pulse: 87  Temp: 98.2 F (36.8 C)  TempSrc: Oral  SpO2: 100%  Weight: 194 lb 4.8 oz (88.1 kg)  Height: 5\' 7"  (1.702 m)     Physical  Exam: Constitutional: Well-appearing, no acute distress HENT: normocephalic atraumatic Eyes: conjunctiva non-erythematous Neck: supple Cardiovascular: regular rate Pulmonary/Chest: normal work of breathing on room air MSK: normal bulk and tone Neurological: alert & oriented x 3 Skin: dry Psych: Normal mood and thought process   Assessment & Plan:   See Encounters Tab for problem based charting.  Patient discussed with Dr. Caffie Damme, D.O. [facility], PGY-2 Pager: [phone], Phone: [phone] Date 09/28/2020 Time 12:50 PM

## 2020-09-28 NOTE — Assessment & Plan Note (Signed)
Assessment: Collin Lopez presents to the clinic today for further assistance filling out immigration paperwork.  He is attempting to permanently move back to the [place], where his wife is from.  He is lives there periodically for months at a time past few years.  The paperwork he brings and requires a physical exam, lab work of ova, parasite stool sample as well as fecal occult stool sample.  And also request a tox assure and urinalysis.  Serological testing listed as complete serology and metabolic panel.  Also mentioned his HIV testing.  The last requirement is a chest x-ray.  It is highly recommended that the chest x-ray be uploaded to his CD and given to the patient.  We will order all the tests above and assist him in filling out the paperwork needed.  Plan: -Send patient home with stool sample kit to collect for ova/parasite, fecal occult testing. -Urine detained for tox assure as well as urinalysis. -When patient brings back and stool sample will collect sample for CBC, CMP, HIV -Patient to head to radiology after his appointment and have chest x-ray performed

## 2020-09-29 LAB — URINALYSIS, ROUTINE W REFLEX MICROSCOPIC
Bilirubin, UA: NEGATIVE
Glucose, UA: NEGATIVE
Ketones, UA: NEGATIVE
Leukocytes,UA: NEGATIVE
Nitrite, UA: NEGATIVE
RBC, UA: NEGATIVE
Specific Gravity, UA: 1.012 (ref 1.005–1.030)
Urobilinogen, Ur: 0.2 mg/dL (ref 0.2–1.0)
pH, UA: 5.5 (ref 5.0–7.5)

## 2020-09-29 LAB — MICROSCOPIC EXAMINATION
Bacteria, UA: NONE SEEN
Casts: NONE SEEN /lpf
Epithelial Cells (non renal): NONE SEEN /hpf (ref 0–10)
RBC, Urine: NONE SEEN /hpf (ref 0–2)
WBC, UA: NONE SEEN /hpf (ref 0–5)

## 2020-10-03 LAB — TOXASSURE SELECT,+ANTIDEPR,UR

## 2020-10-05 NOTE — Progress Notes (Signed)
Internal Medicine Clinic Attending  Case discussed with Dr. Katsadouros  At the time of the visit.  We reviewed the resident's history and exam and pertinent patient test results.  I agree with the assessment, diagnosis, and plan of care documented in the resident's note.  

## 2020-10-06 ENCOUNTER — Other Ambulatory Visit (INDEPENDENT_AMBULATORY_CARE_PROVIDER_SITE_OTHER): Payer: Self-pay

## 2020-10-06 DIAGNOSIS — Z0289 Encounter for other administrative examinations: Secondary | ICD-10-CM

## 2020-10-07 LAB — CBC
Hematocrit: 42.4 % (ref 37.5–51.0)
Hemoglobin: 14.1 g/dL (ref 13.0–17.7)
MCH: 29.6 pg (ref 26.6–33.0)
MCHC: 33.3 g/dL (ref 31.5–35.7)
MCV: 89 fL (ref 79–97)
Platelets: 328 10*3/uL (ref 150–450)
RBC: 4.77 x10E6/uL (ref 4.14–5.80)
RDW: 13.2 % (ref 11.6–15.4)
WBC: 7.9 10*3/uL (ref 3.4–10.8)

## 2020-10-07 LAB — FECAL OCCULT BLOOD, IMMUNOCHEMICAL: Fecal Occult Bld: NEGATIVE

## 2020-10-12 ENCOUNTER — Encounter: Payer: Self-pay | Admitting: Internal Medicine

## 2020-10-13 ENCOUNTER — Encounter: Payer: Self-pay | Admitting: Student

## 2020-10-13 LAB — OVA AND PARASITE EXAMINATION

## 2020-11-10 ENCOUNTER — Other Ambulatory Visit (HOSPITAL_COMMUNITY): Payer: Self-pay

## 2020-11-20 ENCOUNTER — Encounter: Payer: Self-pay | Admitting: Family Medicine

## 2020-11-20 ENCOUNTER — Ambulatory Visit
Admission: RE | Admit: 2020-11-20 | Discharge: 2020-11-20 | Disposition: A | Discharge status: Home / Self Care | Payer: Self-pay | Source: Ambulatory Visit | Attending: Family Medicine | Admitting: Family Medicine

## 2020-11-20 ENCOUNTER — Ambulatory Visit (INDEPENDENT_AMBULATORY_CARE_PROVIDER_SITE_OTHER): Payer: Self-pay | Admitting: Family Medicine

## 2020-11-20 ENCOUNTER — Other Ambulatory Visit: Payer: Self-pay

## 2020-11-20 VITALS — Ht 67.0 in | Wt 195.0 lb

## 2020-11-20 DIAGNOSIS — M25512 Pain in left shoulder: Secondary | ICD-10-CM

## 2020-11-20 MED ORDER — METHYLPREDNISOLONE ACETATE 40 MG/ML IJ SUSP
40.0000 mg | Freq: Once | INTRAMUSCULAR | Status: AC
  Administered 2020-11-20: 40 mg via INTRA_ARTICULAR

## 2020-11-20 NOTE — Patient Instructions (Signed)
Let me see you in a couple of weeks

## 2020-11-20 NOTE — Assessment & Plan Note (Signed)
Recurrence of rotator cuff syndrome left shoulder.  I am a little concerned he is at risk for adhesive capsulitis as he is really starting to guard this shoulder.  He asked me about using a brace or splint and I told him I would advise against that, in fact I would want him to do more range of motion.  We did corticosteroid injection today.  I like to see him back in 2 to 4 weeks to make sure that he is not progressing to adhesive capsulitis.

## 2020-11-20 NOTE — Progress Notes (Signed)
  Collin Lopez - 55 y.o. male MRN [id number]  Date of birth: 20-Jul-1965    SUBJECTIVE:      Chief Complaint:/ HPI:  Left shoulder pain.  Has had this before.  Has been having problems with this actually since 2016.  In the last month or so it has gotten significantly worse so that he is having to offload some activities to the right hand.  He is right-hand dominant.  He admits to not doing his exercise program regularly over the last year or so.  Has had great improvement with injection before.    OBJECTIVE: Ht '5\' 7"'$  (1.702 m)   Wt 195 lb (88.5 kg)   BMI 30.54 kg/m   Physical Exam:  Vital signs are reviewed. GENERAL: Well-developed male no acute distress SHOULDERS: Symmetrical.  Both shoulders have intact strength in all planes the rotator cuff.  The left shoulder has full range of passive motion but active motion above 90 degrees abduction or forward flexion is painful.  Area over the biceps tendon is nontender. NEURO: Intact sensation to soft touch bilateral hands.  PROCEDURE: INJECTION: Patient was given informed consent, signed copy in the chart. Appropriate time out was taken. Area prepped and draped in usual sterile fashion. Ethyl chloride was  used for local anesthesia. A 21 gauge 1 1/2 inch needle was used..  1 cc of methylprednisolone 40 mg/ml plus 4 cc of 1% lidocaine without epinephrine was injected into the left shoulder using a(n) posterior approach.   The patient tolerated the procedure well. There were no complications. Post procedure instructions were given.   ASSESSMENT & PLAN:  See problem based charting & AVS for pt instructions. Pain in joint of left shoulder Recurrence of rotator cuff syndrome left shoulder.  I am a little concerned he is at risk for adhesive capsulitis as he is really starting to guard this shoulder.  He asked me about using a brace or splint and I told him I would advise against that, in fact I would want him to do more range of motion.  We did  corticosteroid injection today.  I like to see him back in 2 to 4 weeks to make sure that he is not progressing to adhesive capsulitis.

## 2020-11-25 ENCOUNTER — Encounter: Payer: Self-pay | Admitting: Family Medicine

## 2020-12-27 ENCOUNTER — Encounter: Payer: Self-pay | Admitting: Internal Medicine

## 2020-12-28 ENCOUNTER — Encounter: Payer: Self-pay | Admitting: Gastroenterology

## 2020-12-29 ENCOUNTER — Other Ambulatory Visit (HOSPITAL_COMMUNITY): Payer: Self-pay

## 2021-01-15 ENCOUNTER — Other Ambulatory Visit (HOSPITAL_COMMUNITY): Payer: Self-pay

## 2021-01-18 ENCOUNTER — Encounter: Payer: Self-pay | Admitting: Internal Medicine

## 2021-01-19 ENCOUNTER — Other Ambulatory Visit (HOSPITAL_COMMUNITY): Payer: Self-pay

## 2021-01-19 ENCOUNTER — Other Ambulatory Visit: Payer: Self-pay | Admitting: Internal Medicine

## 2021-01-19 DIAGNOSIS — I10 Essential (primary) hypertension: Secondary | ICD-10-CM

## 2021-01-19 MED ORDER — HYDROCHLOROTHIAZIDE 25 MG PO TABS
25.0000 mg | ORAL_TABLET | Freq: Every day | ORAL | 2 refills | Status: AC
  Filled 2021-01-19: qty 30, 30d supply, fill #0
  Filled 2021-07-23: qty 30, 30d supply, fill #1

## 2021-01-20 ENCOUNTER — Other Ambulatory Visit (HOSPITAL_COMMUNITY): Payer: Self-pay

## 2021-04-27 ENCOUNTER — Encounter: Payer: Self-pay | Admitting: Gastroenterology

## 2021-05-24 ENCOUNTER — Other Ambulatory Visit (HOSPITAL_COMMUNITY): Payer: Self-pay

## 2021-05-24 ENCOUNTER — Ambulatory Visit (AMBULATORY_SURGERY_CENTER): Payer: BC Managed Care – PPO | Admitting: *Deleted

## 2021-05-24 ENCOUNTER — Other Ambulatory Visit: Payer: Self-pay

## 2021-05-24 VITALS — Ht 67.0 in | Wt 192.0 lb

## 2021-05-24 DIAGNOSIS — Z86010 Personal history of colonic polyps: Secondary | ICD-10-CM

## 2021-05-24 MED ORDER — NA SULFATE-K SULFATE-MG SULF 17.5-3.13-1.6 GM/177ML PO SOLN
1.0000 | Freq: Once | ORAL | 0 refills | Status: AC
  Filled 2021-05-24: qty 354, 1d supply, fill #0

## 2021-05-24 NOTE — Progress Notes (Signed)

## 2021-05-26 ENCOUNTER — Encounter: Payer: Self-pay | Admitting: Gastroenterology

## 2021-06-07 ENCOUNTER — Ambulatory Visit (AMBULATORY_SURGERY_CENTER): Payer: BC Managed Care – PPO | Admitting: Gastroenterology

## 2021-06-07 ENCOUNTER — Encounter: Payer: Self-pay | Admitting: Gastroenterology

## 2021-06-07 ENCOUNTER — Other Ambulatory Visit: Payer: Self-pay | Admitting: Gastroenterology

## 2021-06-07 VITALS — BP 145/86 | HR 85 | Temp 98.6°F | Resp 22 | Ht 67.0 in | Wt 192.0 lb

## 2021-06-07 DIAGNOSIS — D123 Benign neoplasm of transverse colon: Secondary | ICD-10-CM

## 2021-06-07 DIAGNOSIS — D122 Benign neoplasm of ascending colon: Secondary | ICD-10-CM

## 2021-06-07 DIAGNOSIS — Z86010 Personal history of colonic polyps: Secondary | ICD-10-CM

## 2021-06-07 MED ORDER — SODIUM CHLORIDE 0.9 % IV SOLN: 500.0000 mL | Freq: Once | INTRAVENOUS | Status: DC

## 2021-06-07 NOTE — Op Note (Signed)
[place] ?Patient Name: Collin Lopez ?Procedure Date: 06/07/2021 2:01 PM ?MRN: [id number] ?Endoscopist: Milus Banister , MD ?Age: 56 ?Referring MD:  ?Date of Birth: May 13, 1965 ?Gender: Male ?Account #: [id number] ?Procedure:                Colonoscopy ?Indications:              High risk colon cancer surveillance: Personal  ?                          history of colonic polyps; Colonoscopy 2015 two  ?                          subCM adenomas ?Medicines:                Monitored Anesthesia Care ?Procedure:                Pre-Anesthesia Assessment: ?                          - Prior to the procedure, a History and Physical  ?                          was performed, and patient medications and  ?                          allergies were reviewed. The patient's tolerance of  ?                          previous anesthesia was also reviewed. The risks  ?                          and benefits of the procedure and the sedation  ?                          options and risks were discussed with the patient.  ?                          All questions were answered, and informed consent  ?                          was obtained. Prior Anticoagulants: The patient has  ?                          taken no previous anticoagulant or antiplatelet  ?                          agents. ASA Grade Assessment: II - A patient with  ?                          mild systemic disease. After reviewing the risks  ?                          and benefits, the patient was deemed in  ?  satisfactory condition to undergo the procedure. ?                          After obtaining informed consent, the colonoscope  ?                          was passed under direct vision. Throughout the  ?                          procedure, the patient's blood pressure, pulse, and  ?                          oxygen saturations were monitored continuously. The  ?                          CF [id number] #[id number] was introduced through the anus   ?                          and advanced to the the cecum, identified by  ?                          appendiceal orifice and ileocecal valve. The  ?                          colonoscopy was performed without difficulty. The  ?                          patient tolerated the procedure well. The quality  ?                          of the bowel preparation was good. The ileocecal  ?                          valve, appendiceal orifice, and rectum were  ?                          photographed. ?Scope In: 2:06:29 PM ?Scope Out: 2:20:20 PM ?Scope Withdrawal Time: 0 hours 10 minutes 16 seconds  ?Total Procedure Duration: 0 hours 13 minutes 51 seconds  ?Findings:                 Two sessile polyps were found in the transverse  ?                          colon and ascending colon. The polyps were 2 to 4  ?                          mm in size. These polyps were removed with a cold  ?                          snare. Resection and retrieval were complete. ?                          Multiple small and large-mouthed diverticula were  ?  found in the left colon. ?                          The exam was otherwise without abnormality on  ?                          direct and retroflexion views. ?Complications:            No immediate complications. Estimated blood loss:  ?                          None. ?Estimated Blood Loss:     Estimated blood loss: none. ?Impression:               - Two 2 to 4 mm polyps in the transverse colon and  ?                          in the ascending colon, removed with a cold snare.  ?                          Resected and retrieved. ?                          - Diverticulosis in the left colon. ?                          - The examination was otherwise normal on direct  ?                          and retroflexion views. ?Recommendation:           - Patient has a contact number available for  ?                          emergencies. The signs and symptoms of potential  ?                           delayed complications were discussed with the  ?                          patient. Return to normal activities tomorrow.  ?                          Written discharge instructions were provided to the  ?                          patient. ?                          - Resume previous diet. ?                          - Continue present medications. ?                          - Await pathology results. ?Milus Banister, MD ?06/07/2021 2:22:36 PM ?This report has been signed electronically. ?

## 2021-06-07 NOTE — Progress Notes (Signed)
HPI: ?This is a man with h/o polyps ? ?Colonoscopy 2013/06/25 two subCM adenomas ? ? ?ROS: complete GI ROS as described in HPI, all other review negative. ? ?Constitutional:  No unintentional weight loss ? ? ?Past Medical History:  ?Diagnosis Date  ? Abnormality of gait   ? Allergy   ? seasonal  ? Anemia   ? " many years ago but fine now".  ? Arthritis   ? shoulder left  ? Asthma   ? Congenital pes planus   ? Diabetes mellitus without complication ([place])   ? diagnosed Jun 25, 2013  ? Esophageal reflux   ? Hyperlipidemia   ? Irregular heart beat   ? "skipping beats few years ago, i am find now".  ? OSA on CPAP   ? no longer waer updated 05/24/21  ? Pain in joint, lower leg   ? Sarcoidosis   ? Sleep apnea   ? uses cpap  ? Tear of medial cartilage or meniscus of knee, current   ? Unspecified essential hypertension   ? ? ?Past Surgical History:  ?Procedure Laterality Date  ? KNEE SURGERY    ? left knee repair for medical meniscus tear  ? NOSE SURGERY  1985  ? ? ?Current Outpatient Medications  ?Medication Sig Dispense Refill  ? aspirin 81 MG tablet Take 1 tablet (81 mg total) by mouth daily. 30 tablet   ? azelastine (ASTELIN) 0.1 % nasal spray Place 2 sprays into both nostrils 2 (two) times daily. 30 mL 5  ? hydrochlorothiazide (HYDRODIURIL) 25 MG tablet Take 1 tablet (25 mg total) by mouth daily. 30 tablet 2  ? Loratadine 10 MG CAPS Take 1 capsule (10 mg total) by mouth daily. 30 capsule 0  ? losartan (COZAAR) 100 MG tablet Take 1 tablet (100 mg total) by mouth daily. 90 tablet 3  ? rosuvastatin (CRESTOR) 20 MG tablet Take 1 tablet (20 mg total) by mouth daily. 30 tablet 11  ? acetaminophen (TYLENOL) 500 MG tablet Take 2 tablets (1,000 mg total) by mouth every 8 (eight) hours as needed. 30 tablet 0  ? albuterol (PROAIR HFA) 108 (90 BASE) MCG/ACT inhaler Inhale 2 puffs into the lungs every 6 (six) hours as needed for wheezing or shortness of breath. For shortness of breath (Patient not taking: Reported on 05/24/2021) 1 Inhaler 11  ?  oxymetazoline (AFRIN) 0.05 % nasal spray Place 1 spray into both nostrils 2 (two) times daily. 30 mL 0  ? ?Current Facility-Administered Medications  ?Medication Dose Route Frequency Provider Last Rate Last Admin  ? 0.9 %  sodium chloride infusion  500 mL Intravenous Once Milus Banister, MD      ? ? ?Allergies as of 06/07/2021 - Review Complete 06/07/2021  ?Allergen Reaction Noted  ? Tramadol Nausea And Vomiting 12/13/2014  ? ? ?Family History  ?Problem Relation Age of Onset  ? Breast cancer Mother   ? Cancer Mother   ?     breast cancer passed away in 25-Jun-2009.  ? Heart disease Father   ?     Died 3 years ago due to CHF and refused pacemaker.  ? Heart attack Brother   ? Colon cancer Neg Hx   ? Rectal cancer Neg Hx   ? Stomach cancer Neg Hx   ? Colon polyps Neg Hx   ? Esophageal cancer Neg Hx   ? ? ?Social History  ? ?Socioeconomic History  ? Marital status: Married  ?  Spouse name: Rosemary Pentecost  ? Number of  children: Not on file  ? Years of education: 10  ? Highest education level: Not on file  ?Occupational History  ? Occupation: Environmental services  ?  Employer: [employer]  ?  Employer: [employer]  ?Tobacco Use  ? Smoking status: Former  ?  Packs/day: 0.20  ?  Years: 3.00  ?  Pack years: 0.60  ?  Types: Cigarettes  ?  Quit date: 03/21/1993  ?  Years since quitting: 28.2  ?  Passive exposure: Never  ? Smokeless tobacco: Never  ? Tobacco comments:  ?  1ppd x 2 years  ?Vaping Use  ? Vaping Use: Never used  ?Substance and Sexual Activity  ? Alcohol use: No  ?  Alcohol/week: 0.0 standard drinks  ? Drug use: No  ? Sexual activity: Yes  ?  Partners: Female  ?Other Topics Concern  ? Not on file  ?Social History Narrative  ? 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  ?   ?   ? ?Social Determinants of Health  ? ?Financial Resource Strain: Not on file  ?Food Insecurity: Not on file  ?Transportation Needs: Not on file  ?Physical Activity: Not on file  ?Stress: Not on file  ?Social Connections: Not on  file  ?Intimate Partner Violence: Not on file  ? ? ? ?Physical Exam: ?BP (!) 158/97   Pulse 85   Temp 98.6 ?F (37 ?C)   Ht '5\' 7"'$  (1.702 m)   Wt 192 lb (87.1 kg)   SpO2 98%   BMI 30.07 kg/m?  ?Constitutional: generally well-appearing ?Psychiatric: alert and oriented x3 ?Lungs: CTA bilaterally ?Heart: no MCR ? ?Assessment and plan: ?56 y.o. male with h/o colon polyps ? ?Surveillance colonoscopy today ? ?Care is appropriate for the ambulatory setting. ? ?Owens Loffler, MD ?[facility] ?06/07/2021, 1:55 PM ? ? ? ?

## 2021-06-07 NOTE — Progress Notes (Signed)
Called to room to assist during endoscopic procedure.  Patient ID and intended procedure confirmed with present staff. Received instructions for my participation in the procedure from the performing physician.  

## 2021-06-07 NOTE — Patient Instructions (Signed)
Read all of the handouts given to you by your recovery room nurse. ? ?YOU HAD AN ENDOSCOPIC PROCEDURE TODAY AT THE [facility]:   Refer to the procedure report that was given to you for any specific questions about what was found during the examination.  If the procedure report does not answer your questions, please call your gastroenterologist to clarify.  If you requested that your care partner not be given the details of your procedure findings, then the procedure report has been included in a sealed envelope for you to review at your convenience later. ? ?YOU SHOULD EXPECT: Some feelings of bloating in the abdomen. Passage of more gas than usual.  Walking can help get rid of the air that was put into your GI tract during the procedure and reduce the bloating. If you had a lower endoscopy (such as a colonoscopy or flexible sigmoidoscopy) you may notice spotting of blood in your stool or on the toilet paper. If you underwent a bowel prep for your procedure, you may not have a normal bowel movement for a few days. ? ?Please Note:  You might notice some irritation and congestion in your nose or some drainage.  This is from the oxygen used during your procedure.  There is no need for concern and it should clear up in a day or so. ? ?SYMPTOMS TO REPORT IMMEDIATELY: ? ?Following lower endoscopy (colonoscopy or flexible sigmoidoscopy): ? Excessive amounts of blood in the stool ? Significant tenderness or worsening of abdominal pains ? Swelling of the abdomen that is new, acute ? Fever of 100?F or higher ? ?  ?For urgent or emergent issues, a gastroenterologist can be reached at any hour by calling [phone]. ?Do not use MyChart messaging for urgent concerns.  ? ? ?DIET:  We do recommend a small meal at first, but then you may proceed to your regular diet.  Drink plenty of fluids but you should avoid alcoholic beverages for 24 hours. ? ?ACTIVITY:  You should plan to take it easy for the rest of today  and you should NOT DRIVE or use heavy machinery until tomorrow (because of the sedation medicines used during the test).   ? ?FOLLOW UP: ?Our staff will call the number listed on your records 48-72 hours following your procedure to check on you and address any questions or concerns that you may have regarding the information given to you following your procedure. If we do not reach you, we will leave a message.  We will attempt to reach you two times.  During this call, we will ask if you have developed any symptoms of COVID 19. If you develop any symptoms (ie: fever, flu-like symptoms, shortness of breath, cough etc.) before then, please call [phone].  If you test positive for Covid 19 in the 2 weeks post procedure, please call and report this information to us.   ? ?If any biopsies were taken you will be contacted by phone or by letter within the next 1-3 weeks.  Please call us at [phone] if you have not heard about the biopsies in 3 weeks.  ? ? ?SIGNATURES/CONFIDENTIALITY: ?You and/or your care partner have signed paperwork which will be entered into your electronic medical record.  These signatures attest to the fact that that the information above on your After Visit Summary has been reviewed and is understood.  Full responsibility of the confidentiality of this discharge information lies with you and/or your care-partner.  ?

## 2021-06-07 NOTE — Progress Notes (Signed)
Pt's states no medical or surgical changes since previsit or office visit. 

## 2021-06-07 NOTE — Progress Notes (Signed)
To pacu, VSS. Report to rn.tb °

## 2021-06-09 ENCOUNTER — Telehealth: Payer: Self-pay

## 2021-06-09 ENCOUNTER — Telehealth: Payer: Self-pay | Admitting: *Deleted

## 2021-06-09 NOTE — Telephone Encounter (Signed)
Called [phone] and left a message we tried to reach pt for a follow up call. maw  ?

## 2021-06-09 NOTE — Telephone Encounter (Signed)
Attempted to call patient for their post-procedure follow-up call. No answer. ? ?

## 2021-06-14 ENCOUNTER — Encounter: Payer: Self-pay | Admitting: Gastroenterology

## 2021-07-26 ENCOUNTER — Other Ambulatory Visit (HOSPITAL_COMMUNITY): Payer: Self-pay

## 2021-08-04 ENCOUNTER — Ambulatory Visit (HOSPITAL_COMMUNITY)
Admission: EM | Admit: 2021-08-04 | Discharge: 2021-08-04 | Disposition: A | Discharge status: Home / Self Care | Payer: BC Managed Care – PPO | Attending: Physician Assistant | Admitting: Physician Assistant

## 2021-08-04 ENCOUNTER — Encounter (HOSPITAL_COMMUNITY): Payer: Self-pay

## 2021-08-04 ENCOUNTER — Other Ambulatory Visit (HOSPITAL_COMMUNITY): Payer: Self-pay

## 2021-08-04 DIAGNOSIS — J069 Acute upper respiratory infection, unspecified: Secondary | ICD-10-CM | POA: Diagnosis not present

## 2021-08-04 DIAGNOSIS — J301 Allergic rhinitis due to pollen: Secondary | ICD-10-CM | POA: Diagnosis not present

## 2021-08-04 DIAGNOSIS — R051 Acute cough: Secondary | ICD-10-CM

## 2021-08-04 DIAGNOSIS — R0981 Nasal congestion: Secondary | ICD-10-CM

## 2021-08-04 MED ORDER — MOMETASONE FUROATE 50 MCG/ACT NA SUSP
2.0000 | Freq: Every day | NASAL | 12 refills | Status: AC
  Filled 2021-08-04: qty 17, 30d supply, fill #0

## 2021-08-04 MED ORDER — LEVOCETIRIZINE DIHYDROCHLORIDE 5 MG PO TABS
5.0000 mg | ORAL_TABLET | Freq: Every evening | ORAL | 3 refills | Status: AC
  Filled 2021-08-04: qty 30, 30d supply, fill #0

## 2021-08-04 MED ORDER — PREDNISONE 10 MG PO TABS
10.0000 mg | ORAL_TABLET | Freq: Three times a day (TID) | ORAL | 0 refills | Status: AC
  Filled 2021-08-04: qty 15, 5d supply, fill #0

## 2021-08-04 NOTE — ED Provider Notes (Signed)
?Collin Lopez ? ? ? ?CSN: [id number] ?Arrival date & time: 08/04/21  1204 ? ? ?  ? ?History   ?Chief Complaint ?Chief Complaint  ?Patient presents with  ? Nasal Congestion  ? ? ?HPI ?Collin Lopez is a 56 y.o. male.  ? ?Patient would like43 year old male presents with problems with allergies.  For the past 2 months he has had persistent problems with his allergies, nasal congestion, sinus congestion, postnasal drip, mainly clear production.  Patient relates he has been using some over-the-counter allergy medicine which has not helped.  He does have Astelin nasal spray which she has been using but it also has not improved his symptoms.  Relates that he is got somewhat sinus and nasal congestion that his eyes have started itching a lot and his foot started tearing.  Patient relates he has not had any fever, chills, mild cough but without production and no wheezing or shortness of breath.  Patient desires to try to switch his medicine to see if and get his allergies under control.  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 ? ? ? ?Past Medical History:  ?Diagnosis Date  ? Abnormality of gait   ? Allergy   ? seasonal  ? Anemia   ? " many years ago but fine now".  ? Arthritis   ? shoulder left  ? Asthma   ? Congenital pes planus   ? Diabetes mellitus without complication ([name])   ? diagnosed 2015  ? Esophageal reflux   ? Hyperlipidemia   ? Irregular heart beat   ? "skipping beats few years ago, i am find now".  ? OSA on CPAP   ? no longer waer updated 05/24/21  ? Pain in joint, lower leg   ? Sarcoidosis   ? Sleep apnea   ? uses cpap  ? Tear of medial cartilage or meniscus of knee, current   ? Unspecified essential hypertension   ? ? ?Patient Active Problem List  ? Diagnosis Date Noted  ? Pain of left hand 06/20/2018  ? Sciatica of right side 12/27/2017  ? Hip pain 05/18/2017   ? Elevated serum creatinine 11/17/2016  ? Vitamin D insufficiency 11/03/2014  ? Erectile dysfunction 05/09/2014  ? Pain in joint of left shoulder 04/18/2014  ? Healthcare maintenance 12/28/2013  ? Diabetes mellitus type II, controlled ([place]) 02/22/2013  ? Hyperlipidemia 07/30/2012  ? Allergic rhinitis 07/27/2012  ? Pulmonary Sarcoidosis 06/22/2009  ? Essential hypertension 06/22/2009  ? GERD 06/22/2009  ? Knee pain, chronic 01/15/2009  ? PES PLANUS, CONGENITAL 01/15/2009  ? ? ?Past Surgical History:  ?Procedure Laterality Date  ? KNEE SURGERY    ? left knee repair for medical meniscus tear  ? NOSE SURGERY  1985  ? ? ? ? ? ?Home Medications   ? ?Prior to Admission medications   ?Medication Sig Start Date End Date Taking? Authorizing Provider  ?levocetirizine (XYZAL) 5 MG tablet Take 1 tablet (5 mg total) by mouth every evening. 08/04/21  Yes Nyoka Lint, PA-C  ?mometasone (NASONEX) 50 MCG/ACT nasal spray Place 2 sprays into the nose daily. 08/04/21  Yes Nyoka Lint, PA-C  ?predniSONE (DELTASONE) 10 MG tablet Take 1 tablet (10 mg total) by mouth with breakfast, with lunch, and with evening meal. 08/04/21  Yes Nyoka Lint, PA-C  ?acetaminophen (TYLENOL) 500 MG tablet  Take 2 tablets (1,000 mg total) by mouth every 8 (eight) hours as needed. 02/01/18   [name], MD  ?albuterol [facility] HFA) 108 (90 BASE) MCG/ACT inhaler Inhale 2 puffs into the lungs every 6 (six) hours as needed for wheezing or shortness of breath. For shortness of breath ?Patient not taking: Reported on 05/24/2021 04/16/14   [name], MD  ?aspirin 81 MG tablet Take 1 tablet (81 mg total) by mouth daily. 07/27/12   [name], MD  ?azelastine (ASTELIN) 0.1 % nasal spray Place 2 sprays into both nostrils 2 (two) times daily. 09/25/19   [name], MD  ?hydrochlorothiazide (HYDRODIURIL) 25 MG tablet Take 1 tablet (25 mg total) by mouth daily. 01/19/21   [name], MD  ?Loratadine 10 MG CAPS Take 1 capsule (10 mg total) by  mouth daily. 08/05/19   [name], MD  ?losartan (COZAAR) 100 MG tablet Take 1 tablet (100 mg total) by mouth daily. 08/27/20   [name], MD  ?oxymetazoline (AFRIN) 0.05 % nasal spray Place 1 spray into both nostrils 2 (two) times daily. 07/26/17   [name], [name], PA-C  ?rosuvastatin (CRESTOR) 20 MG tablet Take 1 tablet (20 mg total) by mouth daily. 08/28/20   [name], MD  ? ? ?Family History ?Family History  ?Problem Relation Age of Onset  ? Breast cancer Mother   ? Cancer Mother   ?     breast cancer passed away in 2009/06/16.  ? Heart disease Father   ?     Died 3 years ago due to CHF and refused pacemaker.  ? Heart attack Brother   ? Colon cancer Neg Hx   ? Rectal cancer Neg Hx   ? Stomach cancer Neg Hx   ? Colon polyps Neg Hx   ? Esophageal cancer Neg Hx   ? ? ?Social History ?Social History  ? ?Tobacco Use  ? Smoking status: Former  ?  Packs/day: 0.20  ?  Years: 3.00  ?  Pack years: 0.60  ?  Types: Cigarettes  ?  Quit date: 03/21/1993  ?  Years since quitting: 28.3  ?  Passive exposure: Never  ? Smokeless tobacco: Never  ? Tobacco comments:  ?  1ppd x 2 years  ?Vaping Use  ? Vaping Use: Never used  ?Substance Use Topics  ? Alcohol use: No  ?  Alcohol/week: 0.0 standard drinks  ? Drug use: No  ? ? ? ?Allergies   ?Tramadol ? ? ?Review of Systems ?Review of Systems  ?HENT:  Positive for postnasal drip, rhinorrhea and sinus pressure.   ?Eyes:  Positive for itching.  ? ? ?Physical Exam ?Triage Vital Signs ?ED Triage Vitals  ?Enc Vitals Group  ?   BP 08/04/21 1356 (!) 154/91  ?   Pulse Rate 08/04/21 1356 79  ?   Resp 08/04/21 1356 18  ?   Temp 08/04/21 1356 98.1 ?F (36.7 ?C)  ?   Temp Source 08/04/21 1356 Oral  ?   SpO2 08/04/21 1356 96 %  ?   Weight --   ?   Height --   ?   Head Circumference --   ?   Peak Flow --   ?   Pain Score 08/04/21 1357 8  ?   Pain Loc --   ?   Pain Edu? --   ?   Excl. in [place]? --   ? ?No data found. ? ?Updated Vital Signs ?BP (!) 154/91 (BP Location: Left  Arm)   Pulse 79   Temp  98.1 ?F (36.7 ?C) (Oral)   Resp 18   SpO2 96%  ? ?Visual Acuity ?Right Eye Distance:   ?Left Eye Distance:   ?Bilateral Distance:   ? ?Right Eye Near:   ?Left Eye Near:    ?Bilateral Near:    ? ?Physical Exam ?Constitutional:   ?   Appearance: Normal appearance.  ?HENT:  ?   Right Ear: Tympanic membrane is injected.  ?   Left Ear: Tympanic membrane is injected.  ?   Mouth/Throat:  ?   Mouth: Mucous membranes are moist.  ?   Pharynx: Oropharynx is clear.  ?Cardiovascular:  ?   Rate and Rhythm: Normal rate and regular rhythm.  ?   Heart sounds: Normal heart sounds.  ?Pulmonary:  ?   Effort: Pulmonary effort is normal.  ?   Breath sounds: Normal breath sounds and air entry. No wheezing, rhonchi or rales.  ?Neurological:  ?   Mental Status: He is alert.  ? ? ? ?UC Treatments / Results  ?Labs ?(all labs ordered are listed, but only abnormal results are displayed) ?Labs Reviewed - No data to display ? ?EKG ? ? ?Radiology ?No results found. ? ?Procedures ?Procedures (including critical care time) ? ?Medications Ordered in UC ?Medications - No data to display ? ?Initial Impression / Assessment and Plan / UC Course  ?I have reviewed the triage vital signs and the nursing notes. ? ?Pertinent labs & imaging results that were available during my care of the patient were reviewed by me and considered in my medical decision making (see chart for details). ? ?  ?Plan: ?Advised patient take the nasal spray as directed. ?Advised to take the Xyzal 1 daily. ?Increase fluid intake over the next 5 to 7 days. ?And follow-up with PCP or return if symptoms fail to improve. ?Final Clinical Impressions(s) / UC Diagnoses  ? ?Final diagnoses:  ?Nasal congestion  ?Seasonal allergic rhinitis due to pollen  ?Acute upper respiratory infection  ?Acute cough  ? ? ? ?Discharge Instructions   ? ?  ?Advised to continue with the allergy medication and the nasal spray on a regular basis. ?Vies to increase fluid intake for the duration of  medications. ? ? ? ?ED Prescriptions   ? ? Medication Sig Dispense Auth. Provider  ? levocetirizine (XYZAL) 5 MG tablet Take 1 tablet (5 mg total) by mouth every evening. 30 tablet Nyoka Lint, PA-C  ? mometasone (NASONEX

## 2021-08-04 NOTE — Discharge Instructions (Signed)
Advised to continue with the allergy medication and the nasal spray on a regular basis. ?Vies to increase fluid intake for the duration of medications. ?

## 2021-08-04 NOTE — ED Triage Notes (Signed)
Pt c/o allergies for the past 2wks with no relief with OTC meds. C/o nasal congestion, itchy ears, scratchy throat, and cough. ?

## 2022-02-28 IMAGING — CR DG SHOULDER 2+V*L*
3 series · 3 of 3 positions shown · non-contrast
Comparison: None

CLINICAL DATA: Chronic anterior and posterior LEFT shoulder pain,
no known injury

EXAM:
LEFT SHOULDER - 2+ VIEW

[w shoulder ap internal left *]
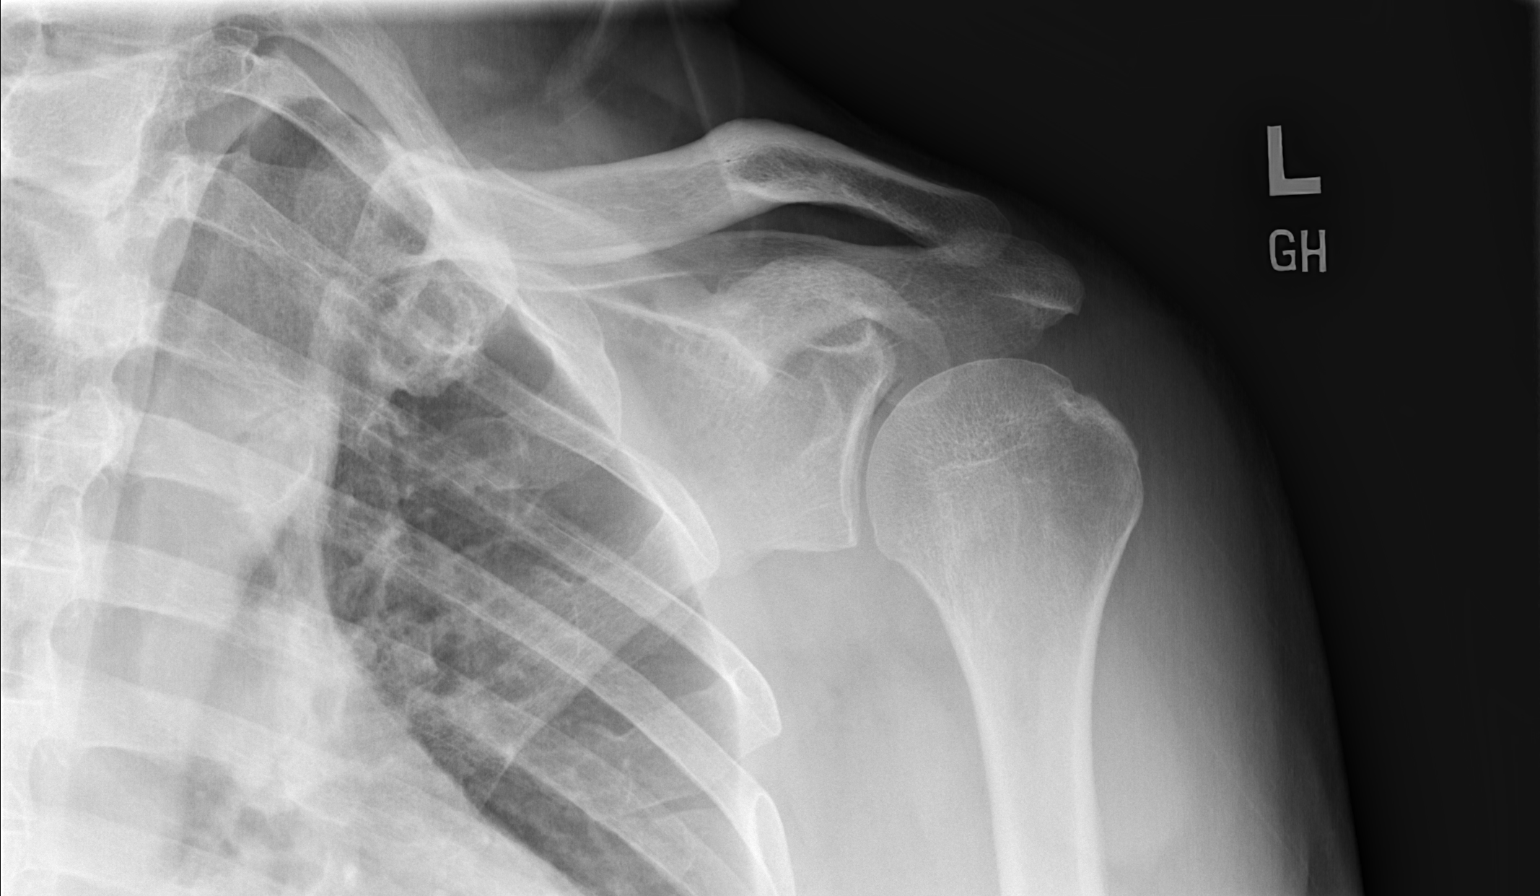

[w shoulder y view left *]
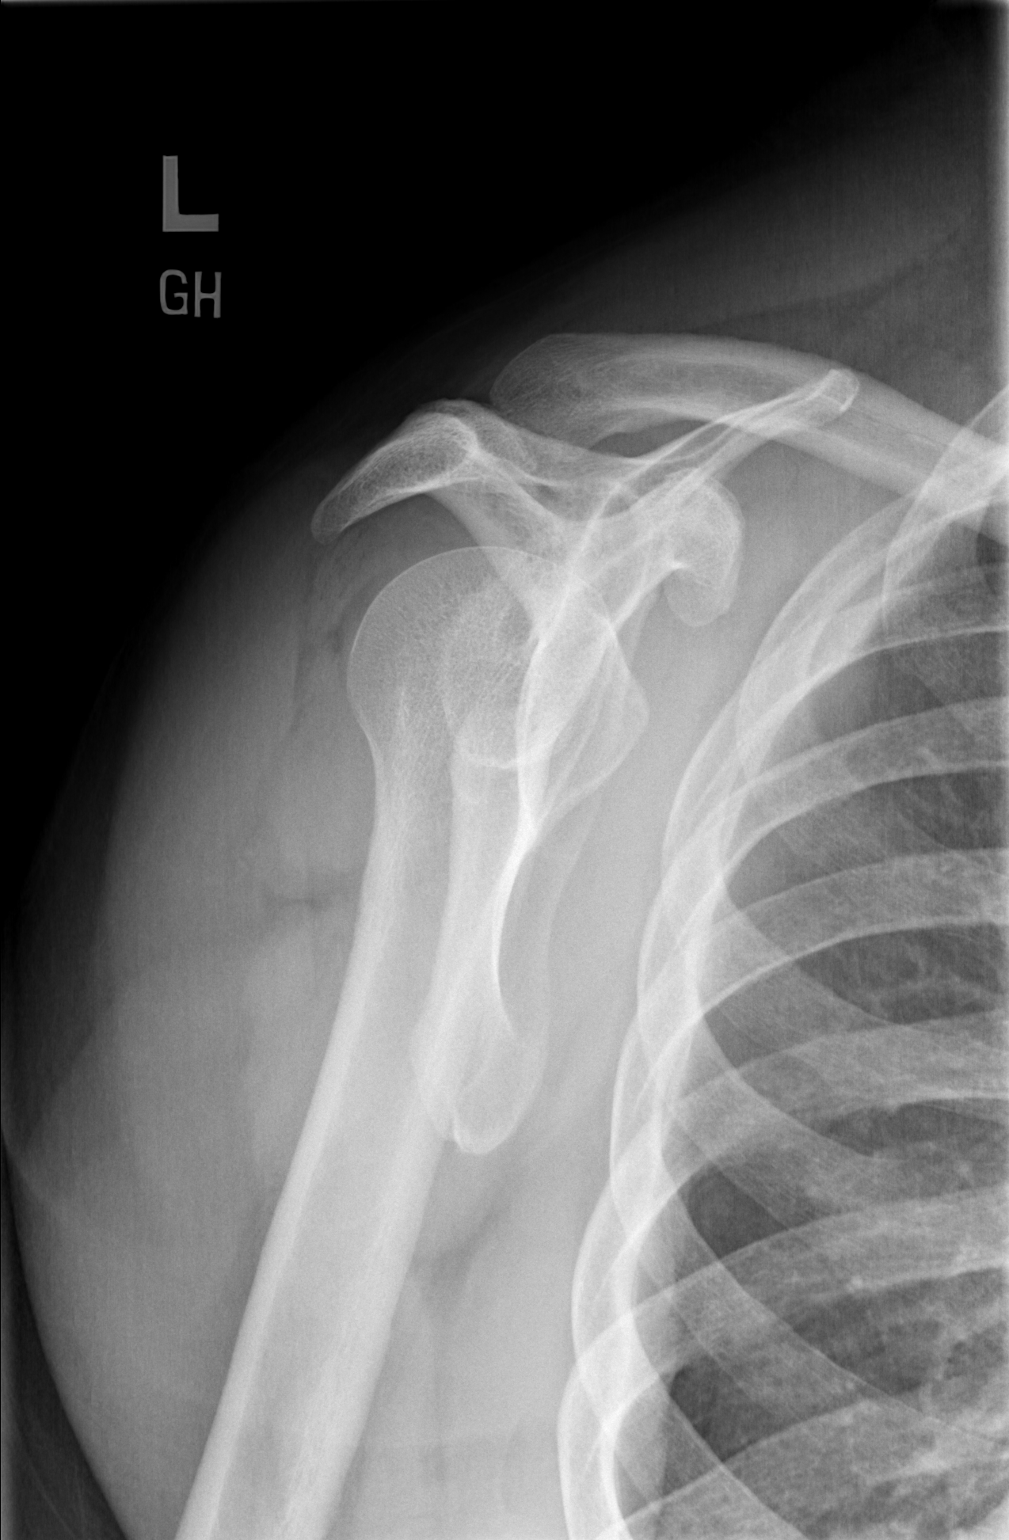

[w shoulder axillary left *]
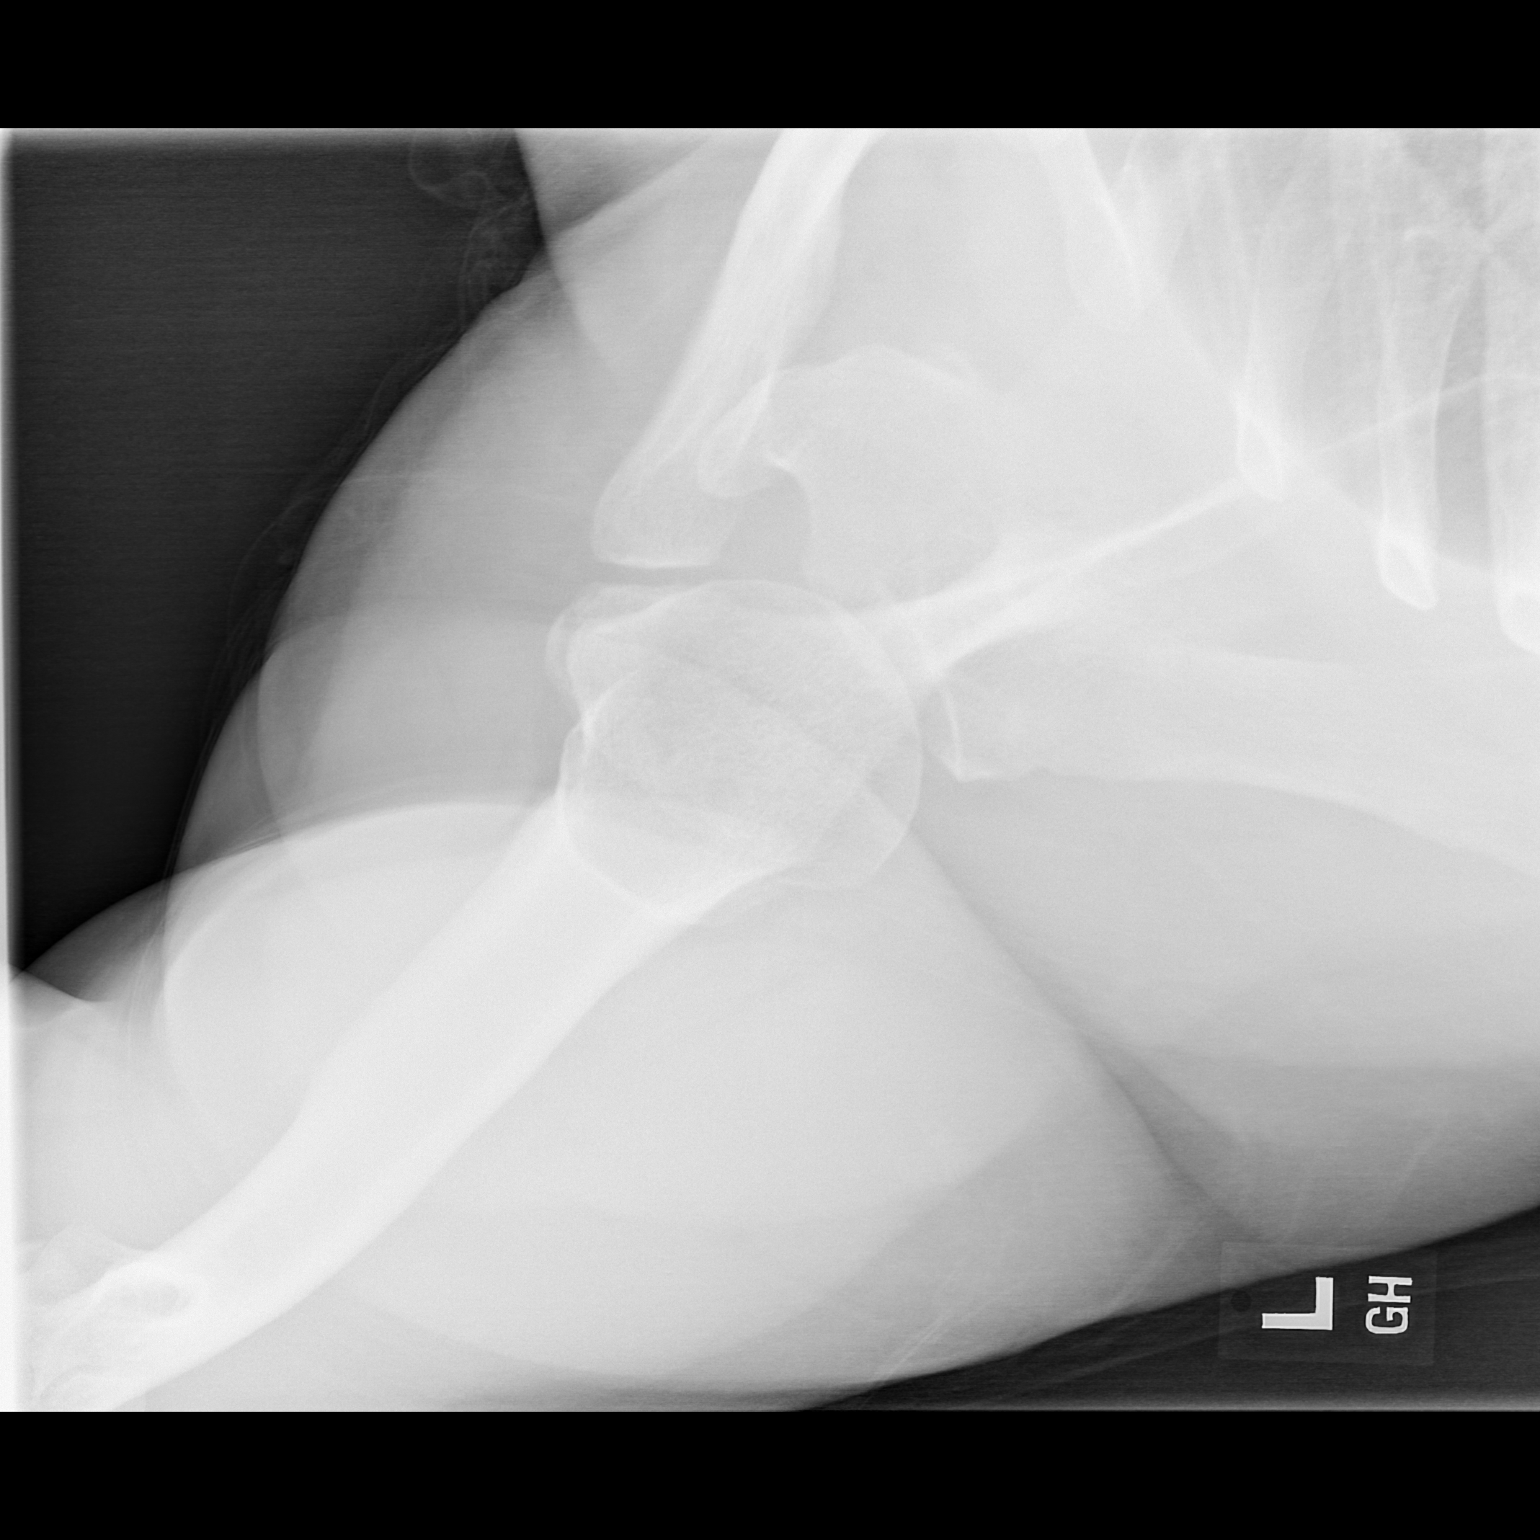

[3 of 3 positions shown; findings below may reference images not displayed]

FINDINGS: Osseous mineralization normal.

AC joint alignment normal.

No acute fracture, dislocation or bone destruction.

Visualized ribs unremarkable.
IMPRESSION: Normal exam.
